# Patient Record
Sex: Female | Born: 1980
Health system: Southern US, Community
[De-identification: ages and names within clinical notes are randomized; demographics above are authoritative.]

## PROBLEM LIST (undated history)

## (undated) DIAGNOSIS — F419 Anxiety disorder, unspecified: Secondary | ICD-10-CM

## (undated) DIAGNOSIS — Z87442 Personal history of urinary calculi: Secondary | ICD-10-CM

## (undated) DIAGNOSIS — J45909 Unspecified asthma, uncomplicated: Secondary | ICD-10-CM

## (undated) DIAGNOSIS — R51 Headache: Secondary | ICD-10-CM

## (undated) DIAGNOSIS — R519 Headache, unspecified: Secondary | ICD-10-CM

## (undated) DIAGNOSIS — F988 Other specified behavioral and emotional disorders with onset usually occurring in childhood and adolescence: Secondary | ICD-10-CM

## (undated) DIAGNOSIS — N926 Irregular menstruation, unspecified: Secondary | ICD-10-CM

## (undated) DIAGNOSIS — D649 Anemia, unspecified: Secondary | ICD-10-CM

## (undated) HISTORY — DX: Other specified behavioral and emotional disorders with onset usually occurring in childhood and adolescence: F98.8

## (undated) HISTORY — DX: Irregular menstruation, unspecified: N92.6

## (undated) HISTORY — PX: ABDOMINAL HYSTERECTOMY: SHX81

## (undated) HISTORY — DX: Anemia, unspecified: D64.9

---

## 2003-12-16 ENCOUNTER — Observation Stay: Payer: Self-pay | Admitting: Obstetrics & Gynecology

## 2003-12-23 ENCOUNTER — Observation Stay: Payer: Self-pay

## 2003-12-23 ENCOUNTER — Inpatient Hospital Stay: Payer: Self-pay

## 2003-12-24 HISTORY — PX: TUBAL LIGATION: SHX77

## 2006-12-24 ENCOUNTER — Emergency Department: Payer: Self-pay | Admitting: Emergency Medicine

## 2010-01-17 ENCOUNTER — Inpatient Hospital Stay: Payer: Self-pay | Admitting: Internal Medicine

## 2011-07-17 ENCOUNTER — Ambulatory Visit: Payer: Self-pay

## 2012-04-03 LAB — HM PAP SMEAR

## 2013-02-06 ENCOUNTER — Emergency Department: Payer: Self-pay | Admitting: Emergency Medicine

## 2013-02-06 LAB — URINALYSIS, COMPLETE
Bilirubin,UR: NEGATIVE
Glucose,UR: NEGATIVE mg/dL (ref 0–75)
Ketone: NEGATIVE
Nitrite: POSITIVE
Ph: 6 (ref 4.5–8.0)
Squamous Epithelial: 5
WBC UR: 589 /HPF (ref 0–5)

## 2013-02-06 LAB — CBC
HCT: 37.1 % (ref 35.0–47.0)
MCH: 29.6 pg (ref 26.0–34.0)
MCHC: 33 g/dL (ref 32.0–36.0)
MCV: 90 fL (ref 80–100)
Platelet: 287 10*3/uL (ref 150–440)
RDW: 12.5 % (ref 11.5–14.5)
WBC: 14.7 10*3/uL — ABNORMAL HIGH (ref 3.6–11.0)

## 2013-02-06 LAB — COMPREHENSIVE METABOLIC PANEL
Albumin: 3.8 g/dL (ref 3.4–5.0)
Anion Gap: 4 — ABNORMAL LOW (ref 7–16)
BUN: 10 mg/dL (ref 7–18)
Calcium, Total: 9.4 mg/dL (ref 8.5–10.1)
Chloride: 105 mmol/L (ref 98–107)
Creatinine: 0.84 mg/dL (ref 0.60–1.30)
Glucose: 103 mg/dL — ABNORMAL HIGH (ref 65–99)
Osmolality: 271 (ref 275–301)
Potassium: 3.4 mmol/L — ABNORMAL LOW (ref 3.5–5.1)
SGOT(AST): 16 U/L (ref 15–37)
SGPT (ALT): 13 U/L (ref 12–78)
Sodium: 136 mmol/L (ref 136–145)
Total Protein: 7.5 g/dL (ref 6.4–8.2)

## 2013-02-09 LAB — URINE CULTURE

## 2013-12-10 ENCOUNTER — Ambulatory Visit: Payer: Self-pay | Admitting: Emergency Medicine

## 2014-10-19 ENCOUNTER — Other Ambulatory Visit: Payer: Self-pay | Admitting: Family Medicine

## 2014-11-12 ENCOUNTER — Other Ambulatory Visit: Payer: Self-pay | Admitting: Family Medicine

## 2014-12-16 ENCOUNTER — Other Ambulatory Visit: Payer: Self-pay | Admitting: Family Medicine

## 2015-01-28 ENCOUNTER — Other Ambulatory Visit: Payer: Self-pay | Admitting: Family Medicine

## 2015-02-10 ENCOUNTER — Encounter: Payer: Self-pay | Admitting: Family Medicine

## 2015-02-10 ENCOUNTER — Ambulatory Visit (INDEPENDENT_AMBULATORY_CARE_PROVIDER_SITE_OTHER): Payer: BLUE CROSS/BLUE SHIELD | Admitting: Family Medicine

## 2015-02-10 VITALS — BP 108/73 | HR 82 | Temp 98.7°F | Ht 66.0 in | Wt 131.0 lb

## 2015-02-10 DIAGNOSIS — R8299 Other abnormal findings in urine: Secondary | ICD-10-CM | POA: Diagnosis not present

## 2015-02-10 DIAGNOSIS — N898 Other specified noninflammatory disorders of vagina: Secondary | ICD-10-CM | POA: Diagnosis not present

## 2015-02-10 DIAGNOSIS — J45909 Unspecified asthma, uncomplicated: Secondary | ICD-10-CM | POA: Diagnosis not present

## 2015-02-10 DIAGNOSIS — R8290 Unspecified abnormal findings in urine: Secondary | ICD-10-CM

## 2015-02-10 LAB — UA/M W/RFLX CULTURE, ROUTINE
BILIRUBIN UA: NEGATIVE
Glucose, UA: NEGATIVE
Ketones, UA: NEGATIVE
Leukocytes, UA: NEGATIVE
NITRITE UA: NEGATIVE
PH UA: 5 (ref 5.0–7.5)
Protein, UA: NEGATIVE
RBC UA: NEGATIVE
SPEC GRAV UA: 1.025 (ref 1.005–1.030)
UUROB: 0.2 mg/dL (ref 0.2–1.0)

## 2015-02-10 MED ORDER — ALBUTEROL SULFATE HFA 108 (90 BASE) MCG/ACT IN AERS: INHALATION_SPRAY | RESPIRATORY_TRACT | Status: DC

## 2015-02-10 MED ORDER — TIOTROPIUM BROMIDE MONOHYDRATE 18 MCG IN CAPS: 18.0000 ug | ORAL_CAPSULE | Freq: Every day | RESPIRATORY_TRACT | Status: DC

## 2015-02-10 NOTE — Assessment & Plan Note (Signed)
Will stop advair as it didn't help. Start spiriva. Continue proair. Recheck 1 month.

## 2015-02-10 NOTE — Progress Notes (Signed)
BP 108/73 mmHg  Pulse 82  Temp(Src) 98.7 F (37.1 C)  Ht 5\' 6"  (1.676 m)  Wt 131 lb (59.421 kg)  BMI 21.15 kg/m2  SpO2 99%  LMP 01/20/2015   Subjective:    Patient ID: Sheila Benton, female    DOB: 1980/08/08, 34 y.o.   MRN: [id number]  HPI: Sheila Benton is a 34 y.o. female  Chief Complaint  Patient presents with  . Follow-up    Still having difficulty breathing  . Other    Possible bladder infection. No dysuria. Urine just is very cloudy.    URINARY SYMPTOMS Duration: couple of months Dysuria: no Urinary frequency: no Urgency: no Small volume voids: no Symptom severity: none Urinary incontinence: no Foul odor: yes Hematuria: no Abdominal pain: no Back pain: yes- occasionally Suprapubic pain/pressure: no Flank pain: no Fever:  no Vomiting: no Relief with cranberry juice: no Relief with pyridium: no Status: stable Previous urinary tract infection: no Recurrent urinary tract infection: no Sexual activity: monogomous History of sexually transmitted disease: no Vaginal discharge: yes Treatments attempted: none   ASTHMA Asthma status: worse Satisfied with current treatment?: no Albuterol/rescue inhaler frequency: nightly, and in AM Dyspnea frequency: several times throughout the day Wheezing frequency: never Cough frequency: never Nocturnal symptom frequency: nightly Limitation of activity: no Current upper respiratory symptoms: no Triggers: laying down at night, dust Last Spirometry: Today Failed/intolerant to following asthma meds: advair Asthma meds in past: advair, proair Aerochamber/spacer use: no Visits to ER or Urgent Care in past year: no Pneumovax: Refused Influenza: Refused  Relevant past medical, surgical, family and social history reviewed and updated as indicated. Interim medical history since our last visit reviewed. Allergies and medications reviewed and updated.  Review of Systems  Constitutional: Negative.   Respiratory: Negative.    Cardiovascular: Negative.   Musculoskeletal: Negative.   Psychiatric/Behavioral: Negative.     Per HPI unless specifically indicated above     Objective:    BP 108/73 mmHg  Pulse 82  Temp(Src) 98.7 F (37.1 C)  Ht 5\' 6"  (1.676 m)  Wt 131 lb (59.421 kg)  BMI 21.15 kg/m2  SpO2 99%  LMP 01/20/2015  Wt Readings from Last 3 Encounters:  02/10/15 131 lb (59.421 kg)    Physical Exam  Constitutional: She is oriented to person, place, and time. She appears well-developed and well-nourished. No distress.  HENT:  Head: Normocephalic and atraumatic.  Right Ear: Hearing and external ear normal.  Left Ear: Hearing and external ear normal.  Nose: Nose normal.  Mouth/Throat: Oropharynx is clear and moist. No oropharyngeal exudate.  Eyes: Conjunctivae, EOM and lids are normal. Pupils are equal, round, and reactive to light. Right eye exhibits no discharge. Left eye exhibits no discharge. No scleral icterus.  Cardiovascular: Normal rate, regular rhythm, normal heart sounds and intact distal pulses.  Exam reveals no gallop and no friction rub.   No murmur heard. Pulmonary/Chest: Effort normal and breath sounds normal. No respiratory distress. She has no wheezes. She has no rales. She exhibits no tenderness.  Musculoskeletal: Normal range of motion.  Neurological: She is alert and oriented to person, place, and time.  Skin: Skin is warm, dry and intact. No rash noted. She is not diaphoretic. No erythema. No pallor.  Psychiatric: She has a normal mood and affect. Her speech is normal and behavior is normal. Judgment and thought content normal. Cognition and memory are normal.  Nursing note and vitals reviewed.   Results for orders placed or performed in visit on  02/10/15  UA/M w/rflx Culture, Routine  Result Value Ref Range   Specific Gravity, UA 1.025 1.005 - 1.030   pH, UA 5.0 5.0 - 7.5   Color, UA Yellow Yellow   Appearance Ur Cloudy (A) Clear   Leukocytes, UA Negative Negative    Protein, UA Negative Negative/Trace   Glucose, UA Negative Negative   Ketones, UA Negative Negative   RBC, UA Negative Negative   Bilirubin, UA Negative Negative   Urobilinogen, Ur 0.2 0.2 - 1.0 mg/dL   Nitrite, UA Negative Negative      Assessment & Plan:   Problem List Items Addressed This Visit      Respiratory   Asthma - Primary    Will stop advair as it didn't help. Start spiriva. Continue proair. Recheck 1 month.       Relevant Medications   tiotropium (SPIRIVA HANDIHALER) 18 MCG inhalation capsule   albuterol (PROAIR HFA) 108 (90 BASE) MCG/ACT inhaler   Other Relevant Orders   Spirometry with Graph (Completed)    Other Visit Diagnoses    Cloudy urine        Negative UA, only in the AM, likely due to dehydration. Monitor. Increase fluids. Call if not better or worse.     Relevant Orders    UA/M w/rflx Culture, Routine (Completed)    Vaginal discharge        Negative wet prep likely natural fluids or possibly irritation from underwear. Monitor. Increase fluids. Call if not better or worse.     Relevant Orders    WET PREP FOR [place], YEAST, CLUE        Follow up plan: Return in about 4 weeks (around 03/10/2015) for follow up lungs.

## 2015-02-11 LAB — WET PREP FOR TRICH, YEAST, CLUE
CLUE CELL EXAM: NEGATIVE
Trichomonas Exam: NEGATIVE
YEAST EXAM: NEGATIVE

## 2015-03-10 ENCOUNTER — Encounter: Payer: Self-pay | Admitting: Family Medicine

## 2015-03-10 ENCOUNTER — Ambulatory Visit (INDEPENDENT_AMBULATORY_CARE_PROVIDER_SITE_OTHER): Payer: BLUE CROSS/BLUE SHIELD | Admitting: Family Medicine

## 2015-03-10 VITALS — BP 105/69 | HR 84 | Temp 98.9°F | Ht 66.2 in | Wt 131.0 lb

## 2015-03-10 DIAGNOSIS — F411 Generalized anxiety disorder: Secondary | ICD-10-CM | POA: Diagnosis not present

## 2015-03-10 DIAGNOSIS — J069 Acute upper respiratory infection, unspecified: Secondary | ICD-10-CM

## 2015-03-10 DIAGNOSIS — J45909 Unspecified asthma, uncomplicated: Secondary | ICD-10-CM

## 2015-03-10 DIAGNOSIS — F419 Anxiety disorder, unspecified: Secondary | ICD-10-CM | POA: Insufficient documentation

## 2015-03-10 MED ORDER — CITALOPRAM HYDROBROMIDE 20 MG PO TABS: 20.0000 mg | ORAL_TABLET | Freq: Every day | ORAL | Status: DC

## 2015-03-10 NOTE — Assessment & Plan Note (Signed)
Under better control. Continue current regimen. Spirometry next visit when she isn't sick.

## 2015-03-10 NOTE — Progress Notes (Signed)
BP 105/69 mmHg  Pulse 84  Temp(Src) 98.9 F (37.2 C)  Ht 5' 6.2" (1.681 m)  Wt 131 lb (59.421 kg)  BMI 21.03 kg/m2  SpO2 99%  LMP 02/16/2015 (Exact Date)   Subjective:    Patient ID: Sheila Benton, female    DOB: Oct 03, 1980, 35 y.o.   MRN: [id number]  HPI: Sheila Benton is a 35 y.o. female  Chief Complaint  Patient presents with  . Asthma   UPPER RESPIRATORY TRACT INFECTION Duration: 1 week Worst symptom: chest pain and congestion Fever: no Cough: yes Shortness of breath: yes Wheezing: no Chest pain: yes Chest tightness: yes Chest congestion: yes Nasal congestion: yes Runny nose: no Post nasal drip: no Sneezing: yes Sore throat: no Swollen glands: no Sinus pressure: yes Headache: yes Face pain: no Toothache: no Ear pain: no  Ear pressure: no  Eyes red/itching:yes Eye drainage/crusting: no  Vomiting: no Rash: no Fatigue: yes Sick contacts: no Strep contacts: no  Context: better Recurrent sinusitis: no Relief with OTC cold/cough medications: no  Treatments attempted: cold/sinus   ASTHMA Asthma status: better Satisfied with current treatment?: yes Albuterol/rescue inhaler frequency: less than she was, couple of times a week Dyspnea frequency: better, but still going on Wheezing frequency: none Nocturnal symptom frequency: never Limitation of activity: no Current upper respiratory symptoms: yes  ANXIETY/STRESS- had a panic attack yesterday, feels like maybe her anxiety is causing her breathing issues than she thought Duration: unsure Anxious mood: yes  Excessive worrying: yes Irritability: yes  Sweating: no Nausea: no Palpitations:yes Hyperventilation: yes Panic attacks: yes, yesterday and usually about 1x a week Agoraphobia: no  Obscessions/compulsions: yes Depressed mood: no GAD 7 : Generalized Anxiety Score 03/10/2015  Nervous, Anxious, on Edge 3  Control/stop worrying 3  Worry too much - different things 2  Trouble relaxing 2  Restless 1   Easily annoyed or irritable 3  Afraid - awful might happen 3  Total GAD 7 Score 17  Anxiety Difficulty Somewhat difficult   Anhedonia: no Weight changes: no Insomnia: no   Hypersomnia: yes Fatigue/loss of energy: yes Feelings of worthlessness: no Feelings of guilt: no Impaired concentration/indecisiveness: no Suicidal ideations: no  Crying spells: no Recent Stressors/Life Changes: yes, has been moving  Relevant past medical, surgical, family and social history reviewed and updated as indicated. Interim medical history since our last visit reviewed. Allergies and medications reviewed and updated.  Review of Systems  Constitutional: Negative.   HENT: Positive for congestion, nosebleeds, postnasal drip and rhinorrhea. Negative for dental problem, drooling, ear discharge, ear pain, facial swelling, hearing loss, mouth sores, sinus pressure, sneezing, sore throat, tinnitus, trouble swallowing and voice change.   Respiratory: Positive for cough, chest tightness and shortness of breath. Negative for wheezing and stridor.   Cardiovascular: Positive for chest pain. Negative for palpitations and leg swelling.  Psychiatric/Behavioral: Negative.     Per HPI unless specifically indicated above     Objective:    BP 105/69 mmHg  Pulse 84  Temp(Src) 98.9 F (37.2 C)  Ht 5' 6.2" (1.681 m)  Wt 131 lb (59.421 kg)  BMI 21.03 kg/m2  SpO2 99%  LMP 02/16/2015 (Exact Date)  Wt Readings from Last 3 Encounters:  03/10/15 131 lb (59.421 kg)  02/10/15 131 lb (59.421 kg)    Physical Exam  Constitutional: She is oriented to person, place, and time. She appears well-developed and well-nourished. No distress.  HENT:  Head: Normocephalic and atraumatic.  Right Ear: Hearing and external ear normal.  Left  Ear: Hearing and external ear normal.  Nose: Mucosal edema, rhinorrhea and sinus tenderness present. No nose lacerations, nasal deformity, septal deviation or nasal septal hematoma. No epistaxis.   No foreign bodies.  Mouth/Throat: Uvula is midline, oropharynx is clear and moist and mucous membranes are normal. No oropharyngeal exudate.  Swelling and mucous in L maxillary region  Eyes: Conjunctivae, EOM and lids are normal. Pupils are equal, round, and reactive to light. Right eye exhibits no discharge. Left eye exhibits no discharge. No scleral icterus.  Neck: Normal range of motion. Neck supple. No JVD present. No tracheal deviation present. No thyromegaly present.  Cardiovascular: Normal rate, regular rhythm, normal heart sounds and intact distal pulses.  Exam reveals no gallop and no friction rub.   No murmur heard. Pulmonary/Chest: Effort normal and breath sounds normal. No stridor. No respiratory distress. She has no wheezes. She has no rales. She exhibits no tenderness.  Musculoskeletal: Normal range of motion.  Lymphadenopathy:    She has cervical adenopathy.  Neurological: She is alert and oriented to person, place, and time.  Skin: Skin is warm, dry and intact. No rash noted. No erythema. No pallor.  Psychiatric: She has a normal mood and affect. Her speech is normal and behavior is normal. Judgment and thought content normal. Cognition and memory are normal.  Nursing note and vitals reviewed.   Results for orders placed or performed in visit on 02/10/15  WET PREP FOR [place], YEAST, CLUE  Result Value Ref Range   Trichomonas Exam Negative Negative   Yeast Exam Negative Negative   Clue Cell Exam Negative Negative  UA/M w/rflx Culture, Routine  Result Value Ref Range   Specific Gravity, UA 1.025 1.005 - 1.030   pH, UA 5.0 5.0 - 7.5   Color, UA Yellow Yellow   Appearance Ur Cloudy (A) Clear   Leukocytes, UA Negative Negative   Protein, UA Negative Negative/Trace   Glucose, UA Negative Negative   Ketones, UA Negative Negative   RBC, UA Negative Negative   Bilirubin, UA Negative Negative   Urobilinogen, Ur 0.2 0.2 - 1.0 mg/dL   Nitrite, UA Negative Negative       Assessment & Plan:   Problem List Items Addressed This Visit      Respiratory   Asthma - Primary    Under better control. Continue current regimen. Spirometry next visit when she isn't sick.         Other   Anxiety disorder    Didn't do well with zoloft in the past. Will treat with celexa. Discussed risks/benefits and side effects today. Recheck in 1 month. Call with any problems       Other Visit Diagnoses    Upper respiratory infection        Continue symptomatic treatment, if not better by middle of next week will treat. Continue to monitor.         Follow up plan: Return in about 4 weeks (around 04/07/2015) for Anxiety follow up and spiro.

## 2015-03-10 NOTE — Assessment & Plan Note (Signed)
Didn't do well with zoloft in the past. Will treat with celexa. Discussed risks/benefits and side effects today. Recheck in 1 month. Call with any problems

## 2015-03-30 ENCOUNTER — Other Ambulatory Visit: Payer: Self-pay | Admitting: Family Medicine

## 2015-04-11 ENCOUNTER — Encounter: Payer: Self-pay | Admitting: Family Medicine

## 2015-04-11 ENCOUNTER — Ambulatory Visit (INDEPENDENT_AMBULATORY_CARE_PROVIDER_SITE_OTHER): Payer: BLUE CROSS/BLUE SHIELD | Admitting: Family Medicine

## 2015-04-11 VITALS — BP 115/75 | HR 80 | Temp 98.6°F | Ht 65.3 in | Wt 124.0 lb

## 2015-04-11 DIAGNOSIS — N3001 Acute cystitis with hematuria: Secondary | ICD-10-CM | POA: Diagnosis not present

## 2015-04-11 DIAGNOSIS — J4520 Mild intermittent asthma, uncomplicated: Secondary | ICD-10-CM

## 2015-04-11 DIAGNOSIS — R1011 Right upper quadrant pain: Secondary | ICD-10-CM

## 2015-04-11 MED ORDER — ALBUTEROL SULFATE HFA 108 (90 BASE) MCG/ACT IN AERS: 1.0000 | INHALATION_SPRAY | RESPIRATORY_TRACT | Status: DC | PRN

## 2015-04-11 MED ORDER — CIPROFLOXACIN HCL 500 MG PO TABS: 500.0000 mg | ORAL_TABLET | Freq: Two times a day (BID) | ORAL | Status: DC

## 2015-04-11 NOTE — Assessment & Plan Note (Signed)
Under good control. Spiriva too expensive, will try Bevespi. Sample given today. If she likes it, she will call and we will send in Rx.

## 2015-04-11 NOTE — Progress Notes (Signed)
BP 115/75 mmHg  Pulse 80  Temp(Src) 98.6 F (37 C)  Ht 5' 5.3" (1.659 m)  Wt 124 lb (56.246 kg)  BMI 20.44 kg/m2  SpO2 98%  LMP 04/08/2015 (Exact Date)   Subjective:    Patient ID: Sheila Benton, female    DOB: 1980/10/25, 36 y.o.   MRN: [id number]  HPI: Sheila Benton is a 35 y.o. female  Chief Complaint  Patient presents with  . Asthma  . Abdominal Pain    right side   ASTHMA Asthma status: better Satisfied with current treatment?: yes Albuterol/rescue inhaler frequency: couple times a day Dyspnea frequency: at night Wheezing frequency: couple of times a day Cough frequency: mainly at night Nocturnal symptom frequency: few times a week Limitation of activity: no Current upper respiratory symptoms: no Triggers: at night Last Spirometry: today  Failed/intolerant to following asthma meds: advair  ABDOMINAL PAIN  Duration: last night and again today Onset: sudden Severity: severe Quality: sharp and stabbing Location:  RUQ  Episode duration: 5 minutes Radiation: no Frequency: 2x in the past day Alleviating factors:  Aggravating factors: Status: better Treatments attempted: none Fever: no Nausea: no Vomiting: no Weight loss: no Decreased appetite: no Diarrhea: yes- last week Constipation: no Blood in stool: no Heartburn: no Jaundice: no Rash: no Dysuria/urinary frequency: no Hematuria: no History of sexually transmitted disease: no Recurrent NSAID use: no   Relevant past medical, surgical, family and social history reviewed and updated as indicated. Interim medical history since our last visit reviewed. Allergies and medications reviewed and updated.  Review of Systems  Constitutional: Negative.   HENT: Negative.   Respiratory: Negative.   Cardiovascular: Negative.   Gastrointestinal: Positive for abdominal pain and diarrhea. Negative for nausea, vomiting, constipation, blood in stool, abdominal distention, anal bleeding and rectal pain.   Genitourinary: Negative.   Musculoskeletal: Positive for back pain. Negative for myalgias, joint swelling, arthralgias, gait problem, neck pain and neck stiffness.  Psychiatric/Behavioral: Negative.     Per HPI unless specifically indicated above     Objective:    BP 115/75 mmHg  Pulse 80  Temp(Src) 98.6 F (37 C)  Ht 5' 5.3" (1.659 m)  Wt 124 lb (56.246 kg)  BMI 20.44 kg/m2  SpO2 98%  LMP 04/08/2015 (Exact Date)  Wt Readings from Last 3 Encounters:  04/11/15 124 lb (56.246 kg)  03/10/15 131 lb (59.421 kg)  02/10/15 131 lb (59.421 kg)    Physical Exam  Constitutional: She is oriented to person, place, and time. She appears well-developed and well-nourished. No distress.  HENT:  Head: Normocephalic and atraumatic.  Right Ear: Hearing normal.  Left Ear: Hearing normal.  Nose: Nose normal.  Eyes: Conjunctivae and lids are normal. Right eye exhibits no discharge. Left eye exhibits no discharge. No scleral icterus.  Cardiovascular: Normal rate, regular rhythm, normal heart sounds and intact distal pulses.  Exam reveals no gallop and no friction rub.   No murmur heard. Pulmonary/Chest: Effort normal and breath sounds normal. No respiratory distress. She has no wheezes. She has no rales. She exhibits no tenderness.  Abdominal: Soft. Bowel sounds are normal. She exhibits no distension and no mass. There is no hepatosplenomegaly. There is tenderness in the right upper quadrant. There is positive Murphy's sign. There is no rigidity, no rebound, no guarding, no CVA tenderness and no tenderness at McBurney's point.  Musculoskeletal: Normal range of motion.  Neurological: She is alert and oriented to person, place, and time.  Skin: Skin is warm, dry and intact.  No rash noted. No erythema. No pallor.  Psychiatric: She has a normal mood and affect. Her speech is normal and behavior is normal. Judgment and thought content normal. Cognition and memory are normal.  Nursing note and vitals  reviewed.   Results for orders placed or performed in visit on 02/10/15  WET PREP FOR [place], YEAST, CLUE  Result Value Ref Range   Trichomonas Exam Negative Negative   Yeast Exam Negative Negative   Clue Cell Exam Negative Negative  UA/M w/rflx Culture, Routine  Result Value Ref Range   Specific Gravity, UA 1.025 1.005 - 1.030   pH, UA 5.0 5.0 - 7.5   Color, UA Yellow Yellow   Appearance Ur Cloudy (A) Clear   Leukocytes, UA Negative Negative   Protein, UA Negative Negative/Trace   Glucose, UA Negative Negative   Ketones, UA Negative Negative   RBC, UA Negative Negative   Bilirubin, UA Negative Negative   Urobilinogen, Ur 0.2 0.2 - 1.0 mg/dL   Nitrite, UA Negative Negative      Assessment & Plan:   Problem List Items Addressed This Visit      Respiratory   Asthma - Primary    Under good control. Spiriva too expensive, will try Bevespi. Sample given today. If she likes it, she will call and we will send in Rx.       Relevant Medications   albuterol (PROAIR HFA) 108 (90 Base) MCG/ACT inhaler   Other Relevant Orders   Spirometry with Graph (Completed)    Other Visit Diagnoses    RUQ abdominal pain        Pain in RUQ, concern for gall bladder- will get labs and [place]. ? Kindey stone, checking urine. Await results. Call if not getting better or getting worse.     Relevant Orders    CBC With Differential/Platelet    Comprehensive metabolic panel    UA/M w/rflx Culture, Routine    US Abdomen Limited RUQ    Acute cystitis with hematuria        UA very dirty- will treat with cipro. Call if not getting better or getting worse.         Follow up plan: Return in about 6 months (around 10/09/2015) for and by phone pending results. Marland Kitchen

## 2015-04-12 ENCOUNTER — Telehealth: Payer: Self-pay | Admitting: Family Medicine

## 2015-04-12 LAB — COMPREHENSIVE METABOLIC PANEL
A/G RATIO: 1.9 (ref 1.1–2.5)
ALBUMIN: 4.4 g/dL (ref 3.5–5.5)
ALT: 8 IU/L (ref 0–32)
AST: 13 IU/L (ref 0–40)
Alkaline Phosphatase: 59 IU/L (ref 39–117)
BILIRUBIN TOTAL: 0.3 mg/dL (ref 0.0–1.2)
BUN / CREAT RATIO: 13 (ref 8–20)
BUN: 9 mg/dL (ref 6–20)
CALCIUM: 8.9 mg/dL (ref 8.7–10.2)
CO2: 24 mmol/L (ref 18–29)
Chloride: 102 mmol/L (ref 96–106)
Creatinine, Ser: 0.72 mg/dL (ref 0.57–1.00)
GFR, EST AFRICAN AMERICAN: 125 mL/min/{1.73_m2} (ref >59)
GFR, EST NON AFRICAN AMERICAN: 109 mL/min/{1.73_m2} (ref >59)
Globulin, Total: 2.3 g/dL (ref 1.5–4.5)
Glucose: 94 mg/dL (ref 65–99)
POTASSIUM: 3.9 mmol/L (ref 3.5–5.2)
Sodium: 140 mmol/L (ref 134–144)
TOTAL PROTEIN: 6.7 g/dL (ref 6.0–8.5)

## 2015-04-12 NOTE — Telephone Encounter (Signed)
Please let Sheila Benton know that her labs came back normal except her urine. And that I hope she's feeling better with her medicine.

## 2015-04-12 NOTE — Telephone Encounter (Signed)
Patient notified

## 2015-04-15 ENCOUNTER — Telehealth: Payer: Self-pay | Admitting: Family Medicine

## 2015-04-15 LAB — UA/M W/RFLX CULTURE, ROUTINE
Bilirubin, UA: NEGATIVE
Glucose, UA: NEGATIVE
NITRITE UA: POSITIVE — AB
Specific Gravity, UA: 1.015 (ref 1.005–1.030)
UUROB: 2 mg/dL — AB (ref 0.2–1.0)
pH, UA: 8.5 — ABNORMAL HIGH (ref 5.0–7.5)

## 2015-04-15 LAB — CBC WITH DIFFERENTIAL/PLATELET
HEMATOCRIT: 35.1 % (ref 34.0–46.6)
HEMOGLOBIN: 12.3 g/dL (ref 11.1–15.9)
Lymphocytes Absolute: 2.7 10*3/uL (ref 0.7–3.1)
Lymphs: 34 %
MCH: 32 pg (ref 26.6–33.0)
MCHC: 35 g/dL (ref 31.5–35.7)
MCV: 91 fL (ref 79–97)
MID (ABSOLUTE): 1 10*3/uL (ref 0.1–1.6)
MID: 13 %
Neutrophils Absolute: 4.4 10*3/uL (ref 1.4–7.0)
Neutrophils: 54 %
PLATELETS: 275 10*3/uL (ref 150–379)
RBC: 3.84 x10E6/uL (ref 3.77–5.28)
RDW: 12.6 % (ref 12.3–15.4)
WBC: 8.1 10*3/uL (ref 3.4–10.8)

## 2015-04-15 LAB — MICROSCOPIC EXAMINATION

## 2015-04-15 LAB — URINE CULTURE, REFLEX

## 2015-04-15 MED ORDER — NITROFURANTOIN MONOHYD MACRO 100 MG PO CAPS: 100.0000 mg | ORAL_CAPSULE | Freq: Two times a day (BID) | ORAL | Status: DC

## 2015-04-15 NOTE — Telephone Encounter (Signed)
Patient notified

## 2015-04-15 NOTE — Telephone Encounter (Signed)
Please let her know that her urine was resistant to the antibiotic that we sent over. I've sent over a new one. Please let me know if there is anything else she needs.

## 2015-04-21 ENCOUNTER — Telehealth: Payer: Self-pay | Admitting: Family Medicine

## 2015-04-21 ENCOUNTER — Ambulatory Visit
Admission: RE | Admit: 2015-04-21 | Discharge: 2015-04-21 | Disposition: A | Discharge status: Home / Self Care | Payer: BLUE CROSS/BLUE SHIELD | Source: Ambulatory Visit | Attending: Family Medicine | Admitting: Family Medicine

## 2015-04-21 DIAGNOSIS — K838 Other specified diseases of biliary tract: Secondary | ICD-10-CM | POA: Insufficient documentation

## 2015-04-21 DIAGNOSIS — R1011 Right upper quadrant pain: Secondary | ICD-10-CM | POA: Diagnosis not present

## 2015-04-21 DIAGNOSIS — Z01818 Encounter for other preprocedural examination: Secondary | ICD-10-CM

## 2015-04-21 DIAGNOSIS — R935 Abnormal findings on diagnostic imaging of other abdominal regions, including retroperitoneum: Secondary | ICD-10-CM | POA: Diagnosis not present

## 2015-04-21 NOTE — Telephone Encounter (Signed)
Patient will call us on Monday and give an update on how she feels.

## 2015-04-21 NOTE — Telephone Encounter (Signed)
Called to give [facility] her results. No gall stones, but may be a stone in her bile duct- They recommend checking a CT to see if there is something going on. If she still has pain after being treated for the UTI, we will order this. If she is 100% pain free, we can hold off on this for now and consider it in the future. OK to tell her this if she calls back.

## 2015-04-25 ENCOUNTER — Other Ambulatory Visit: Payer: BLUE CROSS/BLUE SHIELD

## 2015-04-25 DIAGNOSIS — Z01818 Encounter for other preprocedural examination: Secondary | ICD-10-CM

## 2015-04-25 LAB — PREGNANCY, URINE: Preg Test, Ur: NEGATIVE

## 2015-04-25 NOTE — Telephone Encounter (Signed)
Patient notified. She will come in this afternoon to have blood work done.  Keri: Can you get the CT scan scheduled for tomorrow afternoon if possible

## 2015-04-25 NOTE — Telephone Encounter (Signed)
Patient is scheduled for tomorrow 2/28 at 1:30 at outpatient imaging center off of [facility], arrive no later than 1:15. No food or drink 4 hours prior (9:30am) and patient must go either this afternoon or tomorrow morning to pick up the contrast for her to drink.  I tired calling patient, no answer. I left this information on her voicemail. Will try to still call again tomorrow morning.

## 2015-04-25 NOTE — Telephone Encounter (Signed)
Patient is calling back regarding pain she is still having in her stomach and antibiotic is not working and the symptoms are still there and worse. She is returning Dr. Wynetta Emery call, thanks.

## 2015-04-25 NOTE — Telephone Encounter (Signed)
Forward to provider to see what the next step is.

## 2015-04-25 NOTE — Telephone Encounter (Signed)
Needs labwork and CT- can we please get this scheduled

## 2015-04-26 ENCOUNTER — Ambulatory Visit
Admission: RE | Admit: 2015-04-26 | Discharge: 2015-04-26 | Disposition: A | Discharge status: Home / Self Care | Payer: BLUE CROSS/BLUE SHIELD | Source: Ambulatory Visit | Attending: Family Medicine | Admitting: Family Medicine

## 2015-04-26 ENCOUNTER — Telehealth: Payer: Self-pay | Admitting: Family Medicine

## 2015-04-26 DIAGNOSIS — Q433 Congenital malformations of intestinal fixation: Secondary | ICD-10-CM | POA: Diagnosis not present

## 2015-04-26 DIAGNOSIS — N2889 Other specified disorders of kidney and ureter: Secondary | ICD-10-CM | POA: Diagnosis not present

## 2015-04-26 DIAGNOSIS — K838 Other specified diseases of biliary tract: Secondary | ICD-10-CM

## 2015-04-26 DIAGNOSIS — K5710 Diverticulosis of small intestine without perforation or abscess without bleeding: Secondary | ICD-10-CM | POA: Insufficient documentation

## 2015-04-26 HISTORY — DX: Unspecified asthma, uncomplicated: J45.909

## 2015-04-26 LAB — BASIC METABOLIC PANEL
BUN/Creatinine Ratio: 15 (ref 8–20)
BUN: 12 mg/dL (ref 6–20)
CHLORIDE: 102 mmol/L (ref 96–106)
CO2: 20 mmol/L (ref 18–29)
CREATININE: 0.8 mg/dL (ref 0.57–1.00)
Calcium: 9.1 mg/dL (ref 8.7–10.2)
GFR calc Af Amer: 110 mL/min/{1.73_m2} (ref >59)
GFR calc non Af Amer: 95 mL/min/{1.73_m2} (ref >59)
GLUCOSE: 84 mg/dL (ref 65–99)
POTASSIUM: 4.7 mmol/L (ref 3.5–5.2)
SODIUM: 139 mmol/L (ref 134–144)

## 2015-04-26 MED ORDER — IOHEXOL 300 MG/ML  SOLN
100.0000 mL | Freq: Once | INTRAMUSCULAR | Status: AC | PRN
  Administered 2015-04-26: 100 mL via INTRAVENOUS

## 2015-04-26 NOTE — Telephone Encounter (Signed)
CT changed to just CT of abdomen w/ contrast. Insurance will cover abdomen, but not pelvis. Dr. Wynetta Emery will have to call [phone], for peer to peer review to expedite the process.   Katina notified patient to pick up contrast and of appointment.

## 2015-04-26 NOTE — Telephone Encounter (Signed)
Called and LMOM. Will call and discuss results tomorrow.

## 2015-04-26 NOTE — Telephone Encounter (Signed)
Called to do peer-to-peer. Approval # [id number] valid 2/28-3/29/17.

## 2015-04-27 DIAGNOSIS — Q433 Congenital malformations of intestinal fixation: Secondary | ICD-10-CM | POA: Insufficient documentation

## 2015-04-27 NOTE — Telephone Encounter (Signed)
Discussed results with Yakira. Likely constipation. Will start increased fiber and fluids and PRN miralax. Call if not feeling better.

## 2015-04-28 ENCOUNTER — Other Ambulatory Visit: Payer: Self-pay | Admitting: Family Medicine

## 2015-06-09 ENCOUNTER — Other Ambulatory Visit: Payer: Self-pay | Admitting: Family Medicine

## 2015-06-27 ENCOUNTER — Other Ambulatory Visit: Payer: Self-pay | Admitting: Family Medicine

## 2015-07-07 ENCOUNTER — Other Ambulatory Visit: Payer: Self-pay | Admitting: Family Medicine

## 2015-07-15 ENCOUNTER — Other Ambulatory Visit: Payer: Self-pay | Admitting: Family Medicine

## 2015-07-15 NOTE — Telephone Encounter (Signed)
Please have her make an appointment. She is going through her rescue inhaler way too quickly and we need to adjust her medication.

## 2015-07-15 NOTE — Telephone Encounter (Signed)
Patient notified, appointment scheduled

## 2015-07-19 ENCOUNTER — Ambulatory Visit (INDEPENDENT_AMBULATORY_CARE_PROVIDER_SITE_OTHER): Payer: BLUE CROSS/BLUE SHIELD | Admitting: Family Medicine

## 2015-07-19 ENCOUNTER — Other Ambulatory Visit: Payer: Self-pay | Admitting: Family Medicine

## 2015-07-19 ENCOUNTER — Encounter: Payer: Self-pay | Admitting: Family Medicine

## 2015-07-19 VITALS — BP 101/64 | HR 85 | Temp 99.8°F | Ht 66.0 in | Wt 123.0 lb

## 2015-07-19 DIAGNOSIS — J4540 Moderate persistent asthma, uncomplicated: Secondary | ICD-10-CM

## 2015-07-19 DIAGNOSIS — R300 Dysuria: Secondary | ICD-10-CM | POA: Diagnosis not present

## 2015-07-19 DIAGNOSIS — D485 Neoplasm of uncertain behavior of skin: Secondary | ICD-10-CM

## 2015-07-19 DIAGNOSIS — J4520 Mild intermittent asthma, uncomplicated: Secondary | ICD-10-CM

## 2015-07-19 DIAGNOSIS — F411 Generalized anxiety disorder: Secondary | ICD-10-CM

## 2015-07-19 LAB — UA/M W/RFLX CULTURE, ROUTINE
Bilirubin, UA: NEGATIVE
Glucose, UA: NEGATIVE
Ketones, UA: NEGATIVE
LEUKOCYTES UA: NEGATIVE
NITRITE UA: NEGATIVE
PH UA: 7 (ref 5.0–7.5)
Protein, UA: NEGATIVE
RBC UA: NEGATIVE
Specific Gravity, UA: 1.015 (ref 1.005–1.030)
Urobilinogen, Ur: 1 mg/dL (ref 0.2–1.0)

## 2015-07-19 MED ORDER — CITALOPRAM HYDROBROMIDE 40 MG PO TABS: 40.0000 mg | ORAL_TABLET | Freq: Every day | ORAL | Status: DC

## 2015-07-19 MED ORDER — CIPROFLOXACIN HCL 500 MG PO TABS: 500.0000 mg | ORAL_TABLET | Freq: Two times a day (BID) | ORAL | Status: DC

## 2015-07-19 MED ORDER — ALBUTEROL SULFATE HFA 108 (90 BASE) MCG/ACT IN AERS: INHALATION_SPRAY | RESPIRATORY_TRACT | Status: DC

## 2015-07-19 MED ORDER — HYDROXYZINE HCL 25 MG PO TABS: 25.0000 mg | ORAL_TABLET | Freq: Three times a day (TID) | ORAL | Status: DC | PRN

## 2015-07-19 NOTE — Assessment & Plan Note (Signed)
Not under good control. Will increase her celexa and start hydroxyzine for break through. Recheck in 1 month. Call if not getting better or getting worse.

## 2015-07-19 NOTE — Progress Notes (Signed)
BP 101/64 mmHg  Pulse 85  Temp(Src) 99.8 F (37.7 C)  Ht 5\' 6"  (1.676 m)  Wt 123 lb (55.792 kg)  BMI 19.86 kg/m2  SpO2 98%  LMP 06/26/2015 (Exact Date)   Subjective:    Patient ID: Sheila Benton, female    DOB: 1980-08-22, 35 y.o.   MRN: [id number]  HPI: Sheila Benton is a 35 y.o. female  Chief Complaint  Patient presents with  . Medication Management  . Anxiety    Wants to possibly increase anxiety medication. Patient thinks this is what's causing her asthma to flare.   . Asthma   ANXIETY/STRESS Duration:exacerbated Anxious mood: yes  Excessive worrying: yes Irritability: yes  Sweating: yes Nausea: yes Palpitations:yes Hyperventilation: no Panic attacks: yes Agoraphobia: no  Obscessions/compulsions: no Depressed mood: no Depression screen PHQ 2/9 07/19/2015  Decreased Interest 2  Down, Depressed, Hopeless 0  PHQ - 2 Score 2  Altered sleeping 3  Tired, decreased energy 3  Change in appetite 3  Feeling bad or failure about yourself  0  Trouble concentrating 3  Moving slowly or fidgety/restless 0  Suicidal thoughts 0  PHQ-9 Score 14    GAD 7 : Generalized Anxiety Score 07/19/2015 03/10/2015  Nervous, Anxious, on Edge 3 3  Control/stop worrying 3 3  Worry too much - different things 3 2  Trouble relaxing 3 2  Restless 3 1  Easily annoyed or irritable 3 3  Afraid - awful might happen 1 3  Total GAD 7 Score 19 17  Anxiety Difficulty Somewhat difficult Somewhat difficult   Anhedonia: no Weight changes: no Insomnia: yes hard to fall asleep  Hypersomnia: no Fatigue/loss of energy: yes Feelings of worthlessness: yes Feelings of guilt: yes Impaired concentration/indecisiveness: yes Suicidal ideations: no  Crying spells: yes Recent Stressors/Life Changes: yes   Relationship problems: no   Family stress: yes     Financial stress: yes    Job stress: no    Recent death/loss: no  ASTHMA Asthma status: exacerbated Satisfied with current treatment?:  no Albuterol/rescue inhaler frequency: several times a day Dyspnea frequency: several times a day Wheezing frequency: several times a day Cough frequency:  Several times a day Nocturnal symptom frequency: a couple of times a week Limitation of activity: yes Current upper respiratory symptoms: no Last Spirometry: today- moderate airway obstruction Failed/intolerant to following asthma meds:  Asthma meds in past: spiriva Aerochamber/spacer use: no Visits to ER or Urgent Care in past year: no Pneumovax: Not up to Date Influenza: Not up to Date  URINARY SYMPTOMS Duration: a couple of weeks Dysuria: yes Urinary frequency: yes Urgency: yes Small volume voids: yes Symptom severity: minor Urinary incontinence: no Foul odor: no Hematuria: no Abdominal pain: no Back pain: yes Suprapubic pain/pressure: no Flank pain: no Fever:  no Vomiting: no Relief with cranberry juice: no Relief with pyridium: no Status: stable Previous urinary tract infection: yes Recurrent urinary tract infection: no Sexual activity: monogomous History of sexually transmitted disease: no Vaginal discharge: no Treatments attempted: cranberry and increasing fluids   SKIN LESION Duration: chronic Location:  L shoulder Painful: no Itching: yes Onset: gradual Context: bigger Associated signs and symptoms: gets caught on her shirt History of skin cancer: no History of precancerous skin lesions: no Family history of skin cancer: no  Relevant past medical, surgical, family and social history reviewed and updated as indicated. Interim medical history since our last visit reviewed. Allergies and medications reviewed and updated.  Review of Systems  Constitutional: Negative.  Respiratory: Positive for cough, shortness of breath and wheezing. Negative for apnea, choking, chest tightness and stridor.   Cardiovascular: Negative.   Psychiatric/Behavioral: Positive for behavioral problems, sleep disturbance,  dysphoric mood, decreased concentration and agitation. Negative for suicidal ideas, hallucinations, confusion and self-injury. The patient is nervous/anxious. The patient is not hyperactive.     Per HPI unless specifically indicated above     Objective:    BP 101/64 mmHg  Pulse 85  Temp(Src) 99.8 F (37.7 C)  Ht 5\' 6"  (1.676 m)  Wt 123 lb (55.792 kg)  BMI 19.86 kg/m2  SpO2 98%  LMP 06/26/2015 (Exact Date)  Wt Readings from Last 3 Encounters:  07/19/15 123 lb (55.792 kg)  04/11/15 124 lb (56.246 kg)  03/10/15 131 lb (59.421 kg)    Physical Exam  Constitutional: She is oriented to person, place, and time. She appears well-developed and well-nourished. No distress.  HENT:  Head: Normocephalic and atraumatic.  Right Ear: Hearing normal.  Left Ear: Hearing normal.  Nose: Nose normal.  Eyes: Conjunctivae and lids are normal. Right eye exhibits no discharge. Left eye exhibits no discharge. No scleral icterus.  Cardiovascular: Normal rate, regular rhythm, normal heart sounds and intact distal pulses.  Exam reveals no gallop and no friction rub.   No murmur heard. Pulmonary/Chest: Effort normal and breath sounds normal. No respiratory distress. She has no wheezes. She has no rales. She exhibits no tenderness.  Musculoskeletal: Normal range of motion.  Neurological: She is alert and oriented to person, place, and time.  Skin: Skin is warm, dry and intact. No rash noted. She is not diaphoretic. No erythema. No pallor.  0.5 cm hyperpigmented raised lesion on L shoulder above scapular spine with area of increased pigment within it  Psychiatric: She has a normal mood and affect. Her speech is normal and behavior is normal. Judgment and thought content normal. Cognition and memory are normal.  Nursing note and vitals reviewed.   Results for orders placed or performed in visit on [id number]  Basic metabolic panel  Result Value Ref Range   Glucose 84 65 - 99 mg/dL   BUN 12 6 - 20 mg/dL    Creatinine, Ser 0.80 0.57 - 1.00 mg/dL   GFR calc non Af Amer 95 >59 mL/min/1.73   GFR calc Af Amer 110 >59 mL/min/1.73   BUN/Creatinine Ratio 15 8 - 20   Sodium 139 134 - 144 mmol/L   Potassium 4.7 3.5 - 5.2 mmol/L   Chloride 102 96 - 106 mmol/L   CO2 20 18 - 29 mmol/L   Calcium 9.1 8.7 - 10.2 mg/dL  Pregnancy, urine  Result Value Ref Range   Preg Test, Ur Negative Negative      Assessment & Plan:   Problem List Items Addressed This Visit      Respiratory   Asthma    Will get her onto breo- sample given today. Recheck 1 month to see how she's doing.       Relevant Medications   albuterol (PROAIR HFA) 108 (90 Base) MCG/ACT inhaler   Other Relevant Orders   Spirometry with Graph (Completed)     Other   Anxiety disorder - Primary    Not under good control. Will increase her celexa and start hydroxyzine for break through. Recheck in 1 month. Call if not getting better or getting worse.        Other Visit Diagnoses    Dysuria        UA negative today,  however with symptom severity- will treat. If not getting better she will let us know    Relevant Orders    UA/M w/rflx Culture, Routine    Neoplasm of uncertain behavior of skin        Will do shave biopsy ASAP.         Follow up plan: Return ASAP for shave biopsy, for 1 month for follow up breathing and mood.

## 2015-07-19 NOTE — Assessment & Plan Note (Signed)
Will get her onto breo- sample given today. Recheck 1 month to see how she's doing.

## 2015-07-21 ENCOUNTER — Telehealth: Payer: Self-pay

## 2015-07-21 NOTE — Telephone Encounter (Signed)
Please call and get patient scheduled for paperwork, received a voicemail stating that she needs FMLA paperwork filled out before June 8

## 2015-08-02 ENCOUNTER — Ambulatory Visit (INDEPENDENT_AMBULATORY_CARE_PROVIDER_SITE_OTHER): Payer: BLUE CROSS/BLUE SHIELD | Admitting: Family Medicine

## 2015-08-02 ENCOUNTER — Encounter: Payer: Self-pay | Admitting: Family Medicine

## 2015-08-02 VITALS — BP 101/67 | HR 85 | Temp 98.9°F | Wt 122.0 lb

## 2015-08-02 DIAGNOSIS — F411 Generalized anxiety disorder: Secondary | ICD-10-CM

## 2015-08-02 DIAGNOSIS — J4540 Moderate persistent asthma, uncomplicated: Secondary | ICD-10-CM

## 2015-08-02 MED ORDER — FLUTICASONE FUROATE-VILANTEROL 200-25 MCG/INH IN AEPB: 1.0000 | INHALATION_SPRAY | Freq: Every day | RESPIRATORY_TRACT | Status: DC

## 2015-08-02 NOTE — Progress Notes (Signed)
BP 101/67 mmHg  Pulse 85  Temp(Src) 98.9 F (37.2 C)  Wt 122 lb (55.339 kg)  SpO2 97%  LMP 07/23/2015 (Exact Date)   Subjective:    Patient ID: Sheila Benton, female    DOB: 08/08/1980, 35 y.o.   MRN: [id number]  HPI: Elleanor Benton is a 35 y.o. female  Chief Complaint  Patient presents with  . Anxiety    Patient needs to have FMLA paperwork filled out, just incase she has an attack at work, so that she doesnt loose her job   ANXIETY/STRESS Duration:better Anxious mood: yes  Excessive worrying: yes Irritability: yes  Sweating: no Nausea: no Palpitations:no Hyperventilation: no Panic attacks: yes Agoraphobia: no  Obscessions/compulsions: no Depressed mood: no Depression screen PHQ 2/9 07/19/2015  Decreased Interest 2  Down, Depressed, Hopeless 0  PHQ - 2 Score 2  Altered sleeping 3  Tired, decreased energy 3  Change in appetite 3  Feeling bad or failure about yourself  0  Trouble concentrating 3  Moving slowly or fidgety/restless 0  Suicidal thoughts 0  PHQ-9 Score 14  Anhedonia: no Weight changes: no Insomnia: no   Hypersomnia: no Fatigue/loss of energy: no Feelings of worthlessness: no Feelings of guilt: no Impaired concentration/indecisiveness: no Suicidal ideations: no  Crying spells: no Recent Stressors/Life Changes: yes   Really liked the breo. Thought that it worked well for her. Feeling well with no concerns.   Relevant past medical, surgical, family and social history reviewed and updated as indicated. Interim medical history since our last visit reviewed. Allergies and medications reviewed and updated.  Review of Systems  Constitutional: Negative.   Respiratory: Negative.   Psychiatric/Behavioral: Negative.     Per HPI unless specifically indicated above     Objective:    BP 101/67 mmHg  Pulse 85  Temp(Src) 98.9 F (37.2 C)  Wt 122 lb (55.339 kg)  SpO2 97%  LMP 07/23/2015 (Exact Date)  Wt Readings from Last 3 Encounters:  08/02/15  122 lb (55.339 kg)  07/19/15 123 lb (55.792 kg)  04/11/15 124 lb (56.246 kg)    Physical Exam  Constitutional: She is oriented to person, place, and time. She appears well-developed and well-nourished. No distress.  HENT:  Head: Normocephalic and atraumatic.  Right Ear: Hearing normal.  Left Ear: Hearing normal.  Nose: Nose normal.  Eyes: Conjunctivae and lids are normal. Right eye exhibits no discharge. Left eye exhibits no discharge. No scleral icterus.  Cardiovascular: Normal rate, regular rhythm, normal heart sounds and intact distal pulses.  Exam reveals no gallop and no friction rub.   No murmur heard. Pulmonary/Chest: Effort normal and breath sounds normal. No respiratory distress. She has no wheezes. She has no rales. She exhibits no tenderness.  Musculoskeletal: Normal range of motion.  Neurological: She is alert and oriented to person, place, and time.  Skin: Skin is warm, dry and intact. No rash noted. No erythema. No pallor.  Psychiatric: She has a normal mood and affect. Her speech is normal and behavior is normal. Judgment and thought content normal. Cognition and memory are normal.  Nursing note and vitals reviewed.   Results for orders placed or performed in visit on 07/19/15  UA/M w/rflx Culture, Routine  Result Value Ref Range   Specific Gravity, UA 1.015 1.005 - 1.030   pH, UA 7.0 5.0 - 7.5   Color, UA Yellow Yellow   Appearance Ur Cloudy (A) Clear   Leukocytes, UA Negative Negative   Protein, UA Negative Negative/Trace  Glucose, UA Negative Negative   Ketones, UA Negative Negative   RBC, UA Negative Negative   Bilirubin, UA Negative Negative   Urobilinogen, Ur 1.0 0.2 - 1.0 mg/dL   Nitrite, UA Negative Negative      Assessment & Plan:   Problem List Items Addressed This Visit      Respiratory   Asthma    Doing better on breo. Rx given today. Continue to monitor.       Relevant Medications   fluticasone furoate-vilanterol (BREO ELLIPTA) 200-25  MCG/INH AEPB     Other   Anxiety disorder - Primary    Doing better on current regimen. Continue current regimen. FMLA paperwork filled out in case she has a panic attack. Follow up as scheduled.           Follow up plan: Return As scheduled.

## 2015-08-02 NOTE — Assessment & Plan Note (Signed)
Doing better on current regimen. Continue current regimen. FMLA paperwork filled out in case she has a panic attack. Follow up as scheduled.

## 2015-08-02 NOTE — Assessment & Plan Note (Signed)
Doing better on breo. Rx given today. Continue to monitor.

## 2015-08-05 ENCOUNTER — Encounter: Payer: Self-pay | Admitting: Family Medicine

## 2015-08-05 ENCOUNTER — Ambulatory Visit (INDEPENDENT_AMBULATORY_CARE_PROVIDER_SITE_OTHER): Payer: BLUE CROSS/BLUE SHIELD | Admitting: Family Medicine

## 2015-08-05 VITALS — BP 114/69 | HR 85 | Temp 98.9°F | Wt 121.7 lb

## 2015-08-05 DIAGNOSIS — D485 Neoplasm of uncertain behavior of skin: Secondary | ICD-10-CM

## 2015-08-05 NOTE — Progress Notes (Signed)
BP 114/69 mmHg  Pulse 85  Temp(Src) 98.9 F (37.2 C)  Wt 121 lb 11.2 oz (55.203 kg)  SpO2 97%  LMP 07/23/2015 (Exact Date)   Subjective:    Patient ID: Sheila Benton, female    DOB: May 13, 1980, 35 y.o.   MRN: [place]:[id number]  HPI: Sheila Benton is a 35 y.o. female  Chief Complaint  Patient presents with  . Nevus   SKIN LESION Duration: months Location: L scapula Painful: no Itching: no Onset: gradual Context: not changing Associated signs and symptoms: gets caught on her shirt History of skin cancer: no History of precancerous skin lesions: no Family history of skin cancer: no  Relevant past medical, surgical, family and social history reviewed and updated as indicated. Interim medical history since our last visit reviewed. Allergies and medications reviewed and updated.  Review of Systems  Constitutional: Negative.   Respiratory: Negative.   Skin: Negative.     Per HPI unless specifically indicated above     Objective:    BP 114/69 mmHg  Pulse 85  Temp(Src) 98.9 F (37.2 C)  Wt 121 lb 11.2 oz (55.203 kg)  SpO2 97%  LMP 07/23/2015 (Exact Date)  Wt Readings from Last 3 Encounters:  08/05/15 121 lb 11.2 oz (55.203 kg)  08/02/15 122 lb (55.339 kg)  07/19/15 123 lb (55.792 kg)    Physical Exam  Constitutional: She is oriented to person, place, and time. She appears well-developed and well-nourished. No distress.  HENT:  Head: Normocephalic and atraumatic.  Right Ear: Hearing normal.  Left Ear: Hearing normal.  Nose: Nose normal.  Eyes: Conjunctivae and lids are normal. Right eye exhibits no discharge. Left eye exhibits no discharge. No scleral icterus.  Pulmonary/Chest: Effort normal. No respiratory distress.  Musculoskeletal: Normal range of motion.  Neurological: She is alert and oriented to person, place, and time.  Skin: Skin is warm, dry and intact. No rash noted. No erythema. No pallor.  1/2 CM flat mole mid way down L scapula hyperpigmented with darker  area within, 5mm raised mole upper inner L scapula, hyperpigmented with area of darker skin within.   Psychiatric: She has a normal mood and affect. Her speech is normal and behavior is normal. Judgment and thought content normal. Cognition and memory are normal.  Nursing note and vitals reviewed.   Results for orders placed or performed in visit on 07/19/15  UA/M w/rflx Culture, Routine  Result Value Ref Range   Specific Gravity, UA 1.015 1.005 - 1.030   pH, UA 7.0 5.0 - 7.5   Color, UA Yellow Yellow   Appearance Ur Cloudy (A) Clear   Leukocytes, UA Negative Negative   Protein, UA Negative Negative/Trace   Glucose, UA Negative Negative   Ketones, UA Negative Negative   RBC, UA Negative Negative   Bilirubin, UA Negative Negative   Urobilinogen, Ur 1.0 0.2 - 1.0 mg/dL   Nitrite, UA Negative Negative      Assessment & Plan:   Problem List Items Addressed This Visit    None    Visit Diagnoses    Neoplasm of uncertain behavior of skin    -  Primary    Moles removed and sent for pathology. Await results.     Relevant Orders    Pathology Report    Pathology Report       Skin Procedure  Procedure: Informed consent given.  Sterile prep of the area.  Area infiltrated with lidocaine without epinephrine.  Using a surgical blade, part of the  upper dermis shaved off and sent  for pathology.  Area cauterized. Pt ed on scarring.     Diagnosis:   ICD-9-CM ICD-10-CM   1. Neoplasm of uncertain behavior of skin 238.2 D48.5 Pathology Report     Pathology Report   Moles removed and sent for pathology. Await results.     Lesion Location/Size: 39mm mole lower L scapula, 51mm mole upper L scapula Physician: MJ Consent:  Risks, benefits, and alternative treatments discussed and all questions were answered.  Patient elected to proceed and verbal consent obtained.  Description: Area prepped and draped using semi-sterile technique. Area locally anesthetized using 5 cc's of lidocaine 1% plain.  Shave biopsy of lesion performed using a #15 blade scalpel.  Adequate hemostastis achieved using Silver Nitrate. Wound dressed after application of bacitracin ointment. Lesions sent to pathology  Post Procedure Instructions:  Wound care instructions discussed and patient was instructed to keep area clean and dry.  Signs and symptoms of infection discussed, patient agrees to contact the office ASAP should they occur.  Dressing change recommended every other day.   Follow up plan: Return As scheduled.

## 2015-08-12 ENCOUNTER — Telehealth: Payer: Self-pay | Admitting: Family Medicine

## 2015-08-12 LAB — PATHOLOGY

## 2015-08-12 NOTE — Telephone Encounter (Signed)
Patient notified

## 2015-08-12 NOTE — Telephone Encounter (Signed)
Please let her know that her moles were just moles! Thanks!

## 2015-08-19 ENCOUNTER — Encounter: Payer: Self-pay | Admitting: Family Medicine

## 2015-08-19 ENCOUNTER — Ambulatory Visit (INDEPENDENT_AMBULATORY_CARE_PROVIDER_SITE_OTHER): Payer: BLUE CROSS/BLUE SHIELD | Admitting: Family Medicine

## 2015-08-19 VITALS — BP 111/73 | HR 85 | Temp 99.3°F | Ht 66.0 in | Wt 122.0 lb

## 2015-08-19 DIAGNOSIS — F411 Generalized anxiety disorder: Secondary | ICD-10-CM | POA: Diagnosis not present

## 2015-08-19 MED ORDER — CITALOPRAM HYDROBROMIDE 40 MG PO TABS: 40.0000 mg | ORAL_TABLET | Freq: Every day | ORAL | Status: DC

## 2015-08-19 MED ORDER — HYDROXYZINE HCL 25 MG PO TABS: 25.0000 mg | ORAL_TABLET | Freq: Three times a day (TID) | ORAL | Status: DC | PRN

## 2015-08-19 NOTE — Progress Notes (Signed)
BP 111/73 mmHg  Pulse 85  Temp(Src) 99.3 F (37.4 C)  Ht 5\' 6"  (1.676 m)  Wt 122 lb (55.339 kg)  BMI 19.70 kg/m2  SpO2 97%  LMP 07/23/2015 (Exact Date)   Subjective:    Patient ID: Sheila Benton, female    DOB: 07/24/1980, 35 y.o.   MRN: [place]:[id number]  HPI: Sheila Benton is a 35 y.o. female  Chief Complaint  Patient presents with  . Panic Attack   ANXIETY/STRESS Duration:better Anxious mood: yes  Excessive worrying: yes Irritability: no  Sweating: no Nausea: no Palpitations:no Hyperventilation: no Panic attacks: no Agoraphobia: no  Obscessions/compulsions: no Depressed mood: no GAD 7 : Generalized Anxiety Score 08/19/2015 07/19/2015 03/10/2015  Nervous, Anxious, on Edge 1 3 3   Control/stop worrying 1 3 3   Worry too much - different things 1 3 2   Trouble relaxing 2 3 2   Restless 0 3 1  Easily annoyed or irritable 2 3 3   Afraid - awful might happen 2 1 3   Total GAD 7 Score 9 19 17   Anxiety Difficulty Somewhat difficult Somewhat difficult Somewhat difficult   Anhedonia: no Weight changes: no Insomnia: no   Hypersomnia: no Fatigue/loss of energy: no Feelings of worthlessness: no Feelings of guilt: no Impaired concentration/indecisiveness: no Suicidal ideations: no  Crying spells: no Recent Stressors/Life Changes: no  Relevant past medical, surgical, family and social history reviewed and updated as indicated. Interim medical history since our last visit reviewed. Allergies and medications reviewed and updated.  Review of Systems  Constitutional: Negative.   Respiratory: Negative.   Cardiovascular: Negative.   Skin: Negative.   Psychiatric/Behavioral: Negative.     Per HPI unless specifically indicated above     Objective:    BP 111/73 mmHg  Pulse 85  Temp(Src) 99.3 F (37.4 C)  Ht 5\' 6"  (1.676 m)  Wt 122 lb (55.339 kg)  BMI 19.70 kg/m2  SpO2 97%  LMP 07/23/2015 (Exact Date)  Wt Readings from Last 3 Encounters:  08/19/15 122 lb (55.339 kg)   08/05/15 121 lb 11.2 oz (55.203 kg)  08/02/15 122 lb (55.339 kg)    Physical Exam  Constitutional: She is oriented to person, place, and time. She appears well-developed and well-nourished. No distress.  HENT:  Head: Normocephalic and atraumatic.  Right Ear: Hearing normal.  Left Ear: Hearing normal.  Nose: Nose normal.  Eyes: Conjunctivae and lids are normal. Right eye exhibits no discharge. Left eye exhibits no discharge. No scleral icterus.  Cardiovascular: Normal rate, regular rhythm, normal heart sounds and intact distal pulses.  Exam reveals no gallop and no friction rub.   No murmur heard. Pulmonary/Chest: Effort normal and breath sounds normal. No respiratory distress. She has no wheezes. She has no rales. She exhibits no tenderness.  Musculoskeletal: Normal range of motion.  Neurological: She is alert and oriented to person, place, and time.  Skin: Skin is warm, dry and intact. No rash noted. No erythema. No pallor.  Psychiatric: She has a normal mood and affect. Her speech is normal and behavior is normal. Judgment and thought content normal. Cognition and memory are normal.  Nursing note and vitals reviewed.   Results for orders placed or performed in visit on 08/05/15  Pathology Report  Result Value Ref Range   PATH REPORT.SITE OF ORIGIN SPEC Comment    . Comment    PATH REPORT.FINAL DX SPEC Comment    SIGNED OUT BY: Comment    GROSS DESCRIPTION: Comment    . Comment  PAYMENT PROCEDURE Comment       Assessment & Plan:   Problem List Items Addressed This Visit      Other   Anxiety disorder - Primary    Under good control. Continue current regimen. Continue to monitor. Recheck 6 months.           Follow up plan: Return in about 6 months (around 02/18/2016).

## 2015-08-19 NOTE — Assessment & Plan Note (Signed)
Under good control. Continue current regimen. Continue to monitor. Recheck 6 months.  

## 2015-09-07 ENCOUNTER — Encounter: Payer: Self-pay | Admitting: Family Medicine

## 2015-09-07 ENCOUNTER — Telehealth: Payer: Self-pay | Admitting: Family Medicine

## 2015-09-07 MED ORDER — CIPROFLOXACIN HCL 250 MG PO TABS: 250.0000 mg | ORAL_TABLET | Freq: Two times a day (BID) | ORAL | Status: DC

## 2015-09-07 NOTE — Telephone Encounter (Signed)
Pt would like rx sent to [facility] [phone]

## 2015-09-07 NOTE — Telephone Encounter (Signed)
Tried to call patient, no answer, LVM for patient to call back and let us know what pharmacy she would like her Rx for Cipro sent to

## 2015-09-07 NOTE — Telephone Encounter (Signed)
Please let her know that I'm able to get her a Rx for her, but we need to know where to send it. OK To call in the cipro above when we know where to send it.

## 2015-09-07 NOTE — Telephone Encounter (Signed)
Rx called in 

## 2015-09-26 ENCOUNTER — Other Ambulatory Visit: Payer: Self-pay | Admitting: Family Medicine

## 2015-10-14 ENCOUNTER — Encounter: Payer: Self-pay | Admitting: Family Medicine

## 2015-10-14 ENCOUNTER — Ambulatory Visit (INDEPENDENT_AMBULATORY_CARE_PROVIDER_SITE_OTHER): Payer: BLUE CROSS/BLUE SHIELD | Admitting: Family Medicine

## 2015-10-14 VITALS — HR 75 | Temp 98.9°F | Wt 123.0 lb

## 2015-10-14 DIAGNOSIS — J45909 Unspecified asthma, uncomplicated: Secondary | ICD-10-CM

## 2015-10-14 DIAGNOSIS — R5382 Chronic fatigue, unspecified: Secondary | ICD-10-CM

## 2015-10-14 DIAGNOSIS — D649 Anemia, unspecified: Secondary | ICD-10-CM | POA: Diagnosis not present

## 2015-10-14 LAB — CBC WITH DIFFERENTIAL/PLATELET
HEMATOCRIT: 33.1 % — AB (ref 34.0–46.6)
Hemoglobin: 11.5 g/dL (ref 11.1–15.9)
Lymphocytes Absolute: 2.2 10*3/uL (ref 0.7–3.1)
Lymphs: 31 %
MCH: 32 pg (ref 26.6–33.0)
MCHC: 34.7 g/dL (ref 31.5–35.7)
MCV: 92 fL (ref 79–97)
MID (ABSOLUTE): 0.7 10*3/uL (ref 0.1–1.6)
MID: 9 %
Neutrophils Absolute: 4.3 10*3/uL (ref 1.4–7.0)
Neutrophils: 60 %
PLATELETS: 294 10*3/uL (ref 150–379)
RBC: 3.59 x10E6/uL — AB (ref 3.77–5.28)
RDW: 12.8 % (ref 12.3–15.4)
WBC: 7.2 10*3/uL (ref 3.4–10.8)

## 2015-10-14 NOTE — Patient Instructions (Addendum)
Chronic Fatigue Syndrome Chronic fatigue syndrome (CFS) is a condition in which there is lasting, extreme tiredness (fatigue) that does not improve with rest. CFS affects women up to four times more often than men. If you have CFS, fatigue and other symptoms can make it hard for you to get through your day. There is no treatment or cure. You will need to work closely with your health care provider to come up with a treatment plan that works for you. CAUSES  No one knows what causes CFS. It may be triggered by a flu-like illness or by mono. Other triggers may include:  An abnormal immune system.  Low blood pressure.  Poor diet.  Physical or emotional stress. SIGNS AND SYMPTOMS The main symptom is fatigue that lasts all day, especially after physical or mental stress. Other common symptoms include:  An extreme loss of energy with no obvious cause.  Muscle or joint soreness.  Severe weakness.  Frequent headaches.  Fever.  Sore throat.  Swollen lymph glands.  Sleep is not refreshing.  Loss of concentration or memory. Less common symptoms may include:  Chills.  Night sweats.  Tingling or numbness.  Blurred vision.  Dizziness.  Sensitivity to noise or odors.  Mood swings.  Anxiety, panic attacks, and depression. Your symptoms may come and go, or you may have them all the time. DIAGNOSIS  There are no tests that can help health care providers diagnose CFS. It may take a long time for you to get a correct diagnosis. Your health care provider may need to do a number of tests to rule out other conditions that could be causing your symptoms. You may be diagnosed with CFS if:  You have fatigue that has lasted for at least six months.  Your fatigue is not relieved by rest.  Your fatigue is not caused by another condition.  Your fatigue is severe enough to interfere with work and daily activities.  You have at least four common symptoms of CFS. TREATMENT  There is no  cure for CFS at this time. The condition affects everyone differently. You will need to work with your health care provider to find the best treatment for your symptoms. Treatment may include:  Improving sleep with a regular bedtime routine.  Avoiding caffeine, alcohol, and tobacco.  Doing light exercise and stretching during the day.  Taking medicine to help you sleep.  Taking over-the-counter medicines to relieve joint or muscle pain.  Learning and practicing relaxation techniques.  Using memory aids or doing brain teasers to improve memory and concentration.  Seeing a mental health professional to evaluate and treat depression, if necessary.  Trying massage therapy, acupuncture, and movement exercises, like yoga or tai chi. HOME CARE INSTRUCTIONS Work closely with your health care provider to follow your treatment plan at home. You may need to make major lifestyle changes. If treatment does not seem to help, get a second opinion. You may get help from many health care providers, including doctors, mental health specialists, physical therapists, and rehabilitation therapists. Having the support of friends and loved ones is also important. SEEK MEDICAL CARE IF:  Your symptoms are not responding to treatment.  You are having strong feelings of anger, guilt, anxiety, or depression.   This information is not intended to replace advice given to you by your health care provider. Make sure you discuss any questions you have with your health care provider.   Document Released: 03/22/2004 Document Revised: 03/05/2014 Document Reviewed: 01/02/2013 Elsevier Interactive Patient   Education 2016 [employer].

## 2015-10-14 NOTE — Assessment & Plan Note (Signed)
Under good control. Continue current regimen. Continue to monitor.  

## 2015-10-14 NOTE — Progress Notes (Signed)
Pulse 75   Temp 98.9 F (37.2 C)   Wt 123 lb (55.8 kg)   LMP 10/12/2015 (Exact Date)   SpO2 95%   BMI 19.85 kg/m    Subjective:    Patient ID: Sheila Benton, female    DOB: Oct 20, 1980, 36 y.o.   MRN: [id number]  HPI: Sheila Benton is a 35 y.o. female  Chief Complaint  Patient presents with  . Fatigue    Patient has had anemia in the past, she is wanted it rechecked to make sure it is not still low  . Asthma   FATIGUE Duration:  chronic Severity: moderate  Onset: gradual Context when symptoms started:  unknown Symptoms improve with rest: no  Depressive symptoms: no Stress/anxiety: no Insomnia: no  Snoring: no Observed apnea by bed partner: no Daytime hypersomnolence:no Wakes feeling refreshed: no History of sleep study: no Dysnea on exertion:  no Orthopnea/PND: no Chest pain: no Chronic cough: no Lower extremity edema: no Arthralgias:no Myalgias: no Weakness: no Rash: no  ASTHMA- doing well. No concerns. Feels like breathing is great. Only using rescue occasionally.   Relevant past medical, surgical, family and social history reviewed and updated as indicated. Interim medical history since our last visit reviewed. Allergies and medications reviewed and updated.  Review of Systems  Constitutional: Positive for fatigue. Negative for appetite change, chills, diaphoresis, fever and unexpected weight change.  Respiratory: Negative.   Cardiovascular: Negative.   Musculoskeletal: Negative.   Psychiatric/Behavioral: Negative.     Per HPI unless specifically indicated above     Objective:    Pulse 75   Temp 98.9 F (37.2 C)   Wt 123 lb (55.8 kg)   LMP 10/12/2015 (Exact Date)   SpO2 95%   BMI 19.85 kg/m   Wt Readings from Last 3 Encounters:  10/14/15 123 lb (55.8 kg)  08/19/15 122 lb (55.3 kg)  08/05/15 121 lb 11.2 oz (55.2 kg)    Physical Exam  Constitutional: She is oriented to person, place, and time. She appears well-developed and  well-nourished. No distress.  HENT:  Head: Normocephalic and atraumatic.  Right Ear: Hearing normal.  Left Ear: Hearing normal.  Nose: Nose normal.  Eyes: Conjunctivae and lids are normal. Right eye exhibits no discharge. Left eye exhibits no discharge. No scleral icterus.  Cardiovascular: Normal rate, regular rhythm, normal heart sounds and intact distal pulses.  Exam reveals no gallop and no friction rub.   No murmur heard. Pulmonary/Chest: Effort normal and breath sounds normal. No respiratory distress. She has no wheezes. She has no rales. She exhibits no tenderness.  Musculoskeletal: Normal range of motion.  Neurological: She is alert and oriented to person, place, and time.  Skin: Skin is warm, dry and intact. No rash noted. No erythema. No pallor.  Psychiatric: She has a normal mood and affect. Her speech is normal and behavior is normal. Judgment and thought content normal. Cognition and memory are normal.  Nursing note and vitals reviewed.   Results for orders placed or performed in visit on 08/05/15  Pathology Report  Result Value Ref Range   PATH REPORT.SITE OF ORIGIN SPEC Comment    . Comment    PATH REPORT.FINAL DX SPEC Comment    SIGNED OUT BY: Comment    GROSS DESCRIPTION: Comment    . Comment    PAYMENT PROCEDURE Comment       Assessment & Plan:   Problem List Items Addressed This Visit      Respiratory   Asthma  Under good control. Continue current regimen. Continue to monitor.        Other Visit Diagnoses    Chronic fatigue    -  Primary   Will check labs. Await results. Treat as needed. Follow up pending results.    Relevant Orders   Thyroid Panel With TSH   Lyme Ab/Western Blot Reflex   Babesia microti Antibody Panel   Rocky mtn spotted fvr abs pnl(IgG+IgM)   Ehrlichia Antibody Panel   Anemia, unspecified anemia type       Borderline. Will check ferritin and iron studies. Start mvi with iron. Call with concerns.    Relevant Orders   CBC With  Differential/Platelet   Iron and TIBC   Ferritin       Follow up plan: Return Pending results.

## 2015-10-15 LAB — IRON AND TIBC
Iron Saturation: 29 % (ref 15–55)
Iron: 89 ug/dL (ref 27–159)
TIBC: 309 ug/dL (ref 250–450)
UIBC: 220 ug/dL (ref 131–425)

## 2015-10-15 LAB — FERRITIN: Ferritin: 18 ng/mL (ref 15–150)

## 2015-10-15 LAB — THYROID PANEL WITH TSH
FREE THYROXINE INDEX: 1.7 (ref 1.2–4.9)
T3 Uptake Ratio: 25 % (ref 24–39)
T4 TOTAL: 6.7 ug/dL (ref 4.5–12.0)
TSH: 1.21 u[IU]/mL (ref 0.450–4.500)

## 2015-10-19 LAB — EHRLICHIA ANTIBODY PANEL
E. CHAFFEENSIS (HME) IGM TITER: NEGATIVE
E. CHAFFEENSIS IGG AB: NEGATIVE
HGE IgG Titer: NEGATIVE
HGE IgM Titer: NEGATIVE

## 2015-10-19 LAB — LYME AB/WESTERN BLOT REFLEX
LYME DISEASE AB, QUANT, IGM: <0.8 index (ref 0.00–0.79)
Lyme IgG/IgM Ab: <0.91 {ISR} (ref 0.00–0.90)

## 2015-10-19 LAB — BABESIA MICROTI ANTIBODY PANEL

## 2015-10-19 LAB — ROCKY MTN SPOTTED FVR ABS PNL(IGG+IGM)
RMSF IGG: NEGATIVE
RMSF IGM: 0.56 {index} (ref 0.00–0.89)

## 2015-10-27 ENCOUNTER — Other Ambulatory Visit: Payer: Self-pay | Admitting: Family Medicine

## 2015-10-27 ENCOUNTER — Encounter: Payer: BLUE CROSS/BLUE SHIELD | Admitting: Family Medicine

## 2015-11-14 ENCOUNTER — Other Ambulatory Visit: Payer: Self-pay | Admitting: Family Medicine

## 2015-11-14 NOTE — Telephone Encounter (Signed)
Routing to correct provider

## 2015-12-10 ENCOUNTER — Other Ambulatory Visit: Payer: Self-pay | Admitting: Family Medicine

## 2015-12-15 ENCOUNTER — Ambulatory Visit
Admission: RE | Admit: 2015-12-15 | Discharge: 2015-12-15 | Disposition: A | Discharge status: Home / Self Care | Payer: BLUE CROSS/BLUE SHIELD | Source: Ambulatory Visit | Attending: Family Medicine | Admitting: Family Medicine

## 2015-12-15 ENCOUNTER — Ambulatory Visit (INDEPENDENT_AMBULATORY_CARE_PROVIDER_SITE_OTHER): Payer: BLUE CROSS/BLUE SHIELD | Admitting: Family Medicine

## 2015-12-15 ENCOUNTER — Encounter: Payer: Self-pay | Admitting: Family Medicine

## 2015-12-15 VITALS — BP 117/72 | HR 88 | Temp 98.5°F | Wt 130.0 lb

## 2015-12-15 DIAGNOSIS — R0789 Other chest pain: Secondary | ICD-10-CM | POA: Diagnosis not present

## 2015-12-15 DIAGNOSIS — R5382 Chronic fatigue, unspecified: Secondary | ICD-10-CM | POA: Insufficient documentation

## 2015-12-15 DIAGNOSIS — Z124 Encounter for screening for malignant neoplasm of cervix: Secondary | ICD-10-CM | POA: Diagnosis not present

## 2015-12-15 DIAGNOSIS — Z0000 Encounter for general adult medical examination without abnormal findings: Secondary | ICD-10-CM | POA: Diagnosis not present

## 2015-12-15 LAB — UA/M W/RFLX CULTURE, ROUTINE
Bilirubin, UA: NEGATIVE
GLUCOSE, UA: NEGATIVE
Ketones, UA: NEGATIVE
LEUKOCYTES UA: NEGATIVE
Nitrite, UA: NEGATIVE
PROTEIN UA: NEGATIVE
RBC, UA: NEGATIVE
Specific Gravity, UA: 1.015 (ref 1.005–1.030)
UUROB: 1 mg/dL (ref 0.2–1.0)
pH, UA: 7.5 (ref 5.0–7.5)

## 2015-12-15 NOTE — Patient Instructions (Addendum)
Health Maintenance, Female Adopting a healthy lifestyle and getting preventive care can go a long way to promote health and wellness. Talk with your health care provider about what schedule of regular examinations is right for you. This is a good chance for you to check in with your provider about disease prevention and staying healthy. In between checkups, there are plenty of things you can do on your own. Experts have done a lot of research about which lifestyle changes and preventive measures are most likely to keep you healthy. Ask your health care provider for more information. WEIGHT AND DIET  Eat a healthy diet  Be sure to include plenty of vegetables, fruits, low-fat dairy products, and lean protein.  Do not eat a lot of foods high in solid fats, added sugars, or salt.  Get regular exercise. This is one of the most important things you can do for your health.  Most adults should exercise for at least 150 minutes each week. The exercise should increase your heart rate and make you sweat (moderate-intensity exercise).  Most adults should also do strengthening exercises at least twice a week. This is in addition to the moderate-intensity exercise.  Maintain a healthy weight  Body mass index (BMI) is a measurement that can be used to identify possible weight problems. It estimates body fat based on height and weight. Your health care provider can help determine your BMI and help you achieve or maintain a healthy weight.  For females 36 years of age and older:   A BMI below 18.5 is considered underweight.  A BMI of 18.5 to 24.9 is normal.  A BMI of 25 to 29.9 is considered overweight.  A BMI of 30 and above is considered obese.  Watch levels of cholesterol and blood lipids  You should start having your blood tested for lipids and cholesterol at 36 years of age, then have this test every 5 years.  You may need to have your cholesterol levels checked more often if:  Your lipid  or cholesterol levels are high.  You are older than 36 years of age.  You are at high risk for heart disease.  CANCER SCREENING   Lung Cancer  Lung cancer screening is recommended for adults 66-88 years old who are at high risk for lung cancer because of a history of smoking.  A yearly low-dose CT scan of the lungs is recommended for people who:  Currently smoke.  Have quit within the past 15 years.  Have at least a 30-pack-year history of smoking. A pack year is smoking an average of one pack of cigarettes a day for 1 year.  Yearly screening should continue until it has been 15 years since you quit.  Yearly screening should stop if you develop a health problem that would prevent you from having lung cancer treatment.  Breast Cancer  Practice breast self-awareness. This means understanding how your breasts normally appear and feel.  It also means doing regular breast self-exams. Let your health care provider know about any changes, no matter how small.  If you are in your 20s or 30s, you should have a clinical breast exam (CBE) by a health care provider every 1-3 years as part of a regular health exam.  If you are 36 or older, have a CBE every year. Also consider having a breast X-ray (mammogram) every year.  If you have a family history of breast cancer, talk to your health care provider about genetic screening.  If you  are at high risk for breast cancer, talk to your health care provider about having an MRI and a mammogram every year.  Breast cancer gene (BRCA) assessment is recommended for women who have family members with BRCA-related cancers. BRCA-related cancers include:  Breast.  Ovarian.  Tubal.  Peritoneal cancers.  Results of the assessment will determine the need for genetic counseling and BRCA1 and BRCA2 testing. Cervical Cancer Your health care provider may recommend that you be screened regularly for cancer of the pelvic organs (ovaries, uterus, and  vagina). This screening involves a pelvic examination, including checking for microscopic changes to the surface of your cervix (Pap test). You may be encouraged to have this screening done every 3 years, beginning at age 21.  For women ages 30-65, health care providers may recommend pelvic exams and Pap testing every 3 years, or they may recommend the Pap and pelvic exam, combined with testing for human papilloma virus (HPV), every 5 years. Some types of HPV increase your risk of cervical cancer. Testing for HPV may also be done on women of any age with unclear Pap test results.  Other health care providers may not recommend any screening for nonpregnant women who are considered low risk for pelvic cancer and who do not have symptoms. Ask your health care provider if a screening pelvic exam is right for you.  If you have had past treatment for cervical cancer or a condition that could lead to cancer, you need Pap tests and screening for cancer for at least 20 years after your treatment. If Pap tests have been discontinued, your risk factors (such as having a new sexual partner) need to be reassessed to determine if screening should resume. Some women have medical problems that increase the chance of getting cervical cancer. In these cases, your health care provider may recommend more frequent screening and Pap tests. Colorectal Cancer  This type of cancer can be detected and often prevented.  Routine colorectal cancer screening usually begins at 36 years of age and continues through 36 years of age.  Your health care provider may recommend screening at an earlier age if you have risk factors for colon cancer.  Your health care provider may also recommend using home test kits to check for hidden blood in the stool.  A small camera at the end of a tube can be used to examine your colon directly (sigmoidoscopy or colonoscopy). This is done to check for the earliest forms of colorectal  cancer.  Routine screening usually begins at age 50.  Direct examination of the colon should be repeated every 5-10 years through 36 years of age. However, you may need to be screened more often if early forms of precancerous polyps or small growths are found. Skin Cancer  Check your skin from head to toe regularly.  Tell your health care provider about any new moles or changes in moles, especially if there is a change in a mole's shape or color.  Also tell your health care provider if you have a mole that is larger than the size of a pencil eraser.  Always use sunscreen. Apply sunscreen liberally and repeatedly throughout the day.  Protect yourself by wearing long sleeves, pants, a wide-brimmed hat, and sunglasses whenever you are outside. HEART DISEASE, DIABETES, AND HIGH BLOOD PRESSURE   High blood pressure causes heart disease and increases the risk of stroke. High blood pressure is more likely to develop in:  People who have blood pressure in the high end   of the normal range (130-139/85-89 mm Hg).  People who are overweight or obese.  People who are African American.  If you are 39-99 years of age, have your blood pressure checked every 3-5 years. If you are 66 years of age or older, have your blood pressure checked every year. You should have your blood pressure measured twice--once when you are at a hospital or clinic, and once when you are not at a hospital or clinic. Record the average of the two measurements. To check your blood pressure when you are not at a hospital or clinic, you can use:  An automated blood pressure machine at a pharmacy.  A home blood pressure monitor.  If you are between 67 years and 27 years old, ask your health care provider if you should take aspirin to prevent strokes.  Have regular diabetes screenings. This involves taking a blood sample to check your fasting blood sugar level.  If you are at a normal weight and have a low risk for diabetes,  have this test once every three years after 36 years of age.  If you are overweight and have a high risk for diabetes, consider being tested at a younger age or more often. PREVENTING INFECTION  Hepatitis B  If you have a higher risk for hepatitis B, you should be screened for this virus. You are considered at high risk for hepatitis B if:  You were born in a country where hepatitis B is common. Ask your health care provider which countries are considered high risk.  Your parents were born in a high-risk country, and you have not been immunized against hepatitis B (hepatitis B vaccine).  You have HIV or AIDS.  You use needles to inject street drugs.  You live with someone who has hepatitis B.  You have had sex with someone who has hepatitis B.  You get hemodialysis treatment.  You take certain medicines for conditions, including cancer, organ transplantation, and autoimmune conditions. Hepatitis C  Blood testing is recommended for:  Everyone born from [birth year] through 1966.  Anyone with known risk factors for hepatitis C. Sexually transmitted infections (STIs)  You should be screened for sexually transmitted infections (STIs) including gonorrhea and chlamydia if:  You are sexually active and are younger than 36 years of age.  You are older than 36 years of age and your health care provider tells you that you are at risk for this type of infection.  Your sexual activity has changed since you were last screened and you are at an increased risk for chlamydia or gonorrhea. Ask your health care provider if you are at risk.  If you do not have HIV, but are at risk, it may be recommended that you take a prescription medicine daily to prevent HIV infection. This is called pre-exposure prophylaxis (PrEP). You are considered at risk if:  You are sexually active and do not regularly use condoms or know the HIV status of your partner(s).  You take drugs by injection.  You are sexually  active with a partner who has HIV. Talk with your health care provider about whether you are at high risk of being infected with HIV. If you choose to begin PrEP, you should first be tested for HIV. You should then be tested every 3 months for as long as you are taking PrEP.  PREGNANCY   If you are premenopausal and you may become pregnant, ask your health care provider about preconception counseling.  If you may  become pregnant, take 400 to 800 micrograms (mcg) of folic acid every day.  If you want to prevent pregnancy, talk to your health care provider about birth control (contraception). OSTEOPOROSIS AND MENOPAUSE   Osteoporosis is a disease in which the bones lose minerals and strength with aging. This can result in serious bone fractures. Your risk for osteoporosis can be identified using a bone density scan.  If you are 61 years of age or older, or if you are at risk for osteoporosis and fractures, ask your health care provider if you should be screened.  Ask your health care provider whether you should take a calcium or vitamin D supplement to lower your risk for osteoporosis.  Menopause may have certain physical symptoms and risks.  Hormone replacement therapy may reduce some of these symptoms and risks. Talk to your health care provider about whether hormone replacement therapy is right for you.  HOME CARE INSTRUCTIONS   Schedule regular health, dental, and eye exams.  Stay current with your immunizations.   Do not use any tobacco products including cigarettes, chewing tobacco, or electronic cigarettes.  If you are pregnant, do not drink alcohol.  If you are breastfeeding, limit how much and how often you drink alcohol.  Limit alcohol intake to no more than 1 drink per day for nonpregnant women. One drink equals 12 ounces of beer, 5 ounces of wine, or 1 ounces of hard liquor.  Do not use street drugs.  Do not share needles.  Ask your health care provider for help if  you need support or information about quitting drugs.  Tell your health care provider if you often feel depressed.  Tell your health care provider if you have ever been abused or do not feel safe at home.   This information is not intended to replace advice given to you by your health care provider. Make sure you discuss any questions you have with your health care provider.   Document Released: 08/28/2010 Document Revised: 03/05/2014 Document Reviewed: 01/14/2013 Elsevier Interactive Patient Education [employer].

## 2015-12-15 NOTE — Progress Notes (Signed)
BP 117/72 (BP Location: Left Arm, Patient Position: Sitting, Cuff Size: Normal)   Pulse 88   Temp 98.5 F (36.9 C)   Wt 130 lb (59 kg)   SpO2 96%   BMI 20.98 kg/m    Subjective:    Patient ID: Sheila Benton, female    DOB: 10/17/1980, 35 y.o.   MRN: [id number]  HPI: Sheila Benton is a 35 y.o. female presenting on 12/15/2015 for comprehensive medical examination. Current medical complaints include:none  She currently lives with: husband and kids Menopausal Symptoms: no  Depression Screen done today and results listed below:  Depression screen [facility] 2/9 12/15/2015 07/19/2015  Decreased Interest 0 2  Down, Depressed, Hopeless 0 0  PHQ - 2 Score 0 2  Altered sleeping - 3  Tired, decreased energy - 3  Change in appetite - 3  Feeling bad or failure about yourself  - 0  Trouble concentrating - 3  Moving slowly or fidgety/restless - 0  Suicidal thoughts - 0  PHQ-9 Score - 14    Past Medical History:  Past Medical History:  Diagnosis Date  . Asthma     Surgical History:  History reviewed. No pertinent surgical history.  Medications:  Current Outpatient Prescriptions on File Prior to Visit  Medication Sig  . citalopram (CELEXA) 40 MG tablet Take 1 tablet (40 mg total) by mouth daily.  . fluticasone furoate-vilanterol (BREO ELLIPTA) 200-25 MCG/INH AEPB Inhale 1 puff into the lungs daily.  . hydrOXYzine (ATARAX/VISTARIL) 25 MG tablet Take 1 tablet (25 mg total) by mouth 3 (three) times daily as needed.  Marland Kitchen PREVIDENT 5000 BOOSTER PLUS 1.1 % PSTE   . PROAIR HFA 108 (90 Base) MCG/ACT inhaler INHALE 1 TO 2 PUFFS INTO THE LUNGS EVERY 4 HOURS AS NEEDED FOR WHEEZING OR SHORTNESS OF BREATH   No current facility-administered medications on file prior to visit.     Allergies:  Allergies  Allergen Reactions  . Omnipaque [Iohexol] Itching    Patient approximately 5 minutes after injection sneezed once and then started itching uncontrollably all over. SPM    Social History:    Social History   Social History  . Marital status: Married    Spouse name: N/A  . Number of children: N/A  . Years of education: N/A   Occupational History  . Not on file.   Social History Main Topics  . Smoking status: Current Every Day Smoker    Types: E-cigarettes  . Smokeless tobacco: Never Used  . Alcohol use No  . Drug use: No  . Sexual activity: Yes     Comment: Tubes Tied   Other Topics Concern  . Not on file   Social History Narrative  . No narrative on file   History  Smoking Status  . Current Every Day Smoker  . Types: E-cigarettes  Smokeless Tobacco  . Never Used   History  Alcohol Use No    Family History:  History reviewed. No pertinent family history.  Past medical history, surgical history, medications, allergies, family history and social history reviewed with patient today and changes made to appropriate areas of the chart.   Review of Systems  Constitutional: Positive for chills and malaise/fatigue. Negative for diaphoresis, fever and weight loss.  HENT: Positive for congestion. Negative for ear discharge, ear pain, hearing loss, nosebleeds, sore throat and tinnitus.   Eyes: Negative.   Respiratory: Positive for shortness of breath (with anxiety). Negative for cough, hemoptysis, sputum production, wheezing and stridor.  Cardiovascular: Positive for chest pain (occasionally at random times ) and palpitations. Negative for orthopnea, claudication, leg swelling and PND.  Gastrointestinal: Positive for abdominal pain and heartburn. Negative for blood in stool, constipation, diarrhea, melena, nausea and vomiting.  Genitourinary: Negative.        Urine smells like green beans   Musculoskeletal: Negative.   Skin: Negative.   Neurological: Positive for tingling (fingers and toes occasionally). Negative for dizziness, tremors, sensory change, speech change, focal weakness, seizures, loss of consciousness, weakness and headaches.   Endo/Heme/Allergies: Positive for environmental allergies. Negative for polydipsia. Does not bruise/bleed easily.  Psychiatric/Behavioral: Negative.     All other ROS negative except what is listed above and in the HPI.      Objective:    BP 117/72 (BP Location: Left Arm, Patient Position: Sitting, Cuff Size: Normal)   Pulse 88   Temp 98.5 F (36.9 C)   Wt 130 lb (59 kg)   SpO2 96%   BMI 20.98 kg/m   Wt Readings from Last 3 Encounters:  12/15/15 130 lb (59 kg)  10/14/15 123 lb (55.8 kg)  08/19/15 122 lb (55.3 kg)    Physical Exam  Constitutional: She is oriented to person, place, and time. She appears well-developed and well-nourished. No distress.  HENT:  Head: Normocephalic and atraumatic.  Right Ear: Hearing, tympanic membrane, external ear and ear canal normal.  Left Ear: Hearing, tympanic membrane, external ear and ear canal normal.  Nose: Nose normal.  Mouth/Throat: Uvula is midline, oropharynx is clear and moist and mucous membranes are normal. No oropharyngeal exudate.  Eyes: Conjunctivae, EOM and lids are normal. Pupils are equal, round, and reactive to light. Right eye exhibits no discharge. Left eye exhibits no discharge. No scleral icterus.  Neck: Normal range of motion. Neck supple. No JVD present. No tracheal deviation present. No thyromegaly present.  Cardiovascular: Normal rate, regular rhythm, normal heart sounds and intact distal pulses.  Exam reveals no gallop and no friction rub.   No murmur heard. Pulmonary/Chest: Effort normal. No stridor. No respiratory distress. She has decreased breath sounds in the right upper field, the right middle field, the right lower field, the left upper field, the left middle field and the left lower field. She has no wheezes. She has no rales. She exhibits no tenderness. Right breast exhibits no inverted nipple, no mass, no nipple discharge, no skin change and no tenderness. Left breast exhibits no inverted nipple, no mass, no  nipple discharge, no skin change and no tenderness. Breasts are symmetrical.  Abdominal: Soft. Bowel sounds are normal. She exhibits no distension and no mass. There is no tenderness. There is no rebound and no guarding. Hernia confirmed negative in the right inguinal area and confirmed negative in the left inguinal area.  Genitourinary: Vagina normal and uterus normal. No labial fusion. There is no rash, tenderness, lesion or injury on the right labia. There is no rash, tenderness, lesion or injury on the left labia. Uterus is not deviated, not enlarged, not fixed and not tender. Cervix exhibits no motion tenderness, no discharge and no friability. Right adnexum displays no mass, no tenderness and no fullness. Left adnexum displays no mass, no tenderness and no fullness. No erythema, tenderness or bleeding in the vagina. No foreign body in the vagina. No signs of injury around the vagina. No vaginal discharge found.  Musculoskeletal: Normal range of motion. She exhibits no edema, tenderness or deformity.  Lymphadenopathy:    She has no cervical adenopathy.  Neurological: She is alert and oriented to person, place, and time. She has normal reflexes. She displays normal reflexes. No cranial nerve deficit. She exhibits normal muscle tone. Coordination normal.  Skin: Skin is warm, dry and intact. No rash noted. She is not diaphoretic. No erythema. No pallor.  Psychiatric: She has a normal mood and affect. Her speech is normal and behavior is normal. Judgment and thought content normal. Cognition and memory are normal.  Nursing note and vitals reviewed.   Results for orders placed or performed in visit on 10/14/15  CBC With Differential/Platelet  Result Value Ref Range   WBC 7.2 3.4 - 10.8 x10E3/uL   RBC 3.59 (L) 3.77 - 5.28 x10E6/uL   Hemoglobin 11.5 11.1 - 15.9 g/dL   Hematocrit 33.1 (L) 34.0 - 46.6 %   MCV 92 79 - 97 fL   MCH 32.0 26.6 - 33.0 pg   MCHC 34.7 31.5 - 35.7 g/dL   RDW 12.8 12.3 -  15.4 %   Platelets 294 150 - 379 x10E3/uL   Neutrophils 60 %   Lymphs 31 %   MID 9 %   Neutrophils Absolute 4.3 1.4 - 7.0 x10E3/uL   Lymphocytes Absolute 2.2 0.7 - 3.1 x10E3/uL   MID (Absolute) 0.7 0.1 - 1.6 X10E3/uL  Thyroid Panel With TSH  Result Value Ref Range   TSH 1.210 0.450 - 4.500 uIU/mL   T4, Total 6.7 4.5 - 12.0 ug/dL   T3 Uptake Ratio 25 24 - 39 %   Free Thyroxine Index 1.7 1.2 - 4.9  Iron and TIBC  Result Value Ref Range   Total Iron Binding Capacity 309 250 - 450 ug/dL   UIBC 220 131 - 425 ug/dL   Iron 89 27 - 159 ug/dL   Iron Saturation 29 15 - 55 %  Ferritin  Result Value Ref Range   Ferritin 18 15 - 150 ng/mL  Lyme Ab/Western Blot Reflex  Result Value Ref Range   Lyme IgG/IgM Ab <0.91 0.00 - 0.90 ISR   LYME DISEASE AB, QUANT, IGM <0.80 0.00 - 0.79 index  Babesia microti Antibody Panel  Result Value Ref Range   Babesia microti IgM <1:10 Neg:<1:10   Babesia microti IgG <1:10 Neg:<1:10  Rocky mtn spotted fvr abs pnl(IgG+IgM)  Result Value Ref Range   RMSF IgG Negative Negative   RMSF IgM 0.56 0.00 - 123456 index  Ehrlichia Antibody Panel  Result Value Ref Range   E.Chaffeensis (HME) IgG Negative Neg:<1:64   E. Chaffeensis (HME) IgM Titer Negative Neg:<1:20   HGE IgG Titer Negative Neg:<1:64   HGE IgM Titer Negative Neg:<1:20      Assessment & Plan:   Problem List Items Addressed This Visit    None    Visit Diagnoses    Routine general medical examination at a health care facility    -  Primary   Declines vaccines, screening labs checked today. Pap done today. Continue diet and exercise. Call with any concerns.    Relevant Orders   CBC with Differential/Platelet   Comprehensive metabolic panel   Lipid Panel w/o Chol/HDL Ratio   TSH   UA/M w/rflx Culture, Routine   Chronic fatigue       Still no better. Will check CXR today. Continue to monitor.    Relevant Orders   DG Chest 2 View   Other chest pain       CXR ordered today. Await results.  Continue inhalers.    Relevant Orders   DG Chest  2 View   Screening for cervical cancer       Pap done today.   Relevant Orders   IGP, Aptima HPV, rfx 16/18,45       Follow up plan: Return in about 6 months (around 06/14/2016).   LABORATORY TESTING:  - Pap smear: pap done  IMMUNIZATIONS:   - Tdap: Tetanus vaccination status reviewed: Td vaccination indicated and refused today. - Influenza: Refused - Pneumovax: Refused  PATIENT COUNSELING:   Advised to take 1 mg of folate supplement per day if capable of pregnancy.   Sexuality: Discussed sexually transmitted diseases, partner selection, use of condoms, avoidance of unintended pregnancy  and contraceptive alternatives.   Advised to avoid cigarette smoking.  I discussed with the patient that most people either abstain from alcohol or drink within safe limits (<=14/week and <=4 drinks/occasion for males, <=7/weeks and <= 3 drinks/occasion for females) and that the risk for alcohol disorders and other health effects rises proportionally with the number of drinks per week and how often a drinker exceeds daily limits.  Discussed cessation/primary prevention of drug use and availability of treatment for abuse.   Diet: Encouraged to adjust caloric intake to maintain  or achieve ideal body weight, to reduce intake of dietary saturated fat and total fat, to limit sodium intake by avoiding high sodium foods and not adding table salt, and to maintain adequate dietary potassium and calcium preferably from fresh fruits, vegetables, and low-fat dairy products.    stressed the importance of regular exercise  Injury prevention: Discussed safety belts, safety helmets, smoke detector, smoking near bedding or upholstery.   Dental health: Discussed importance of regular tooth brushing, flossing, and dental visits.    NEXT PREVENTATIVE PHYSICAL DUE IN 1 YEAR. Return in about 6 months (around 06/14/2016).

## 2015-12-16 LAB — COMPREHENSIVE METABOLIC PANEL
A/G RATIO: 1.9 (ref 1.2–2.2)
ALBUMIN: 4.2 g/dL (ref 3.5–5.5)
ALT: 10 IU/L (ref 0–32)
AST: 11 IU/L (ref 0–40)
Alkaline Phosphatase: 60 IU/L (ref 39–117)
BILIRUBIN TOTAL: 0.2 mg/dL (ref 0.0–1.2)
BUN/Creatinine Ratio: 8 — ABNORMAL LOW (ref 9–23)
BUN: 7 mg/dL (ref 6–20)
CHLORIDE: 103 mmol/L (ref 96–106)
CO2: 24 mmol/L (ref 18–29)
Calcium: 9.5 mg/dL (ref 8.7–10.2)
Creatinine, Ser: 0.84 mg/dL (ref 0.57–1.00)
GFR calc non Af Amer: 90 mL/min/{1.73_m2} (ref >59)
GFR, EST AFRICAN AMERICAN: 103 mL/min/{1.73_m2} (ref >59)
Globulin, Total: 2.2 g/dL (ref 1.5–4.5)
Glucose: 83 mg/dL (ref 65–99)
POTASSIUM: 4.1 mmol/L (ref 3.5–5.2)
SODIUM: 141 mmol/L (ref 134–144)
TOTAL PROTEIN: 6.4 g/dL (ref 6.0–8.5)

## 2015-12-16 LAB — LIPID PANEL W/O CHOL/HDL RATIO
Cholesterol, Total: 137 mg/dL (ref 100–199)
HDL: 57 mg/dL (ref >39)
LDL Calculated: 62 mg/dL (ref 0–99)
Triglycerides: 90 mg/dL (ref 0–149)
VLDL CHOLESTEROL CAL: 18 mg/dL (ref 5–40)

## 2015-12-16 LAB — CBC WITH DIFFERENTIAL/PLATELET
BASOS: 1 %
Basophils Absolute: 0.1 10*3/uL (ref 0.0–0.2)
EOS (ABSOLUTE): 0.3 10*3/uL (ref 0.0–0.4)
Eos: 5 %
HEMOGLOBIN: 11.3 g/dL (ref 11.1–15.9)
Hematocrit: 34.9 % (ref 34.0–46.6)
IMMATURE GRANS (ABS): 0 10*3/uL (ref 0.0–0.1)
Immature Granulocytes: 0 %
LYMPHS ABS: 2.2 10*3/uL (ref 0.7–3.1)
LYMPHS: 31 %
MCH: 30.1 pg (ref 26.6–33.0)
MCHC: 32.4 g/dL (ref 31.5–35.7)
MCV: 93 fL (ref 79–97)
MONOCYTES: 8 %
Monocytes Absolute: 0.6 10*3/uL (ref 0.1–0.9)
NEUTROS ABS: 3.9 10*3/uL (ref 1.4–7.0)
Neutrophils: 55 %
Platelets: 290 10*3/uL (ref 150–379)
RBC: 3.76 x10E6/uL — ABNORMAL LOW (ref 3.77–5.28)
RDW: 12.9 % (ref 12.3–15.4)
WBC: 7 10*3/uL (ref 3.4–10.8)

## 2015-12-16 LAB — TSH: TSH: 0.777 u[IU]/mL (ref 0.450–4.500)

## 2015-12-21 LAB — IGP, APTIMA HPV, RFX 16/18,45
HPV APTIMA: NEGATIVE
PAP Smear Comment: 0

## 2015-12-30 ENCOUNTER — Other Ambulatory Visit: Payer: Self-pay | Admitting: Family Medicine

## 2016-01-08 ENCOUNTER — Ambulatory Visit
Admission: EM | Admit: 2016-01-08 | Discharge: 2016-01-08 | Disposition: A | Discharge status: Home / Self Care | Payer: BLUE CROSS/BLUE SHIELD | Attending: Family Medicine | Admitting: Family Medicine

## 2016-01-08 DIAGNOSIS — N3000 Acute cystitis without hematuria: Secondary | ICD-10-CM

## 2016-01-08 HISTORY — DX: Anxiety disorder, unspecified: F41.9

## 2016-01-08 LAB — URINALYSIS COMPLETE WITH MICROSCOPIC (ARMC ONLY)
BILIRUBIN URINE: NEGATIVE
GLUCOSE, UA: NEGATIVE mg/dL
Hgb urine dipstick: NEGATIVE
Leukocytes, UA: NEGATIVE
Nitrite: POSITIVE — AB
Protein, ur: 30 mg/dL — AB
Specific Gravity, Urine: >1.03 — ABNORMAL HIGH (ref 1.005–1.030)
pH: 5.5 (ref 5.0–8.0)

## 2016-01-08 MED ORDER — CIPROFLOXACIN HCL 500 MG PO TABS: 500.0000 mg | ORAL_TABLET | Freq: Two times a day (BID) | ORAL | 0 refills | Status: DC

## 2016-01-08 NOTE — ED Provider Notes (Signed)
[facility]    CSN: [id number] Arrival date & time: 01/08/16  1344  History   Chief Complaint Chief Complaint  Patient presents with  . Urinary Tract Infection   HPI  35 year old female presents with concerns for UTI.  Patient reports one-week history of lower back pain, lower abdominal pain, urinary frequency and urgency. Symptoms are severe per her report. She also reports associated cloudy urine. She endorses a history of UTIs. No known exacerbating or relieving factors. No fever or chills. No other associated symptoms. No other complaints at this time.  Past Medical History:  Diagnosis Date  . Anxiety   . Asthma    Patient Active Problem List   Diagnosis Date Noted  . 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  . Anxiety disorder 03/10/2015  . Asthma 02/10/2015   History reviewed. No pertinent surgical history.  OB History    No data available     Home Medications    Prior to Admission medications   Medication Sig Start Date End Date Taking? Authorizing Provider  citalopram (CELEXA) 40 MG tablet Take 1 tablet (40 mg total) by mouth daily. 08/19/15  Yes Megan P Johnson, DO  fluticasone furoate-vilanterol (BREO ELLIPTA) 200-25 MCG/INH AEPB Inhale 1 puff into the lungs daily. 08/02/15  Yes Megan P Johnson, DO  PROAIR HFA 108 (90 Base) MCG/ACT inhaler INHALE 1 TO 2 PUFFS INTO THE LUNGS EVERY 4 HOURS AS NEEDED FOR WHEEZING OR SHORTNESS OF BREATH 12/30/15  Yes Volney American, PA-C  ciprofloxacin (CIPRO) 500 MG tablet Take 1 tablet (500 mg total) by mouth 2 (two) times daily. 01/08/16   Coral Spikes, DO   Family History History reviewed. No pertinent family history.  Social History Social History  Substance Use Topics  . Smoking status: Current Every Day Smoker    Types: E-cigarettes  . Smokeless tobacco: Never Used  . Alcohol use No   Allergies   Omnipaque [iohexol]   Review of Systems Review of Systems  Gastrointestinal: Positive for  abdominal pain.  Genitourinary: Positive for frequency and urgency.  Musculoskeletal: Positive for back pain.  All other systems reviewed and are negative.  Physical Exam Triage Vital Signs ED Triage Vitals  Enc Vitals Group     BP 01/08/16 1537 97/70     Pulse Rate 01/08/16 1537 74     Resp 01/08/16 1537 16     Temp 01/08/16 1537 98.1 F (36.7 C)     Temp Source 01/08/16 1537 Oral     SpO2 01/08/16 1537 100 %     Weight 01/08/16 1540 130 lb (59 kg)     Height 01/08/16 1540 5\' 7"  (1.702 m)     Head Circumference --      Peak Flow --      Pain Score 01/08/16 1451 5     Pain Loc --      Pain Edu? --      Excl. in [place]? --    Updated Vital Signs BP 97/70 (BP Location: Left Arm)   Pulse 74   Temp 98.1 F (36.7 C) (Oral)   Resp 16   Ht 5\' 7"  (1.702 m)   Wt 130 lb (59 kg)   LMP 12/29/2015   SpO2 100%   BMI 20.36 kg/m   Physical Exam  Constitutional: She is oriented to person, place, and time. She appears well-developed. No distress.  HENT:  Head: Normocephalic and atraumatic.  Eyes: Conjunctivae are normal.  Neck: Normal range of  motion.  Cardiovascular: Normal rate and regular rhythm.   Pulmonary/Chest: Effort normal and breath sounds normal.  Abdominal: Soft.  Tender to palpation in lower abdomen, particularly the suprapubic region. CVA tenderness bilaterally.  Musculoskeletal: Normal range of motion.  Neurological: She is alert and oriented to person, place, and time.  Skin: No rash noted.  Psychiatric: She has a normal mood and affect.  Vitals reviewed.  UC Treatments / Results  Labs (all labs ordered are listed, but only abnormal results are displayed) Labs Reviewed  URINALYSIS COMPLETEWITH MICROSCOPIC ([facility] ONLY) - Abnormal; Notable for the following:       Result Value   APPearance CLOUDY (*)    Ketones, ur TRACE (*)    Specific Gravity, Urine >1.030 (*)    Protein, ur 30 (*)    Nitrite POSITIVE (*)    Bacteria, UA MANY (*)    Squamous Epithelial /  LPF 6-30 (*)    All other components within normal limits   EKG  EKG Interpretation None      Radiology No results found.  Procedures Procedures (including critical care time)  Medications Ordered in UC Medications - No data to display  Initial Impression / Assessment and Plan / UC Course  I have reviewed the triage vital signs and the nursing notes.  Pertinent labs & imaging results that were available during my care of the patient were reviewed by me and considered in my medical decision making (see chart for details).  Clinical Course   35 year old female presents with back pain, abdominal pain, and urinary complaints. Urinalysis abnormal. Treating empirically for UTI with Cipro while awaiting culture.  Final Clinical Impressions(s) / UC Diagnoses   Final diagnoses:  Acute cystitis without hematuria   New Prescriptions Discharge Medication List as of 01/08/2016  4:16 PM    START taking these medications   Details  ciprofloxacin (CIPRO) 500 MG tablet Take 1 tablet (500 mg total) by mouth 2 (two) times daily., Starting Sun 01/08/2016, [place], DO 01/08/16 1622

## 2016-01-08 NOTE — Discharge Instructions (Signed)
Take antibiotic as prescribed.  Follow-up with primary if you fail to improve or worsen.  Take care  Dr. Lacinda Axon

## 2016-01-08 NOTE — ED Triage Notes (Signed)
Pt. C/O urinary discomfort, abdominal pain, lower back pain.

## 2016-01-11 ENCOUNTER — Telehealth: Payer: Self-pay

## 2016-01-11 LAB — URINE CULTURE: Culture: >=100000 — AB

## 2016-01-11 MED ORDER — SULFAMETHOXAZOLE-TRIMETHOPRIM 800-160 MG PO TABS: 1.0000 | ORAL_TABLET | Freq: Two times a day (BID) | ORAL | 0 refills | Status: AC

## 2016-01-11 NOTE — Telephone Encounter (Signed)
-----   Message from Norval Gable, MD sent at 01/11/2016  4:13 PM EST ----- Notify patient of results and need to switch antibiotic; call in rx for Bactrim DS 1 tab po bid x 7 days, #14 tabs

## 2016-01-11 NOTE — Telephone Encounter (Signed)
Patient called in and was advised of all results. I have sent in Rx to pharmacy as requested.

## 2016-01-12 ENCOUNTER — Other Ambulatory Visit: Payer: Self-pay | Admitting: Family Medicine

## 2016-01-25 ENCOUNTER — Other Ambulatory Visit: Payer: Self-pay | Admitting: Family Medicine

## 2016-01-26 ENCOUNTER — Other Ambulatory Visit: Payer: Self-pay | Admitting: Family Medicine

## 2016-01-26 NOTE — Telephone Encounter (Signed)
Routing to provider. Patient was advised that since she had it refilled on 11/16, that she should not be out yet. Helene Kelp advised her that she'd need to be seen for a refill this soon and the patient hung up on her.

## 2016-02-22 ENCOUNTER — Other Ambulatory Visit: Payer: Self-pay | Admitting: Family Medicine

## 2016-03-03 ENCOUNTER — Other Ambulatory Visit: Payer: Self-pay | Admitting: Family Medicine

## 2016-03-19 ENCOUNTER — Other Ambulatory Visit: Payer: Self-pay

## 2016-03-19 ENCOUNTER — Other Ambulatory Visit: Payer: Self-pay | Admitting: Family Medicine

## 2016-03-19 NOTE — Telephone Encounter (Signed)
Routing to provider. Appt 06/14/16.

## 2016-04-05 ENCOUNTER — Other Ambulatory Visit: Payer: Self-pay | Admitting: Family Medicine

## 2016-04-05 NOTE — Telephone Encounter (Signed)
Routing to provider. Appt 06/14/16

## 2016-04-18 ENCOUNTER — Ambulatory Visit: Payer: BLUE CROSS/BLUE SHIELD | Admitting: Family Medicine

## 2016-04-20 ENCOUNTER — Other Ambulatory Visit: Payer: Self-pay | Admitting: Family Medicine

## 2016-04-27 ENCOUNTER — Ambulatory Visit (INDEPENDENT_AMBULATORY_CARE_PROVIDER_SITE_OTHER): Payer: BLUE CROSS/BLUE SHIELD | Admitting: Family Medicine

## 2016-04-27 ENCOUNTER — Encounter: Payer: Self-pay | Admitting: Family Medicine

## 2016-04-27 VITALS — BP 124/78 | HR 74 | Temp 97.9°F | Resp 17 | Ht 67.0 in | Wt 132.0 lb

## 2016-04-27 DIAGNOSIS — J4540 Moderate persistent asthma, uncomplicated: Secondary | ICD-10-CM | POA: Diagnosis not present

## 2016-04-27 DIAGNOSIS — R1084 Generalized abdominal pain: Secondary | ICD-10-CM

## 2016-04-27 LAB — UA/M W/RFLX CULTURE, ROUTINE
BILIRUBIN UA: NEGATIVE
GLUCOSE, UA: NEGATIVE
Ketones, UA: NEGATIVE
LEUKOCYTES UA: NEGATIVE
Nitrite, UA: NEGATIVE
PH UA: 8 — AB (ref 5.0–7.5)
PROTEIN UA: NEGATIVE
RBC, UA: NEGATIVE
Specific Gravity, UA: 1.015 (ref 1.005–1.030)
Urobilinogen, Ur: 0.2 mg/dL (ref 0.2–1.0)

## 2016-04-27 MED ORDER — FLUTICASONE FUROATE-VILANTEROL 200-25 MCG/INH IN AEPB: 1.0000 | INHALATION_SPRAY | Freq: Every day | RESPIRATORY_TRACT | 6 refills | Status: DC

## 2016-04-27 MED ORDER — ALBUTEROL SULFATE (2.5 MG/3ML) 0.083% IN NEBU: 2.5000 mg | INHALATION_SOLUTION | Freq: Four times a day (QID) | RESPIRATORY_TRACT | 3 refills | Status: DC | PRN

## 2016-04-27 NOTE — Progress Notes (Signed)
BP 124/78 (BP Location: Left Arm, Patient Position: Sitting, Cuff Size: Normal)   Pulse 74   Temp 97.9 F (36.6 C) (Oral)   Resp 17   Ht 5\' 7"  (1.702 m)   Wt 132 lb (59.9 kg)   LMP 04/17/2016   SpO2 98%   BMI 20.67 kg/m    Subjective:    Patient ID: Sheila Benton, female    DOB: October 13, 1980, 36 y.o.   MRN: [id number]  HPI: Sheila Benton is a 36 y.o. female  Chief Complaint  Patient presents with  . Abdominal Pain    After eating onset 2 weeks   ABDOMINAL ISSUES- any time she eats anything, she starts having diarrhea, just a little bit Duration: 2 weeks Nature: bloating and cramping Location: diffuse  Severity: 1/10  Radiation: no Episode duration: until she has a BM Frequency: intermittent Alleviating factors: Having a BM Aggravating factors: Nothing Treatments attempted: nauseen- helped a bit Constipation: no Diarrhea: yes Episodes of diarrhea/day: 3-4x a day Mucous in the stool: yes Heartburn: no Bloating:yes Flatulence: yes Nausea: no Vomiting: no Melena or hematochezia: no Rash: no Jaundice: no Fever: no Weight loss: no  ASTHMA- breo was too expensive, so never filled it.  Asthma status: uncontrolled Satisfied with current treatment?: no Albuterol/rescue inhaler frequency: daily Dyspnea frequency: daily Wheezing frequency: daily Cough frequency: daily Nocturnal symptom frequency: several times a week  Limitation of activity: yes Current upper respiratory symptoms: no   Relevant past medical, surgical, family and social history reviewed and updated as indicated. Interim medical history since our last visit reviewed. Allergies and medications reviewed and updated.  Review of Systems  Constitutional: Negative.   Respiratory: Negative.   Cardiovascular: Negative.   Gastrointestinal: Positive for abdominal distention and diarrhea. Negative for abdominal pain, anal bleeding, blood in stool, constipation, nausea, rectal pain and vomiting.    Psychiatric/Behavioral: Negative.     Per HPI unless specifically indicated above     Objective:    BP 124/78 (BP Location: Left Arm, Patient Position: Sitting, Cuff Size: Normal)   Pulse 74   Temp 97.9 F (36.6 C) (Oral)   Resp 17   Ht 5\' 7"  (1.702 m)   Wt 132 lb (59.9 kg)   LMP 04/17/2016   SpO2 98%   BMI 20.67 kg/m   Wt Readings from Last 3 Encounters:  04/27/16 132 lb (59.9 kg)  01/08/16 130 lb (59 kg)  12/15/15 130 lb (59 kg)    Physical Exam  Constitutional: She is oriented to person, place, and time. She appears well-developed and well-nourished. No distress.  HENT:  Head: Normocephalic and atraumatic.  Right Ear: Hearing normal.  Left Ear: Hearing normal.  Nose: Nose normal.  Eyes: Conjunctivae and lids are normal. Right eye exhibits no discharge. Left eye exhibits no discharge. No scleral icterus.  Cardiovascular: Normal rate, regular rhythm, normal heart sounds and intact distal pulses.  Exam reveals no gallop and no friction rub.   No murmur heard. Pulmonary/Chest: Effort normal and breath sounds normal. No respiratory distress. She has no wheezes. She has no rales. She exhibits no tenderness.  Abdominal: Soft. Bowel sounds are normal. She exhibits no distension and no mass. There is no tenderness. There is no rebound and no guarding.  Musculoskeletal: Normal range of motion.  Neurological: She is alert and oriented to person, place, and time.  Skin: Skin is warm, dry and intact. No rash noted. No erythema. No pallor.  Psychiatric: She has a normal mood and affect. Her  speech is normal and behavior is normal. Judgment and thought content normal. Cognition and memory are normal.  Nursing note and vitals reviewed.   Results for orders placed or performed in visit on 04/27/16  UA/M w/rflx Culture, Routine  Result Value Ref Range   Specific Gravity, UA 1.015 1.005 - 1.030   pH, UA 8.0 (H) 5.0 - 7.5   Color, UA Yellow Yellow   Appearance Ur Clear Clear    Leukocytes, UA Negative Negative   Protein, UA Negative Negative/Trace   Glucose, UA Negative Negative   Ketones, UA Negative Negative   RBC, UA Negative Negative   Bilirubin, UA Negative Negative   Urobilinogen, Ur 0.2 0.2 - 1.0 mg/dL   Nitrite, UA Negative Negative      Assessment & Plan:   Problem List Items Addressed This Visit      Respiratory   Asthma    Coupon card for breo given today. Call if still too expensive. Call with any concerns, recheck 1 month.       Relevant Medications   albuterol (PROVENTIL) (2.5 MG/3ML) 0.083% nebulizer solution   fluticasone furoate-vilanterol (BREO ELLIPTA) 200-25 MCG/INH AEPB    Other Visit Diagnoses    Generalized abdominal pain    -  Primary   Will check labs. Await results. Start probiotic. Let me know if not improving.    Relevant Orders   CBC with Differential/Platelet   Comprehensive metabolic panel   UA/M w/rflx Culture, Routine (Completed)       Follow up plan: Return in about 4 weeks (around 05/25/2016).

## 2016-04-27 NOTE — Patient Instructions (Signed)
Lactobacillis, acidophilus

## 2016-04-27 NOTE — Assessment & Plan Note (Signed)
Coupon card for breo given today. Call if still too expensive. Call with any concerns, recheck 1 month.

## 2016-04-28 LAB — CBC WITH DIFFERENTIAL/PLATELET
BASOS ABS: 0.1 10*3/uL (ref 0.0–0.2)
BASOS: 1 %
EOS (ABSOLUTE): 0.4 10*3/uL (ref 0.0–0.4)
Eos: 4 %
HEMOGLOBIN: 12.2 g/dL (ref 11.1–15.9)
Hematocrit: 35.8 % (ref 34.0–46.6)
Immature Grans (Abs): 0 10*3/uL (ref 0.0–0.1)
Immature Granulocytes: 0 %
LYMPHS ABS: 2.5 10*3/uL (ref 0.7–3.1)
Lymphs: 27 %
MCH: 30.7 pg (ref 26.6–33.0)
MCHC: 34.1 g/dL (ref 31.5–35.7)
MCV: 90 fL (ref 79–97)
MONOCYTES: 8 %
Monocytes Absolute: 0.8 10*3/uL (ref 0.1–0.9)
NEUTROS ABS: 5.5 10*3/uL (ref 1.4–7.0)
Neutrophils: 60 %
Platelets: 308 10*3/uL (ref 150–379)
RBC: 3.97 x10E6/uL (ref 3.77–5.28)
RDW: 12.7 % (ref 12.3–15.4)
WBC: 9.3 10*3/uL (ref 3.4–10.8)

## 2016-04-28 LAB — COMPREHENSIVE METABOLIC PANEL
ALBUMIN: 4 g/dL (ref 3.5–5.5)
ALK PHOS: 79 IU/L (ref 39–117)
ALT: 15 IU/L (ref 0–32)
AST: 11 IU/L (ref 0–40)
Albumin/Globulin Ratio: 1.7 (ref 1.2–2.2)
BILIRUBIN TOTAL: 0.3 mg/dL (ref 0.0–1.2)
BUN / CREAT RATIO: 14 (ref 9–23)
BUN: 9 mg/dL (ref 6–20)
CHLORIDE: 101 mmol/L (ref 96–106)
CO2: 25 mmol/L (ref 18–29)
Calcium: 8.9 mg/dL (ref 8.7–10.2)
Creatinine, Ser: 0.63 mg/dL (ref 0.57–1.00)
GFR calc non Af Amer: 115 mL/min/{1.73_m2} (ref >59)
GFR, EST AFRICAN AMERICAN: 133 mL/min/{1.73_m2} (ref >59)
GLOBULIN, TOTAL: 2.3 g/dL (ref 1.5–4.5)
GLUCOSE: 74 mg/dL (ref 65–99)
POTASSIUM: 4 mmol/L (ref 3.5–5.2)
SODIUM: 138 mmol/L (ref 134–144)
Total Protein: 6.3 g/dL (ref 6.0–8.5)

## 2016-05-01 ENCOUNTER — Encounter: Payer: Self-pay | Admitting: Family Medicine

## 2016-05-13 ENCOUNTER — Other Ambulatory Visit: Payer: Self-pay | Admitting: Family Medicine

## 2016-05-15 ENCOUNTER — Encounter: Payer: Self-pay | Admitting: Family Medicine

## 2016-05-16 ENCOUNTER — Ambulatory Visit (INDEPENDENT_AMBULATORY_CARE_PROVIDER_SITE_OTHER): Payer: BLUE CROSS/BLUE SHIELD | Admitting: Family Medicine

## 2016-05-16 ENCOUNTER — Encounter: Payer: Self-pay | Admitting: Family Medicine

## 2016-05-16 VITALS — BP 99/68 | HR 85 | Temp 99.3°F | Wt 131.0 lb

## 2016-05-16 DIAGNOSIS — J101 Influenza due to other identified influenza virus with other respiratory manifestations: Secondary | ICD-10-CM

## 2016-05-16 LAB — VERITOR FLU A/B WAIVED
Influenza A: NEGATIVE
Influenza B: POSITIVE — AB

## 2016-05-16 MED ORDER — OSELTAMIVIR PHOSPHATE 75 MG PO CAPS: 75.0000 mg | ORAL_CAPSULE | Freq: Two times a day (BID) | ORAL | 0 refills | Status: DC

## 2016-05-16 NOTE — Progress Notes (Signed)
BP 99/68   Pulse 85   Temp 99.3 F (37.4 C)   Wt 131 lb (59.4 kg)   LMP 04/17/2016   SpO2 98%   BMI 20.52 kg/m    Subjective:    Patient ID: Sheila Benton, female    DOB: 1981-01-13, 36 y.o.   MRN: [id number]  HPI: Sheila Benton is a 36 y.o. female  Chief Complaint  Patient presents with  . URI    x 2 days, fever, sore throat, non productive cough, chest congestion, right ear pain yesterday, body aches, chills.   Patient presents with 2 day history of fever, chills, body aches, sore throat, non-productive cough, congestion, right ear pain. Denies N/V/D, CP, SOB. Taking nyquil and dayquil with some temporary relief. States many sick contacts over the past 2 weeks.   Past Medical History:  Diagnosis Date  . Anxiety   . Asthma    Social History   Social History  . Marital status: Married    Spouse name: N/A  . Number of children: N/A  . Years of education: N/A   Occupational History  . Not on file.   Social History Main Topics  . Smoking status: Current Every Day Smoker    Types: E-cigarettes  . Smokeless tobacco: Never Used  . Alcohol use No  . Drug use: No  . Sexual activity: Yes     Comment: Tubes Tied   Other Topics Concern  . Not on file   Social History Narrative  . No narrative on file    Relevant past medical, surgical, family and social history reviewed and updated as indicated. Interim medical history since our last visit reviewed. Allergies and medications reviewed and updated.  Review of Systems  Constitutional: Positive for chills, diaphoresis, fatigue and fever.  HENT: Positive for congestion, ear pain and sore throat.   Eyes: Negative.   Respiratory: Positive for cough.   Cardiovascular: Negative.   Gastrointestinal: Negative.   Genitourinary: Negative.   Musculoskeletal: Positive for myalgias.  Neurological: Negative.     Per HPI unless specifically indicated above     Objective:    BP 99/68   Pulse 85   Temp 99.3 F (37.4  C)   Wt 131 lb (59.4 kg)   LMP 04/17/2016   SpO2 98%   BMI 20.52 kg/m   Wt Readings from Last 3 Encounters:  05/16/16 131 lb (59.4 kg)  04/27/16 132 lb (59.9 kg)  01/08/16 130 lb (59 kg)    Physical Exam  Constitutional: She is oriented to person, place, and time. She appears well-developed and well-nourished. No distress.  HENT:  Head: Atraumatic.  Nasal mucosa erythematous Oropharynx mildly edematous  Eyes: Conjunctivae are normal. Pupils are equal, round, and reactive to light. No scleral icterus.  Neck: Normal range of motion. Neck supple.  Cardiovascular: Normal rate and normal heart sounds.   Pulmonary/Chest: Effort normal and breath sounds normal. No respiratory distress.  Musculoskeletal: Normal range of motion.  Neurological: She is alert and oriented to person, place, and time.  Skin: Skin is warm and dry.  Psychiatric: She has a normal mood and affect. Her behavior is normal.  Nursing note and vitals reviewed.     Assessment & Plan:   Problem List Items Addressed This Visit    None    Visit Diagnoses    Influenza B    -  Primary   Tamiflu sent, continue dayquil and nyquil prn. Rest, good hydration. Follow up if worsening or no  improvement   Relevant Medications   oseltamivir (TAMIFLU) 75 MG capsule   Other Relevant Orders   Influenza A & B (STAT) (Completed)       Follow up plan: Return if symptoms worsen or fail to improve.

## 2016-05-16 NOTE — Patient Instructions (Signed)
Follow up as needed

## 2016-05-21 ENCOUNTER — Other Ambulatory Visit: Payer: Self-pay | Admitting: Family Medicine

## 2016-05-21 ENCOUNTER — Encounter: Payer: Self-pay | Admitting: Family Medicine

## 2016-05-21 NOTE — Telephone Encounter (Signed)
Return to work note printed and faxed.

## 2016-05-21 NOTE — Telephone Encounter (Signed)
Patient is requesting a letter releasing her to work this morning to be faxed to the below number. Thank you!  Fax Number [phone] Attn: Rory Percy

## 2016-05-23 ENCOUNTER — Other Ambulatory Visit: Payer: Self-pay | Admitting: Family Medicine

## 2016-05-23 MED ORDER — ALBUTEROL SULFATE HFA 108 (90 BASE) MCG/ACT IN AERS: 1.0000 | INHALATION_SPRAY | Freq: Four times a day (QID) | RESPIRATORY_TRACT | 0 refills | Status: DC | PRN

## 2016-05-23 NOTE — Telephone Encounter (Signed)
Routing to provider  

## 2016-05-28 ENCOUNTER — Encounter: Payer: Self-pay | Admitting: Family Medicine

## 2016-05-28 ENCOUNTER — Ambulatory Visit (INDEPENDENT_AMBULATORY_CARE_PROVIDER_SITE_OTHER): Payer: BLUE CROSS/BLUE SHIELD | Admitting: Family Medicine

## 2016-05-28 VITALS — BP 100/64 | HR 71 | Temp 98.2°F | Resp 17 | Ht 67.0 in | Wt 132.0 lb

## 2016-05-28 DIAGNOSIS — J4540 Moderate persistent asthma, uncomplicated: Secondary | ICD-10-CM | POA: Diagnosis not present

## 2016-05-28 DIAGNOSIS — N898 Other specified noninflammatory disorders of vagina: Secondary | ICD-10-CM

## 2016-05-28 LAB — MICROSCOPIC EXAMINATION
Bacteria, UA: NONE SEEN
RBC MICROSCOPIC, UA: NONE SEEN /HPF (ref 0–?)

## 2016-05-28 LAB — WET PREP FOR TRICH, YEAST, CLUE
CLUE CELL EXAM: NEGATIVE
Trichomonas Exam: NEGATIVE
Yeast Exam: NEGATIVE

## 2016-05-28 LAB — UA/M W/RFLX CULTURE, ROUTINE
BILIRUBIN UA: NEGATIVE
GLUCOSE, UA: NEGATIVE
KETONES UA: NEGATIVE
Leukocytes, UA: NEGATIVE
Nitrite, UA: NEGATIVE
Protein, UA: NEGATIVE
RBC UA: NEGATIVE
SPEC GRAV UA: 1.015 (ref 1.005–1.030)
UUROB: 0.2 mg/dL (ref 0.2–1.0)
pH, UA: 8.5 — ABNORMAL HIGH (ref 5.0–7.5)

## 2016-05-28 NOTE — Progress Notes (Signed)
BP 100/64 (BP Location: Left Arm, Patient Position: Sitting, Cuff Size: Normal)   Pulse 71   Temp 98.2 F (36.8 C) (Oral)   Resp 17   Ht 5\' 7"  (1.702 m)   Wt 132 lb (59.9 kg)   LMP 05/19/2016   SpO2 98%   BMI 20.67 kg/m    Subjective:    Patient ID: Sheila Benton, female    DOB: 04/11/1980, 36 y.o.   MRN: [id number]  HPI: Sheila Benton is a 36 y.o. female  Chief Complaint  Patient presents with  . Asthma   ASTHMA Asthma status: better Satisfied with current treatment?: yes Albuterol/rescue inhaler frequency: rarely Dyspnea frequency: rarely Wheezing frequency: rarely Cough frequency: rarely Nocturnal symptom frequency: occasional  Limitation of activity: no Current upper respiratory symptoms: no Triggers: smoke, allergies, cold Failed/intolerant to following asthma meds: none Asthma meds in past: breo, flovent, albuterol, advair Aerochamber/spacer use: yes Visits to ER or Urgent Care in past year: no Pneumovax: Not up to Date Influenza: Not up to Date  Started her period on the 24th- week early, happened while she was having sex, full period for about a day, then spotty, still happening, usually 3-4 days with 2 days spotting. Now it's more just there when she's wiping and a discharge and cramping on her R side and lower back. No other pain in her back. Discharge more brown and mucousy.   Relevant past medical, surgical, family and social history reviewed and updated as indicated. Interim medical history since our last visit reviewed. Allergies and medications reviewed and updated.  Review of Systems  Constitutional: Negative.   Respiratory: Negative.   Cardiovascular: Negative.   Genitourinary: Positive for menstrual problem, pelvic pain, vaginal bleeding and vaginal discharge. Negative for decreased urine volume, difficulty urinating, dyspareunia, dysuria, enuresis, flank pain, frequency, genital sores, hematuria, urgency and vaginal pain.    Psychiatric/Behavioral: Negative.     Per HPI unless specifically indicated above     Objective:    BP 100/64 (BP Location: Left Arm, Patient Position: Sitting, Cuff Size: Normal)   Pulse 71   Temp 98.2 F (36.8 C) (Oral)   Resp 17   Ht 5\' 7"  (1.702 m)   Wt 132 lb (59.9 kg)   LMP 05/19/2016   SpO2 98%   BMI 20.67 kg/m   Wt Readings from Last 3 Encounters:  05/28/16 132 lb (59.9 kg)  05/16/16 131 lb (59.4 kg)  04/27/16 132 lb (59.9 kg)    Physical Exam  Constitutional: She is oriented to person, place, and time. She appears well-developed and well-nourished. No distress.  HENT:  Head: Normocephalic and atraumatic.  Right Ear: Hearing normal.  Left Ear: Hearing normal.  Nose: Nose normal.  Eyes: Conjunctivae and lids are normal. Right eye exhibits no discharge. Left eye exhibits no discharge. No scleral icterus.  Cardiovascular: Normal rate, regular rhythm, normal heart sounds and intact distal pulses.  Exam reveals no gallop and no friction rub.   No murmur heard. Pulmonary/Chest: Effort normal and breath sounds normal. No respiratory distress. She has no wheezes. She has no rales. She exhibits no tenderness.  Abdominal: Hernia confirmed negative in the right inguinal area and confirmed negative in the left inguinal area.  Genitourinary: No labial fusion. There is no rash, tenderness, lesion or injury on the right labia. There is no rash, tenderness, lesion or injury on the left labia. No erythema, tenderness or bleeding in the vagina. No foreign body in the vagina. No signs of injury around  the vagina. Vaginal discharge found.  Musculoskeletal: Normal range of motion.  Neurological: She is alert and oriented to person, place, and time.  Skin: Skin is warm, dry and intact. No rash noted. No erythema. No pallor.  Psychiatric: She has a normal mood and affect. Her speech is normal and behavior is normal. Judgment and thought content normal. Cognition and memory are normal.   Nursing note and vitals reviewed.   Results for orders placed or performed in visit on 05/16/16  Influenza A & B (STAT)  Result Value Ref Range   Influenza A Negative Negative   Influenza B Positive (A) Negative      Assessment & Plan:   Problem List Items Addressed This Visit      Respiratory   Asthma - Primary    Under good control. Continue current regimen. Continue to monitor. Recheck 6 months.        Other Visit Diagnoses    Vaginal discharge       Negative UA and negative wet prep. Continue to monitor. If getting worse or comes back, return for further evaluation.    Relevant Orders   WET PREP FOR [place], YEAST, CLUE   UA/M w/rflx Culture, Routine       Follow up plan: Return in about 6 months (around 11/27/2016) for Physical.

## 2016-05-28 NOTE — Assessment & Plan Note (Signed)
Under good control. Continue current regimen. Continue to monitor. Recheck 6 months.  

## 2016-05-29 ENCOUNTER — Ambulatory Visit (INDEPENDENT_AMBULATORY_CARE_PROVIDER_SITE_OTHER): Payer: BLUE CROSS/BLUE SHIELD | Admitting: Family Medicine

## 2016-05-29 ENCOUNTER — Encounter: Payer: Self-pay | Admitting: Family Medicine

## 2016-05-29 VITALS — BP 105/68 | HR 82 | Temp 98.1°F | Resp 17 | Ht 67.0 in | Wt 133.0 lb

## 2016-05-29 DIAGNOSIS — N760 Acute vaginitis: Secondary | ICD-10-CM

## 2016-05-29 DIAGNOSIS — N939 Abnormal uterine and vaginal bleeding, unspecified: Secondary | ICD-10-CM | POA: Diagnosis not present

## 2016-05-29 DIAGNOSIS — R102 Pelvic and perineal pain: Secondary | ICD-10-CM | POA: Diagnosis not present

## 2016-05-29 DIAGNOSIS — B9689 Other specified bacterial agents as the cause of diseases classified elsewhere: Secondary | ICD-10-CM

## 2016-05-29 LAB — UA/M W/RFLX CULTURE, ROUTINE
BILIRUBIN UA: NEGATIVE
GLUCOSE, UA: NEGATIVE
Leukocytes, UA: NEGATIVE
NITRITE UA: NEGATIVE
PH UA: 7.5 (ref 5.0–7.5)
RBC UA: NEGATIVE
Specific Gravity, UA: 1.015 (ref 1.005–1.030)
UUROB: 1 mg/dL (ref 0.2–1.0)

## 2016-05-29 LAB — WET PREP FOR TRICH, YEAST, CLUE
Clue Cell Exam: POSITIVE — AB
TRICHOMONAS EXAM: NEGATIVE
Yeast Exam: NEGATIVE

## 2016-05-29 LAB — MICROSCOPIC EXAMINATION
BACTERIA UA: NONE SEEN
RBC, UA: NONE SEEN /hpf (ref 0–?)
WBC UA: NONE SEEN /HPF (ref 0–?)

## 2016-05-29 LAB — PREGNANCY, URINE: PREG TEST UR: NEGATIVE

## 2016-05-29 MED ORDER — METRONIDAZOLE 500 MG PO TABS: 500.0000 mg | ORAL_TABLET | Freq: Two times a day (BID) | ORAL | 0 refills | Status: DC

## 2016-05-29 NOTE — Telephone Encounter (Signed)
Can we please see if we can get her scheduled for an appointment ASAP?

## 2016-05-29 NOTE — Progress Notes (Signed)
BP 105/68 (BP Location: Left Arm, Patient Position: Sitting, Cuff Size: Normal)   Pulse 82   Temp 98.1 F (36.7 C) (Oral)   Resp 17   Ht 5\' 7"  (1.702 m)   Wt 133 lb (60.3 kg)   LMP 05/19/2016   SpO2 97%   BMI 20.83 kg/m    Subjective:    Patient ID: Sheila Benton, female    DOB: August 16, 1980, 36 y.o.   MRN: [id number]  HPI: Sheila Benton is a 36 y.o. female  Chief Complaint  Patient presents with  . Vaginal Discharge    blood and tissue  . Flank Pain  . Back Pain    lower   Period started a week early. She notes that she is passing stuff that looks like tissue- passed a little bit every time she goes to the bathroom. She notes that she is having a lot of pressure. She is still having a lot of pain in her low back. She notes that she has a numbing pain pain all the time and then sharper pains that come and go, not like contractions. Period last month was normal. Period the month before that it was a little late  Relevant past medical, surgical, family and social history reviewed and updated as indicated. Interim medical history since our last visit reviewed. Allergies and medications reviewed and updated.  Review of Systems  Constitutional: Negative.   Respiratory: Negative.   Cardiovascular: Negative.   Gastrointestinal: Positive for abdominal distention and abdominal pain. Negative for anal bleeding, blood in stool, constipation, diarrhea, nausea, rectal pain and vomiting.  Genitourinary: Positive for menstrual problem, pelvic pain, vaginal bleeding and vaginal discharge. Negative for decreased urine volume, difficulty urinating, dyspareunia, dysuria, enuresis, flank pain, frequency, genital sores, hematuria, urgency and vaginal pain.  Musculoskeletal: Positive for back pain and myalgias. Negative for arthralgias, gait problem, joint swelling, neck pain and neck stiffness.  Psychiatric/Behavioral: Negative.     Per HPI unless specifically indicated above     Objective:      BP 105/68 (BP Location: Left Arm, Patient Position: Sitting, Cuff Size: Normal)   Pulse 82   Temp 98.1 F (36.7 C) (Oral)   Resp 17   Ht 5\' 7"  (1.702 m)   Wt 133 lb (60.3 kg)   LMP 05/19/2016   SpO2 97%   BMI 20.83 kg/m   Wt Readings from Last 3 Encounters:  05/29/16 133 lb (60.3 kg)  05/28/16 132 lb (59.9 kg)  05/16/16 131 lb (59.4 kg)    Physical Exam  Constitutional: She is oriented to person, place, and time. She appears well-developed and well-nourished. No distress.  HENT:  Head: Normocephalic and atraumatic.  Right Ear: Hearing normal.  Left Ear: Hearing normal.  Nose: Nose normal.  Eyes: Conjunctivae and lids are normal. Right eye exhibits no discharge. Left eye exhibits no discharge. No scleral icterus.  Pulmonary/Chest: Effort normal. No respiratory distress.  Abdominal: Soft. She exhibits no distension and no mass. There is no tenderness. There is no rebound and no guarding. Hernia confirmed negative in the right inguinal area and confirmed negative in the left inguinal area.  Genitourinary: No labial fusion. There is no rash, tenderness, lesion or injury on the right labia. There is no rash, tenderness, lesion or injury on the left labia. There is bleeding in the vagina. No erythema or tenderness in the vagina. No foreign body in the vagina. No signs of injury around the vagina. Vaginal discharge found.  Musculoskeletal: Normal range of  motion.  Neurological: She is alert and oriented to person, place, and time.  Skin: Skin is warm, dry and intact. No rash noted. No erythema. No pallor.  Psychiatric: She has a normal mood and affect. Her speech is normal and behavior is normal. Judgment and thought content normal. Cognition and memory are normal.  Nursing note and vitals reviewed.   Results for orders placed or performed in visit on 05/28/16  WET PREP FOR [place], YEAST, CLUE  Result Value Ref Range   Trichomonas Exam Negative Negative   Yeast Exam Negative  Negative   Clue Cell Exam Negative Negative  Microscopic Examination  Result Value Ref Range   WBC, UA 0-5 0 - 5 /hpf   RBC, UA None seen 0 - 2 /hpf   Epithelial Cells (non renal) 0-10 0 - 10 /hpf   Bacteria, UA None seen None seen/Few  UA/M w/rflx Culture, Routine  Result Value Ref Range   Specific Gravity, UA 1.015 1.005 - 1.030   pH, UA 8.5 (H) 5.0 - 7.5   Color, UA Yellow Yellow   Appearance Ur Clear Clear   Leukocytes, UA Negative Negative   Protein, UA Negative Negative/Trace   Glucose, UA Negative Negative   Ketones, UA Negative Negative   RBC, UA Negative Negative   Bilirubin, UA Negative Negative   Urobilinogen, Ur 0.2 0.2 - 1.0 mg/dL   Nitrite, UA Negative Negative   Microscopic Examination See below:       Assessment & Plan:   Problem List Items Addressed This Visit    None    Visit Diagnoses    Vaginal bleeding    -  Primary   Normal exam except for some discharge- +BV, will treat with flagyl. Call with any concerns. Will obtain US- await results.   Relevant Orders   UA/M w/rflx Culture, Routine   Pregnancy, urine   WET PREP FOR [place], YEAST, CLUE   US Pelvis Limited   US Transvaginal Non-OB   Pelvic pain       Normal exam except for some discharge- +BV, will treat with flagyl. Call with any concerns. Will obtain US- await results.    Relevant Orders   US Pelvis Limited   US Transvaginal Non-OB   BV (bacterial vaginosis)       Will treat with flagyl. Call with any concerns.    Relevant Medications   metroNIDAZOLE (FLAGYL) 500 MG tablet       Follow up plan: Return if symptoms worsen or fail to improve.

## 2016-05-29 NOTE — Patient Instructions (Addendum)
Bacterial Vaginosis Bacterial vaginosis is a vaginal infection that occurs when the normal balance of bacteria in the vagina is disrupted. It results from an overgrowth of certain bacteria. This is the most common vaginal infection among women ages 15-44. Because bacterial vaginosis increases your risk for STIs (sexually transmitted infections), getting treated can help reduce your risk for chlamydia, gonorrhea, herpes, and HIV (human immunodeficiency virus). Treatment is also important for preventing complications in pregnant women, because this condition can cause an early (premature) delivery. What are the causes? This condition is caused by an increase in harmful bacteria that are normally present in small amounts in the vagina. However, the reason that the condition develops is not fully understood. What increases the risk? The following factors may make you more likely to develop this condition:  Having a new sexual partner or multiple sexual partners.  Having unprotected sex.  Douching.  Having an intrauterine device (IUD).  Smoking.  Drug and alcohol abuse.  Taking certain antibiotic medicines.  Being pregnant.  You cannot get bacterial vaginosis from toilet seats, bedding, swimming pools, or contact with objects around you. What are the signs or symptoms? Symptoms of this condition include:  Grey or white vaginal discharge. The discharge can also be watery or foamy.  A fish-like odor with discharge, especially after sexual intercourse or during menstruation.  Itching in and around the vagina.  Burning or pain with urination.  Some women with bacterial vaginosis have no signs or symptoms. How is this diagnosed? This condition is diagnosed based on:  Your medical history.  A physical exam of the vagina.  Testing a sample of vaginal fluid under a microscope to look for a large amount of bad bacteria or abnormal cells. Your health care provider may use a cotton swab  or a small wooden spatula to collect the sample.  How is this treated? This condition is treated with antibiotics. These may be given as a pill, a vaginal cream, or a medicine that is put into the vagina (suppository). If the condition comes back after treatment, a second round of antibiotics may be needed. Follow these instructions at home: Medicines  Take over-the-counter and prescription medicines only as told by your health care provider.  Take or use your antibiotic as told by your health care provider. Do not stop taking or using the antibiotic even if you start to feel better. General instructions  If you have a female sexual partner, tell her that you have a vaginal infection. She should see her health care provider and be treated if she has symptoms. If you have a female sexual partner, he does not need treatment.  During treatment: ? Avoid sexual activity until you finish treatment. ? Do not douche. ? Avoid alcohol as directed by your health care provider. ? Avoid breastfeeding as directed by your health care provider.  Drink enough water and fluids to keep your urine clear or pale yellow.  Keep the area around your vagina and rectum clean. ? Wash the area daily with warm water. ? Wipe yourself from front to back after using the toilet.  Keep all follow-up visits as told by your health care provider. This is important. How is this prevented?  Do not douche.  Wash the outside of your vagina with warm water only.  Use protection when having sex. This includes latex condoms and dental dams.  Limit how many sexual partners you have. To help prevent bacterial vaginosis, it is best to have sex with just   one partner (monogamous).  Make sure you and your sexual partner are tested for STIs.  Wear cotton or cotton-lined underwear.  Avoid wearing tight pants and pantyhose, especially during summer.  Limit the amount of alcohol that you drink.  Do not use any products that  contain nicotine or tobacco, such as cigarettes and e-cigarettes. If you need help quitting, ask your health care provider.  Do not use illegal drugs. Where to find more information:  Centers for Disease Control and Prevention: www.cdc.gov/std  American Sexual Health Association (ASHA): www.ashastd.org  [place] Department of Health and Human Services, Office on Women's Health: www.womenshealth.gov/ or https://www.womenshealth.gov/a-z-topics/bacterial-vaginosis Contact a health care provider if:  Your symptoms do not improve, even after treatment.  You have more discharge or pain when urinating.  You have a fever.  You have pain in your abdomen.  You have pain during sex.  You have vaginal bleeding between periods. Summary  Bacterial vaginosis is a vaginal infection that occurs when the normal balance of bacteria in the vagina is disrupted.  Because bacterial vaginosis increases your risk for STIs (sexually transmitted infections), getting treated can help reduce your risk for chlamydia, gonorrhea, herpes, and HIV (human immunodeficiency virus). Treatment is also important for preventing complications in pregnant women, because the condition can cause an early (premature) delivery.  This condition is treated with antibiotic medicines. These may be given as a pill, a vaginal cream, or a medicine that is put into the vagina (suppository). This information is not intended to replace advice given to you by your health care provider. Make sure you discuss any questions you have with your health care provider. Document Released: 02/12/2005 Document Revised: 10/29/2015 Document Reviewed: 10/29/2015 Elsevier Interactive Patient Education  2017 [employer]  

## 2016-05-30 ENCOUNTER — Telehealth: Payer: Self-pay | Admitting: Family Medicine

## 2016-05-30 NOTE — Telephone Encounter (Signed)
LVM for patient of appointment. Said if patient had any questions to call us.

## 2016-06-04 ENCOUNTER — Telehealth: Payer: Self-pay | Admitting: Family Medicine

## 2016-06-04 ENCOUNTER — Other Ambulatory Visit: Payer: Self-pay | Admitting: Family Medicine

## 2016-06-04 ENCOUNTER — Ambulatory Visit
Admission: RE | Admit: 2016-06-04 | Discharge: 2016-06-04 | Disposition: A | Discharge status: Home / Self Care | Payer: BLUE CROSS/BLUE SHIELD | Source: Ambulatory Visit | Attending: Family Medicine | Admitting: Family Medicine

## 2016-06-04 DIAGNOSIS — N939 Abnormal uterine and vaginal bleeding, unspecified: Secondary | ICD-10-CM

## 2016-06-04 DIAGNOSIS — N83201 Unspecified ovarian cyst, right side: Secondary | ICD-10-CM | POA: Diagnosis not present

## 2016-06-04 DIAGNOSIS — R102 Pelvic and perineal pain: Secondary | ICD-10-CM

## 2016-06-04 NOTE — Telephone Encounter (Signed)
Called to let patient know that her [place] came back normal. Will send her mychart message

## 2016-06-05 ENCOUNTER — Telehealth: Payer: Self-pay | Admitting: Family Medicine

## 2016-06-05 ENCOUNTER — Encounter: Payer: Self-pay | Admitting: Family Medicine

## 2016-06-05 DIAGNOSIS — R1031 Right lower quadrant pain: Secondary | ICD-10-CM

## 2016-06-05 DIAGNOSIS — N926 Irregular menstruation, unspecified: Secondary | ICD-10-CM

## 2016-06-05 NOTE — Telephone Encounter (Signed)
Patient called back and discussed results which were normal. Discussed options including monitoring, OCP, referral to GYN. She would like to see GYN. Referral generated today.

## 2016-06-05 NOTE — Telephone Encounter (Signed)
Called patient as requested in mychart. LMOM for her to call back.

## 2016-06-06 ENCOUNTER — Encounter: Payer: Self-pay | Admitting: Family Medicine

## 2016-06-07 ENCOUNTER — Telehealth: Payer: Self-pay | Admitting: Obstetrics & Gynecology

## 2016-06-07 NOTE — Telephone Encounter (Signed)
Pt is being referred by [facility] family practice for Abnormal menses abnormal [place]. Left voicemail for pt to call back to be reschedule.

## 2016-06-07 NOTE — Telephone Encounter (Signed)
Pt is schedule with Dr. Kenton Kingfisher 07/03/16

## 2016-06-14 ENCOUNTER — Encounter: Payer: Self-pay | Admitting: Family Medicine

## 2016-06-14 ENCOUNTER — Ambulatory Visit (INDEPENDENT_AMBULATORY_CARE_PROVIDER_SITE_OTHER): Payer: BLUE CROSS/BLUE SHIELD | Admitting: Family Medicine

## 2016-06-14 VITALS — BP 104/66 | HR 74 | Temp 98.0°F | Wt 133.8 lb

## 2016-06-14 DIAGNOSIS — F411 Generalized anxiety disorder: Secondary | ICD-10-CM | POA: Diagnosis not present

## 2016-06-14 DIAGNOSIS — M5441 Lumbago with sciatica, right side: Secondary | ICD-10-CM | POA: Diagnosis not present

## 2016-06-14 MED ORDER — NAPROXEN 500 MG PO TABS: 500.0000 mg | ORAL_TABLET | Freq: Two times a day (BID) | ORAL | 1 refills | Status: DC

## 2016-06-14 MED ORDER — FLUCONAZOLE 150 MG PO TABS: 150.0000 mg | ORAL_TABLET | Freq: Once | ORAL | 0 refills | Status: AC

## 2016-06-14 MED ORDER — CYCLOBENZAPRINE HCL 10 MG PO TABS: 10.0000 mg | ORAL_TABLET | Freq: Three times a day (TID) | ORAL | 1 refills | Status: DC | PRN

## 2016-06-14 NOTE — Patient Instructions (Addendum)
Sciatica Rehab Ask your health care provider which exercises are safe for you. Do exercises exactly as told by your health care provider and adjust them as directed. It is normal to feel mild stretching, pulling, tightness, or discomfort as you do these exercises, but you should stop right away if you feel sudden pain or your pain gets worse.Do not begin these exercises until told by your health care provider. Stretching and range of motion exercises These exercises warm up your muscles and joints and improve the movement and flexibility of your hips and your back. These exercises also help to relieve pain, numbness, and tingling. Exercise A: Sciatic nerve glide  1. Sit in a chair with your head facing down toward your chest. Place your hands behind your back. Let your shoulders slump forward. 2. Slowly straighten one of your knees while you tilt your head back as if you are looking toward the ceiling. Only straighten your leg as far as you can without making your symptoms worse. 3. Hold for __________ seconds. 4. Slowly return to the starting position. 5. Repeat with your other leg. Repeat __________ times. Complete this exercise __________ times a day. Exercise B: Knee to chest with hip adduction and internal rotation    1. Lie on your back on a firm surface with both legs straight. 2. Bend one of your knees and move it up toward your chest until you feel a gentle stretch in your lower back and buttock. Then, move your knee toward the shoulder that is on the opposite side from your leg. ? Hold your leg in this position by holding onto the front of your knee. 3. Hold for __________ seconds. 4. Slowly return to the starting position. 5. Repeat with your other leg. Repeat __________ times. Complete this exercise __________ times a day. Exercise C: Prone extension on elbows    1. Lie on your abdomen on a firm surface. A bed may be too soft for this exercise. 2. Prop yourself up on your  elbows. 3. Use your arms to help lift your chest up until you feel a gentle stretch in your abdomen and your lower back. ? This will place some of your body weight on your elbows. If this is uncomfortable, try stacking pillows under your chest. ? Your hips should stay down, against the surface that you are lying on. Keep your hip and back muscles relaxed. 4. Hold for __________ seconds. 5. Slowly relax your upper body and return to the starting position. Repeat __________ times. Complete this exercise __________ times a day. Strengthening exercises These exercises build strength and endurance in your back. Endurance is the ability to use your muscles for a long time, even after they get tired. Exercise D: Pelvic tilt  1. Lie on your back on a firm surface. Bend your knees and keep your feet flat. 2. Tense your abdominal muscles. Tip your pelvis up toward the ceiling and flatten your lower back into the floor. ? To help with this exercise, you may place a small towel under your lower back and try to push your back into the towel. 3. Hold for __________ seconds. 4. Let your muscles relax completely before you repeat this exercise. Repeat __________ times. Complete this exercise __________ times a day. Exercise E: Alternating arm and leg raises    1. Get on your hands and knees on a firm surface. If you are on a hard floor, you may want to use padding to cushion your knees, such as   an exercise mat. 2. Line up your arms and legs. Your hands should be below your shoulders, and your knees should be below your hips. 3. Lift your left leg behind you. At the same time, raise your right arm and straighten it in front of you. ? Do not lift your leg higher than your hip. ? Do not lift your arm higher than your shoulder. ? Keep your abdominal and back muscles tight. ? Keep your hips facing the ground. ? Do not arch your back. ? Keep your balance carefully, and do not hold your breath. 4. Hold for  __________ seconds. 5. Slowly return to the starting position and repeat with your right leg and your left arm. Repeat __________ times. Complete this exercise __________ times a day. Posture and body mechanics    Body mechanics refers to the movements and positions of your body while you do your daily activities. Posture is part of body mechanics. Good posture and healthy body mechanics can help to relieve stress in your body's tissues and joints. Good posture means that your spine is in its natural S-curve position (your spine is neutral), your shoulders are pulled back slightly, and your head is not tipped forward. The following are general guidelines for applying improved posture and body mechanics to your everyday activities. Standing     When standing, keep your spine neutral and your feet about hip-width apart. Keep a slight bend in your knees. Your ears, shoulders, and hips should line up.  When you do a task in which you stand in one place for a long time, place one foot up on a stable object that is 2-4 inches (5-10 cm) high, such as a footstool. This helps keep your spine neutral. Sitting     When sitting, keep your spine neutral and keep your feet flat on the floor. Use a footrest, if necessary, and keep your thighs parallel to the floor. Avoid rounding your shoulders, and avoid tilting your head forward.  When working at a desk or a computer, keep your desk at a height where your hands are slightly lower than your elbows. Slide your chair under your desk so you are close enough to maintain good posture.  When working at a computer, place your monitor at a height where you are looking straight ahead and you do not have to tilt your head forward or downward to look at the screen. Resting     When lying down and resting, avoid positions that are most painful for you.  If you have pain with activities such as sitting, bending, stooping, or squatting (flexion-based  activities), lie in a position in which your body does not bend very much. For example, avoid curling up on your side with your arms and knees near your chest (fetal position).  If you have pain with activities such as standing for a long time or reaching with your arms (extension-based activities), lie with your spine in a neutral position and bend your knees slightly. Try the following positions: ? Lying on your side with a pillow between your knees. ? Lying on your back with a pillow under your knees. Lifting     When lifting objects, keep your feet at least shoulder-width apart and tighten your abdominal muscles.  Bend your knees and hips and keep your spine neutral. It is important to lift using the strength of your legs, not your back. Do not lock your knees straight out.  Always ask for help to lift heavy   or awkward objects. This information is not intended to replace advice given to you by your health care provider. Make sure you discuss any questions you have with your health care provider. Document Released: 02/12/2005 Document Revised: 10/20/2015 Document Reviewed: 10/29/2014 Elsevier Interactive Patient Education  2017 [employer]          

## 2016-06-14 NOTE — Assessment & Plan Note (Signed)
Will hold on changing doses until back pain is resolved. Continue to monitor.

## 2016-06-14 NOTE — Progress Notes (Addendum)
BP 104/66 (BP Location: Left Arm, Patient Position: Sitting, Cuff Size: Normal)   Pulse 74   Temp 98 F (36.7 C)   Wt 133 lb 12.8 oz (60.7 kg)   LMP 05/19/2016   SpO2 99%   BMI 20.96 kg/m    Subjective:    Patient ID: Sheila Benton, female    DOB: 1980-06-26, 36 y.o.   MRN: [id number]  HPI: Sheila Benton is a 36 y.o. female  Chief Complaint  Patient presents with  . Depression   BACK PAIN Duration: 2 weeks  Mechanism of injury: no trauma Location: bilateral and low back Onset: sudden Severity: 8-9/10 Quality: dull, aching Frequency: constant Radiation: R leg above the knee and L leg above the knee Aggravating factors:  Sitting, standing, everything Alleviating factors: nothing Status: worse Treatments attempted: rest, APAP, ibuprofen and aleve  Relief with NSAIDs?: mild Nighttime pain:  yes Paresthesias / decreased sensation:  no Bowel / bladder incontinence:  no Fevers:  no Dysuria / urinary frequency:  no  DEPRESSION Mood status: exacerbated Satisfied with current treatment?: no Symptom severity: moderate  Duration of current treatment : chronic Side effects: no Medication compliance: excellent compliance Psychotherapy/counseling: no  Previous psychiatric medications: celexa Depressed mood: yes Anxious mood: yes Anhedonia: no Significant weight loss or gain: no Insomnia: yes hard to stay asleep Fatigue: yes Feelings of worthlessness or guilt: no Impaired concentration/indecisiveness: no Suicidal ideations: no Hopelessness: no Crying spells: no Depression screen [facility] 2/9 06/14/2016 06/14/2016 12/15/2015 07/19/2015  Decreased Interest 2 2 0 2  Down, Depressed, Hopeless 1 0 0 0  PHQ - 2 Score 3 2 0 2  Altered sleeping 3 - - 3  Tired, decreased energy 3 - - 3  Change in appetite 0 - - 3  Feeling bad or failure about yourself  0 - - 0  Trouble concentrating 0 - - 3  Moving slowly or fidgety/restless 0 - - 0  Suicidal thoughts 0 - - 0  PHQ-9 Score 9 -  - 14   GAD 7 : Generalized Anxiety Score 06/14/2016 08/19/2015 07/19/2015 03/10/2015  Nervous, Anxious, on Edge 2 1 3 3   Control/stop worrying 3 1 3 3   Worry too much - different things 3 1 3 2   Trouble relaxing 3 2 3 2   Restless 3 0 3 1  Easily annoyed or irritable 3 2 3 3   Afraid - awful might happen 2 2 1 3   Total GAD 7 Score 19 9 19 17   Anxiety Difficulty Somewhat difficult Somewhat difficult Somewhat difficult Somewhat difficult    Relevant past medical, surgical, family and social history reviewed and updated as indicated. Interim medical history since our last visit reviewed. Allergies and medications reviewed and updated.  Review of Systems  Constitutional: Negative.   Respiratory: Negative.   Cardiovascular: Negative.   Psychiatric/Behavioral: Negative.     Per HPI unless specifically indicated above     Objective:    BP 104/66 (BP Location: Left Arm, Patient Position: Sitting, Cuff Size: Normal)   Pulse 74   Temp 98 F (36.7 C)   Wt 133 lb 12.8 oz (60.7 kg)   LMP 05/19/2016   SpO2 99%   BMI 20.96 kg/m   Wt Readings from Last 3 Encounters:  06/14/16 133 lb 12.8 oz (60.7 kg)  05/29/16 133 lb (60.3 kg)  05/28/16 132 lb (59.9 kg)    Physical Exam  Constitutional: She is oriented to person, place, and time. She appears well-developed and well-nourished. No distress.  HENT:  Head: Normocephalic and atraumatic.  Right Ear: Hearing normal.  Left Ear: Hearing normal.  Nose: Nose normal.  Eyes: Conjunctivae and lids are normal. Right eye exhibits no discharge. Left eye exhibits no discharge. No scleral icterus.  Cardiovascular: Normal rate, regular rhythm, normal heart sounds and intact distal pulses.  Exam reveals no gallop and no friction rub.   No murmur heard. Pulmonary/Chest: Effort normal and breath sounds normal. No respiratory distress. She has no wheezes. She has no rales. She exhibits no tenderness.  Musculoskeletal: Normal range of motion.  Neurological:  She is alert and oriented to person, place, and time.  Skin: Skin is warm, dry and intact. No rash noted. No erythema. No pallor.  Psychiatric: She has a normal mood and affect. Her speech is normal and behavior is normal. Judgment and thought content normal. Cognition and memory are normal.  Nursing note and vitals reviewed. Back Exam:    Inspection:  Normal spinal curvature.  No deformity, ecchymosis, erythema, or lesions     Palpation:     Midline spinal tenderness: no      Paralumbar tenderness: yes Right     Parathoracic tenderness: no      Buttocks tenderness: yesRight     Range of Motion:      Flexion: Normal     Extension:Decreased     Lateral bending:Decreased    Rotation:Decreased    Neuro Exam:Lower extremity DTRs normal & symmetric.  Strength and sensation intact.    Special Tests:      Straight leg raise:negative    Results for orders placed or performed in visit on 05/29/16  WET PREP FOR [place], YEAST, CLUE  Result Value Ref Range   Trichomonas Exam Negative Negative   Yeast Exam Negative Negative   Clue Cell Exam Positive (A) Negative  Microscopic Examination  Result Value Ref Range   WBC, UA None seen 0 - 5 /hpf   RBC, UA None seen 0 - 2 /hpf   Epithelial Cells (non renal) 0-10 0 - 10 /hpf   Bacteria, UA None seen None seen/Few  UA/M w/rflx Culture, Routine  Result Value Ref Range   Specific Gravity, UA 1.015 1.005 - 1.030   pH, UA 7.5 5.0 - 7.5   Color, UA Yellow Yellow   Appearance Ur Clear Clear   Leukocytes, UA Negative Negative   Protein, UA Trace (A) Negative/Trace   Glucose, UA Negative Negative   Ketones, UA Trace (A) Negative   RBC, UA Negative Negative   Bilirubin, UA Negative Negative   Urobilinogen, Ur 1.0 0.2 - 1.0 mg/dL   Nitrite, UA Negative Negative   Microscopic Examination See below:   Pregnancy, urine  Result Value Ref Range   Preg Test, Ur Negative Negative      Assessment & Plan:   Problem List Items Addressed This Visit        Other   Anxiety disorder    Will hold on changing doses until back pain is resolved. Continue to monitor.        Other Visit Diagnoses    Acute right-sided low back pain with right-sided sciatica    -  Primary   Will start flexeril and naproxen. Stretches given. Recheck 2 weeks, if not better will send for PT.    Relevant Medications   cyclobenzaprine (FLEXERIL) 10 MG tablet   naproxen (NAPROSYN) 500 MG tablet       Follow up plan: Return 2 weeks, for follow up back.

## 2016-07-03 ENCOUNTER — Encounter: Payer: Self-pay | Admitting: Obstetrics & Gynecology

## 2016-07-03 ENCOUNTER — Ambulatory Visit (INDEPENDENT_AMBULATORY_CARE_PROVIDER_SITE_OTHER): Payer: Self-pay | Admitting: Obstetrics & Gynecology

## 2016-07-03 VITALS — BP 110/70 | HR 89 | Ht 66.0 in | Wt 135.0 lb

## 2016-07-03 DIAGNOSIS — N930 Postcoital and contact bleeding: Secondary | ICD-10-CM

## 2016-07-03 DIAGNOSIS — N920 Excessive and frequent menstruation with regular cycle: Secondary | ICD-10-CM

## 2016-07-03 NOTE — Patient Instructions (Signed)
Total Laparoscopic Hysterectomy °A total laparoscopic hysterectomy is a minimally invasive surgery to remove your uterus and cervix. This surgery is performed by making several small cuts (incisions) in your abdomen. It can also be done with a thin, lighted tube (laparoscope) inserted into two small incisions in your lower abdomen. Your fallopian tubes and ovaries can be removed (bilateral salpingo-oophorectomy) during this surgery as well. Benefits of minimally invasive surgery include: °· Less pain. °· Less risk of blood loss. °· Less risk of infection. °· Quicker return to normal activities. ° °Tell a health care provider about: °· Any allergies you have. °· All medicines you are taking, including vitamins, herbs, eye drops, creams, and over-the-counter medicines. °· Any problems you or family members have had with anesthetic medicines. °· Any blood disorders you have. °· Any surgeries you have had. °· Any medical conditions you have. °What are the risks? °Generally, this is a safe procedure. However, as with any procedure, complications can occur. Possible complications include: °· Bleeding. °· Blood clots in the legs or lung. °· Infection. °· Injury to surrounding organs. °· Problems with anesthesia. °· Early menopause symptoms (hot flashes, night sweats, insomnia). °· Risk of conversion to an open abdominal incision. ° °What happens before the procedure? °· Ask your health care provider about changing or stopping your regular medicines. °· Do not take aspirin or blood thinners (anticoagulants) for 1 week before the surgery or as told by your health care provider. °· Do not eat or drink anything for 8 hours before the surgery or as told by your health care provider. °· Quit smoking if you smoke. °· Arrange for a ride home after surgery and for someone to help you at home during recovery. °What happens during the procedure? °· You will be given antibiotic medicine. °· An IV tube will be placed in your arm. You  will be given medicine to make you sleep (general anesthetic). °· A gas (carbon dioxide) will be used to inflate your abdomen. This will allow your surgeon to look inside your abdomen, perform your surgery, and treat any other problems found if necessary. °· Three or four small incisions (often less than 1/2 inch) will be made in your abdomen. One of these incisions will be made in the area of your belly button (navel). The laparoscope will be inserted into the incision. Your surgeon will look through the laparoscope while doing your procedure. °· Other surgical instruments will be inserted through the other incisions. °· Your uterus may be removed through your vagina or cut into small pieces and removed through the small incisions. °· Your incisions will be closed. °What happens after the procedure? °· The gas will be released from inside your abdomen. °· You will be taken to the recovery area where a nurse will watch and check your progress. Once you are awake, stable, and taking fluids well, without other problems, you will return to your room or be allowed to go home. °· There is usually minimal discomfort following the surgery because the incisions are so small. °· You will be given pain medicine while you are in the hospital and for when you go home. °This information is not intended to replace advice given to you by your health care provider. Make sure you discuss any questions you have with your health care provider. °Document Released: 12/10/2006 Document Revised: 07/21/2015 Document Reviewed: 09/02/2012 °Elsevier Interactive Patient Education © 2017 Elsevier Inc. ° °

## 2016-07-03 NOTE — Progress Notes (Signed)
Abdominal Pain Patient presents for evaluation of abdominal pain. The pain is described as aching, burning and shooting, and is 5/10 in intensity. Pain is located in the suprapubic area with radiation to the back. Onset was gradual occurring several years ago. Symptoms have been worse the last couple of months. Aggravating factors: activity and sex. Alleviating factors: none. Associated symptoms: period changes, post coital bleeding. The patient denies constipation, diarrhea, fever, nausea and vomiting. Risk factors for pelvic/abdominal pain include none.  Additional Complaints: Dysfunctional Uterine Bleeding Patient complains of irregular menses. She had been bleeding regularly. She is now bleeding every 28 days and menses are lasting 14 days. She changes her pad or tampon every a few hours. Clots are variable in size. Dysmenorrhea:moderate, occurring throughout menses. Cyclic symptoms include: pain, LBP. Current contraception: tubal ligation. History of infertility: no. History of abnormal Pap smear: no. ALSO, has freq postcoital bleeding that is bothersome to patient, and also has dyspareunia/   PMHx: She  has a past medical history of ADD (attention deficit disorder); Anemia; Anxiety; Asthma; Bipolar affective disorder, manic ([place]); and Irregular menses. Also,  has a past surgical history that includes Tubal ligation (12/24/2003)., family history includes Endometriosis in her sister; Heart disease in her maternal grandmother; Hypertension in her maternal grandmother and mother; Lung cancer in her maternal grandfather; Stroke in her maternal grandmother.,  reports that she has been smoking E-cigarettes.  She has never used smokeless tobacco. She reports that she does not drink alcohol or use drugs.  She has a current medication list which includes the following prescription(s): albuterol, albuterol, citalopram, cyclobenzaprine, fluticasone furoate-vilanterol, and naproxen. Also, is allergic to  omnipaque [iohexol].  Review of Systems  Constitutional: Negative for chills, fever and malaise/fatigue.  HENT: Negative for congestion, sinus pain and sore throat.   Eyes: Negative for blurred vision and pain.  Respiratory: Negative for cough and wheezing.   Cardiovascular: Negative for chest pain and leg swelling.  Gastrointestinal: Negative for abdominal pain, constipation, diarrhea, heartburn, nausea and vomiting.  Genitourinary: Negative for dysuria, frequency, hematuria and urgency.  Musculoskeletal: Negative for back pain, joint pain, myalgias and neck pain.  Skin: Negative for itching and rash.  Neurological: Negative for dizziness, tremors and weakness.  Endo/Heme/Allergies: Does not bruise/bleed easily.  Psychiatric/Behavioral: Negative for depression. The patient is not nervous/anxious and does not have insomnia.     Objective: BP 110/70   Pulse 89   Ht 5\' 6"  (1.676 m)   Wt 135 lb (61.2 kg)   LMP 06/15/2016   BMI 21.79 kg/m  Physical Exam  Constitutional: She is oriented to person, place, and time. She appears well-developed and well-nourished. No distress.  Genitourinary: Rectum normal, vagina normal and uterus normal. Pelvic exam was performed with patient supine. There is no rash or lesion on the right labia. There is no rash or lesion on the left labia. Vagina exhibits no lesion. No bleeding in the vagina. Right adnexum does not display mass and does not display tenderness. Left adnexum does not display mass and does not display tenderness. Cervix does not exhibit motion tenderness, lesion, friability or polyp.   Uterus is mobile and midaxial. Uterus is not enlarged or exhibiting a mass.  HENT:  Head: Normocephalic and atraumatic. Head is without laceration.  Right Ear: Hearing normal.  Left Ear: Hearing normal.  Nose: No epistaxis.  No foreign bodies.  Mouth/Throat: Uvula is midline, oropharynx is clear and moist and mucous membranes are normal.  Eyes: Pupils are  equal, round, and  reactive to light.  Neck: Normal range of motion. Neck supple. No thyromegaly present.  Cardiovascular: Normal rate and regular rhythm.  Exam reveals no gallop and no friction rub.   No murmur heard. Pulmonary/Chest: Effort normal and breath sounds normal. No respiratory distress. She has no wheezes. Right breast exhibits no mass, no skin change and no tenderness. Left breast exhibits no mass, no skin change and no tenderness.  Abdominal: Soft. Bowel sounds are normal. She exhibits no distension. There is no tenderness. There is no rebound.  Musculoskeletal: Normal range of motion.  Neurological: She is alert and oriented to person, place, and time. No cranial nerve deficit.  Skin: Skin is warm and dry.  Psychiatric: She has a normal mood and affect. Judgment normal.  Vitals reviewed.  See [place] from 05/28/16 See cultures from April (BV only) PAP normal 11/2015  ASSESSMENT/PLAN:    Problem List Items Addressed This Visit    None    Visit Diagnoses    Menorrhagia with regular cycle    -  Primary   Postcoital and contact bleeding        Surgery Options discussed, vs hormonal options to manipulate periods and thus maybe pain.  To consider.  Has tried OCPs in past without success.  Barnett Applebaum, MD, [facility], [facility] 07/03/2016  3:51 PM

## 2016-07-04 ENCOUNTER — Encounter: Payer: Self-pay | Admitting: Obstetrics & Gynecology

## 2016-07-05 ENCOUNTER — Telehealth: Payer: Self-pay | Admitting: Obstetrics & Gynecology

## 2016-07-05 ENCOUNTER — Other Ambulatory Visit: Payer: Self-pay | Admitting: Family Medicine

## 2016-07-05 NOTE — Telephone Encounter (Signed)
Lmtrc

## 2016-07-05 NOTE — Telephone Encounter (Signed)
Pt is calling about needing to know the date set for her up coming surgery. Please advise. CB# [phone]

## 2016-07-17 ENCOUNTER — Ambulatory Visit (INDEPENDENT_AMBULATORY_CARE_PROVIDER_SITE_OTHER): Payer: BLUE CROSS/BLUE SHIELD | Admitting: Obstetrics & Gynecology

## 2016-07-17 ENCOUNTER — Encounter: Payer: Self-pay | Admitting: Obstetrics & Gynecology

## 2016-07-17 VITALS — BP 110/70 | HR 76 | Ht 66.0 in | Wt 138.0 lb

## 2016-07-17 DIAGNOSIS — N930 Postcoital and contact bleeding: Secondary | ICD-10-CM | POA: Diagnosis not present

## 2016-07-17 DIAGNOSIS — N921 Excessive and frequent menstruation with irregular cycle: Secondary | ICD-10-CM

## 2016-07-17 DIAGNOSIS — N9419 Other specified dyspareunia: Secondary | ICD-10-CM

## 2016-07-17 NOTE — Progress Notes (Signed)
PRE-OPERATIVE HISTORY AND PHYSICAL EXAM  HPI:  Sheila Benton is a 36 y.o. G3P3000 No LMP recorded.; she is being admitted for surgery related to abnormal uterine bleeding and pelvic pain.   Patient has pain described as aching, burning and shooting, and is 5/10 in intensity. Pain is located in the suprapubic area with radiation to the back. Onset was gradual occurring several years ago. Symptoms have been worse the last couple of months. Aggravating factors: activity and sex. Alleviating factors: none. Associated symptoms: period changes, post coital bleeding. The patient denies constipation, diarrhea, fever, nausea and vomiting. Risk factors for pelvic/abdominal pain include none.  Patient complains of irregular menses. She had been bleeding regularly. She is now bleeding every 28 days and menses are lasting 14 days. She changes her pad or tampon every a few hours. Clots are variable in size. Dysmenorrhea:moderate, occurring throughout menses. Cyclic symptoms include: pain, LBP. Current contraception: tubal ligation. History of infertility: no. History of abnormal Pap smear: no. ALSO, has freq postcoital bleeding that is bothersome to patient, and also has dyspareunia/   PMHx: Past Medical History:  Diagnosis Date  . ADD (attention deficit disorder)   . Anemia   . Anxiety   . Asthma   . Bipolar affective disorder, manic ([place])   . Irregular menses    Past Surgical History:  Procedure Laterality Date  . TUBAL LIGATION  12/24/2003   Family History  Problem Relation Age of Onset  . Hypertension Mother   . Endometriosis Sister   . Heart disease Maternal Grandmother   . Hypertension Maternal Grandmother   . Stroke Maternal Grandmother   . Lung cancer Maternal Grandfather    Social History  Substance Use Topics  . Smoking status: Current Every Day Smoker    Types: E-cigarettes  . Smokeless tobacco: Never Used  . Alcohol use No   No outpatient prescriptions have been marked as  taking for the 07/17/16 encounter (Office Visit) with Gae Dry, MD.   Allergies: Omnipaque [iohexol]  Review of Systems  Constitutional: Negative for chills, fever and malaise/fatigue.  HENT: Negative for congestion, sinus pain and sore throat.   Eyes: Negative for blurred vision and pain.  Respiratory: Negative for cough and wheezing.   Cardiovascular: Negative for chest pain and leg swelling.  Gastrointestinal: Negative for abdominal pain, constipation, diarrhea, heartburn, nausea and vomiting.  Genitourinary: Negative for dysuria, frequency, hematuria and urgency.  Musculoskeletal: Negative for back pain, joint pain, myalgias and neck pain.  Skin: Negative for itching and rash.  Neurological: Negative for dizziness, tremors and weakness.  Endo/Heme/Allergies: Does not bruise/bleed easily.  Psychiatric/Behavioral: Negative for depression. The patient is not nervous/anxious and does not have insomnia.     Objective: BP 110/70   Pulse 76   Ht 5\' 6"  (1.676 m)   Wt 138 lb (62.6 kg)   BMI 22.27 kg/m   Filed Weights   07/17/16 1614  Weight: 138 lb (62.6 kg)   Physical Exam  Constitutional: She is oriented to person, place, and time. She appears well-developed and well-nourished. No distress.  Genitourinary: Rectum normal, vagina normal and uterus normal. Pelvic exam was performed with patient supine. There is no rash or lesion on the right labia. There is no rash or lesion on the left labia. Vagina exhibits no lesion. No bleeding in the vagina. Right adnexum does not display mass and does not display tenderness. Left adnexum does not display mass and does not display tenderness. Cervix does not  exhibit motion tenderness, lesion, friability or polyp.   Uterus is mobile and midaxial. Uterus is not enlarged or exhibiting a mass.  HENT:  Head: Normocephalic and atraumatic. Head is without laceration.  Right Ear: Hearing normal.  Left Ear: Hearing normal.  Nose: No epistaxis.  No  foreign bodies.  Mouth/Throat: Uvula is midline, oropharynx is clear and moist and mucous membranes are normal.  Eyes: Pupils are equal, round, and reactive to light.  Neck: Normal range of motion. Neck supple. No thyromegaly present.  Cardiovascular: Normal rate and regular rhythm.  Exam reveals no gallop and no friction rub.   No murmur heard. Pulmonary/Chest: Effort normal and breath sounds normal. No respiratory distress. She has no wheezes. Right breast exhibits no mass, no skin change and no tenderness. Left breast exhibits no mass, no skin change and no tenderness.  Abdominal: Soft. Bowel sounds are normal. She exhibits no distension. There is no tenderness. There is no rebound.  Musculoskeletal: Normal range of motion.  Neurological: She is alert and oriented to person, place, and time. No cranial nerve deficit.  Skin: Skin is warm and dry.  Psychiatric: She has a normal mood and affect. Judgment normal.  Vitals reviewed.   Assessment: 1. Menorrhagia with irregular cycle   2. Dyspareunia due to medical condition in female   3. PCB (post coital bleeding)     I have had a careful discussion with this patient about all the options available and the risk/benefits of each. I have fully informed this patient that surgery may subject her to a variety of discomforts and risks: She understands that most patients have surgery with little difficulty, but problems can happen ranging from minor to fatal. These include nausea, vomiting, pain, bleeding, infection, poor healing, hernia, or formation of adhesions. Unexpected reactions may occur from any drug or anesthetic given. Unintended injury may occur to other pelvic or abdominal structures such as Fallopian tubes, ovaries, bladder, ureter (tube from kidney to bladder), or bowel. Nerves going from the pelvis to the legs may be injured. Any such injury may require immediate or later additional surgery to correct the problem. Excessive blood loss  requiring transfusion is very unlikely but possible. Dangerous blood clots may form in the legs or lungs. Physical and sexual activity will be restricted in varying degrees for an indeterminate period of time but most often 2-6 weeks.  Finally, she understands that it is impossible to list every possible undesirable effect and that the condition for which surgery is done is not always cured or significantly improved, and in rare cases may be even worse.Ample time was given to answer all questions.  Barnett Applebaum, MD, [facility], [facility] 07/17/2016  4:56 PM

## 2016-07-18 ENCOUNTER — Encounter
Admission: RE | Admit: 2016-07-18 | Discharge: 2016-07-18 | Disposition: A | Discharge status: Home / Self Care | Payer: BLUE CROSS/BLUE SHIELD | Source: Ambulatory Visit | Attending: Obstetrics & Gynecology | Admitting: Obstetrics & Gynecology

## 2016-07-18 DIAGNOSIS — N930 Postcoital and contact bleeding: Secondary | ICD-10-CM | POA: Insufficient documentation

## 2016-07-18 DIAGNOSIS — Z01812 Encounter for preprocedural laboratory examination: Secondary | ICD-10-CM | POA: Insufficient documentation

## 2016-07-18 DIAGNOSIS — N921 Excessive and frequent menstruation with irregular cycle: Secondary | ICD-10-CM | POA: Insufficient documentation

## 2016-07-18 DIAGNOSIS — N9419 Other specified dyspareunia: Secondary | ICD-10-CM | POA: Insufficient documentation

## 2016-07-18 HISTORY — DX: Headache: R51

## 2016-07-18 HISTORY — DX: Personal history of urinary calculi: Z87.442

## 2016-07-18 HISTORY — DX: Headache, unspecified: R51.9

## 2016-07-18 NOTE — Patient Instructions (Signed)
  Your procedure is scheduled on: 07-24-16 TUESDAY Report to Same Day Surgery 2nd floor medical mall [facility] Entrance-take elevator on left to 2nd floor.  Check in with surgery information desk.) To find out your arrival time please call [phone] between 1PM - 3PM on 07-20-16 FRIDAY  Remember: Instructions that are not followed completely may result in serious medical risk, up to and including death, or upon the discretion of your surgeon and anesthesiologist your surgery may need to be rescheduled.    _x___ 1. Do not eat food or drink liquids after midnight. No gum chewing or hard candies.     __x__ 2. No Alcohol for 24 hours before or after surgery.   __x__3. No Smoking for 24 prior to surgery.   ____  4. Bring all medications with you on the day of surgery if instructed.    __x__ 5. Notify your doctor if there is any change in your medical condition     (cold, fever, infections).     Do not wear jewelry, make-up, hairpins, clips or nail polish.  Do not wear lotions, powders, or perfumes. You may wear deodorant.  Do not shave 48 hours prior to surgery. Men may shave face and neck.  Do not bring valuables to the hospital.    [facility] is not responsible for any belongings or valuables.               Contacts, dentures or bridgework may not be worn into surgery.  Leave your suitcase in the car. After surgery it may be brought to your room.  For patients admitted to the hospital, discharge time is determined by your  treatment team.   Patients discharged the day of surgery will not be allowed to drive home.  You will need someone to drive you home and stay with you the night of your procedure.    Please read over the following fact sheets that you were given:     _x___ [facility] WITH A SMALL SIP OF WATER. These include:  1. CELEXA (CITALOPRAM)  2.  3.  4.  5.  6.  ____Fleets enema or Magnesium Citrate as directed.   _x___  Use CHG Soap or sage wipes as directed on instruction sheet   _X___ Use inhalers on the day of surgery and bring to hospital day of surgery-USE NEBULIZER AND BREO ELLIPTA AT [facility]  ____ Stop Metformin and Janumet 2 days prior to surgery.    ____ Take 1/2 of usual insulin dose the night before surgery and none on the morning surgery.   ____ Follow recommendations from Cardiologist, Pulmonologist or PCP regarding stopping Aspirin, Coumadin, Pllavix ,Eliquis, Effient, or Pradaxa, and Pletal.  X____Stop Anti-inflammatories such as Advil, Aleve, Ibuprofen, Motrin, Naproxen, Naprosyn, Goodies powders or aspirin products NOW-OK to take Tylenol    ____ Stop supplements until after surgery   ____ Bring C-Pap to the hospital.

## 2016-07-19 ENCOUNTER — Encounter
Admission: RE | Admit: 2016-07-19 | Discharge: 2016-07-19 | Disposition: A | Discharge status: Home / Self Care | Payer: BLUE CROSS/BLUE SHIELD | Source: Ambulatory Visit | Attending: Obstetrics & Gynecology | Admitting: Obstetrics & Gynecology

## 2016-07-19 DIAGNOSIS — N930 Postcoital and contact bleeding: Secondary | ICD-10-CM | POA: Diagnosis not present

## 2016-07-19 DIAGNOSIS — N921 Excessive and frequent menstruation with irregular cycle: Secondary | ICD-10-CM | POA: Diagnosis present

## 2016-07-19 DIAGNOSIS — N9419 Other specified dyspareunia: Secondary | ICD-10-CM | POA: Diagnosis not present

## 2016-07-19 DIAGNOSIS — Z01812 Encounter for preprocedural laboratory examination: Secondary | ICD-10-CM | POA: Diagnosis not present

## 2016-07-19 LAB — TYPE AND SCREEN
ABO/RH(D): O POS
ANTIBODY SCREEN: NEGATIVE

## 2016-07-19 LAB — CBC
HCT: 35.5 % (ref 35.0–47.0)
Hemoglobin: 12 g/dL (ref 12.0–16.0)
MCH: 29.9 pg (ref 26.0–34.0)
MCHC: 33.9 g/dL (ref 32.0–36.0)
MCV: 88.3 fL (ref 80.0–100.0)
PLATELETS: 282 10*3/uL (ref 150–440)
RBC: 4.02 MIL/uL (ref 3.80–5.20)
RDW: 13.3 % (ref 11.5–14.5)
WBC: 6.4 10*3/uL (ref 3.6–11.0)

## 2016-07-23 MED ORDER — CEFOXITIN SODIUM-DEXTROSE 2-2.2 GM-% IV SOLR (PREMIX)
2.0000 g | Freq: Once | INTRAVENOUS | Status: AC
  Administered 2016-07-24: 2 g via INTRAVENOUS

## 2016-07-24 ENCOUNTER — Encounter
Admission: RE | Disposition: A | Discharge status: Home / Self Care | Payer: Self-pay | Source: Ambulatory Visit | Attending: Obstetrics & Gynecology

## 2016-07-24 ENCOUNTER — Ambulatory Visit: Payer: BLUE CROSS/BLUE SHIELD | Admitting: Certified Registered"

## 2016-07-24 ENCOUNTER — Ambulatory Visit
Admission: RE | Admit: 2016-07-24 | Discharge: 2016-07-24 | Disposition: A | Discharge status: Home / Self Care | Payer: BLUE CROSS/BLUE SHIELD | Source: Ambulatory Visit | Attending: Obstetrics & Gynecology | Admitting: Obstetrics & Gynecology

## 2016-07-24 ENCOUNTER — Telehealth: Payer: Self-pay

## 2016-07-24 DIAGNOSIS — Z79899 Other long term (current) drug therapy: Secondary | ICD-10-CM | POA: Insufficient documentation

## 2016-07-24 DIAGNOSIS — R102 Pelvic and perineal pain unspecified side: Secondary | ICD-10-CM | POA: Diagnosis present

## 2016-07-24 DIAGNOSIS — J45909 Unspecified asthma, uncomplicated: Secondary | ICD-10-CM | POA: Diagnosis not present

## 2016-07-24 DIAGNOSIS — N802 Endometriosis of fallopian tube: Secondary | ICD-10-CM | POA: Diagnosis not present

## 2016-07-24 DIAGNOSIS — N801 Endometriosis of ovary: Secondary | ICD-10-CM | POA: Diagnosis not present

## 2016-07-24 DIAGNOSIS — N941 Unspecified dyspareunia: Secondary | ICD-10-CM | POA: Insufficient documentation

## 2016-07-24 DIAGNOSIS — N809 Endometriosis, unspecified: Secondary | ICD-10-CM | POA: Diagnosis present

## 2016-07-24 DIAGNOSIS — Z7951 Long term (current) use of inhaled steroids: Secondary | ICD-10-CM | POA: Insufficient documentation

## 2016-07-24 DIAGNOSIS — N920 Excessive and frequent menstruation with regular cycle: Secondary | ICD-10-CM | POA: Diagnosis present

## 2016-07-24 DIAGNOSIS — N72 Inflammatory disease of cervix uteri: Secondary | ICD-10-CM | POA: Insufficient documentation

## 2016-07-24 DIAGNOSIS — N930 Postcoital and contact bleeding: Secondary | ICD-10-CM | POA: Insufficient documentation

## 2016-07-24 DIAGNOSIS — F17290 Nicotine dependence, other tobacco product, uncomplicated: Secondary | ICD-10-CM | POA: Diagnosis not present

## 2016-07-24 HISTORY — PX: CYSTOSCOPY: SHX5120

## 2016-07-24 HISTORY — PX: LAPAROSCOPIC HYSTERECTOMY: SHX1926

## 2016-07-24 HISTORY — PX: LAPAROSCOPIC BILATERAL SALPINGECTOMY: SHX5889

## 2016-07-24 LAB — POCT PREGNANCY, URINE: Preg Test, Ur: NEGATIVE

## 2016-07-24 LAB — ABO/RH: ABO/RH(D): O POS

## 2016-07-24 SURGERY — HYSTERECTOMY, TOTAL, LAPAROSCOPIC
Anesthesia: General | Wound class: Clean Contaminated

## 2016-07-24 MED ORDER — ONDANSETRON HCL 4 MG/2ML IJ SOLN
INTRAMUSCULAR | Status: DC | PRN
  Administered 2016-07-24: 4 mg via INTRAVENOUS

## 2016-07-24 MED ORDER — MIDAZOLAM HCL 2 MG/2ML IJ SOLN
INTRAMUSCULAR | Status: DC | PRN
  Administered 2016-07-24: 2 mg via INTRAVENOUS

## 2016-07-24 MED ORDER — ROCURONIUM BROMIDE 100 MG/10ML IV SOLN
INTRAVENOUS | Status: DC | PRN
  Administered 2016-07-24: 20 mg via INTRAVENOUS
  Administered 2016-07-24: 10 mg via INTRAVENOUS
  Administered 2016-07-24: 45 mg via INTRAVENOUS
  Administered 2016-07-24: 5 mg via INTRAVENOUS

## 2016-07-24 MED ORDER — SUCCINYLCHOLINE CHLORIDE 20 MG/ML IJ SOLN
INTRAMUSCULAR | Status: DC | PRN
Start: 2016-07-24 — End: 2016-07-24
  Administered 2016-07-24: 100 mg via INTRAVENOUS

## 2016-07-24 MED ORDER — FAMOTIDINE 20 MG PO TABS
20.0000 mg | ORAL_TABLET | Freq: Once | ORAL | Status: AC
  Administered 2016-07-24: 20 mg via ORAL

## 2016-07-24 MED ORDER — FENTANYL CITRATE (PF) 100 MCG/2ML IJ SOLN
INTRAMUSCULAR | Status: AC
  Administered 2016-07-24: 25 ug via INTRAVENOUS
  Filled 2016-07-24: qty 2

## 2016-07-24 MED ORDER — MIDAZOLAM HCL 2 MG/2ML IJ SOLN
INTRAMUSCULAR | Status: AC
  Filled 2016-07-24: qty 2

## 2016-07-24 MED ORDER — ROCURONIUM BROMIDE 50 MG/5ML IV SOLN
INTRAVENOUS | Status: AC
  Filled 2016-07-24: qty 1

## 2016-07-24 MED ORDER — PROPOFOL 10 MG/ML IV BOLUS
INTRAVENOUS | Status: DC | PRN
  Administered 2016-07-24: 160 mg via INTRAVENOUS

## 2016-07-24 MED ORDER — PROPOFOL 10 MG/ML IV BOLUS
INTRAVENOUS | Status: AC
  Filled 2016-07-24: qty 20

## 2016-07-24 MED ORDER — ALBUTEROL SULFATE HFA 108 (90 BASE) MCG/ACT IN AERS
INHALATION_SPRAY | RESPIRATORY_TRACT | Status: AC
Start: 2016-07-24 — End: 2016-07-24
  Filled 2016-07-24: qty 6.7

## 2016-07-24 MED ORDER — DEXAMETHASONE SODIUM PHOSPHATE 10 MG/ML IJ SOLN
INTRAMUSCULAR | Status: DC | PRN
  Administered 2016-07-24: 4 mg via INTRAVENOUS

## 2016-07-24 MED ORDER — DEXAMETHASONE SODIUM PHOSPHATE 10 MG/ML IJ SOLN
INTRAMUSCULAR | Status: AC
  Filled 2016-07-24: qty 1

## 2016-07-24 MED ORDER — SUGAMMADEX SODIUM 200 MG/2ML IV SOLN
INTRAVENOUS | Status: AC
  Filled 2016-07-24: qty 2

## 2016-07-24 MED ORDER — OXYCODONE-ACETAMINOPHEN 5-325 MG PO TABS: 1.0000 | ORAL_TABLET | ORAL | 0 refills | Status: DC | PRN

## 2016-07-24 MED ORDER — ONDANSETRON HCL 4 MG/2ML IJ SOLN: 4.0000 mg | Freq: Once | INTRAMUSCULAR | Status: DC | PRN

## 2016-07-24 MED ORDER — FENTANYL CITRATE (PF) 100 MCG/2ML IJ SOLN
25.0000 ug | INTRAMUSCULAR | Status: AC | PRN
  Administered 2016-07-24 (×6): 25 ug via INTRAVENOUS

## 2016-07-24 MED ORDER — SUCCINYLCHOLINE CHLORIDE 20 MG/ML IJ SOLN
INTRAMUSCULAR | Status: AC
  Filled 2016-07-24: qty 1

## 2016-07-24 MED ORDER — PHENYLEPHRINE HCL 10 MG/ML IJ SOLN
INTRAMUSCULAR | Status: DC | PRN
  Administered 2016-07-24 (×2): 100 ug via INTRAVENOUS

## 2016-07-24 MED ORDER — FENTANYL CITRATE (PF) 100 MCG/2ML IJ SOLN
INTRAMUSCULAR | Status: DC | PRN
  Administered 2016-07-24: 50 ug via INTRAVENOUS
  Administered 2016-07-24: 150 ug via INTRAVENOUS

## 2016-07-24 MED ORDER — SUGAMMADEX SODIUM 200 MG/2ML IV SOLN
INTRAVENOUS | Status: DC | PRN
  Administered 2016-07-24: 130 mg via INTRAVENOUS

## 2016-07-24 MED ORDER — PHENYLEPHRINE HCL 10 MG/ML IJ SOLN
INTRAMUSCULAR | Status: AC
  Filled 2016-07-24: qty 1

## 2016-07-24 MED ORDER — FAMOTIDINE 20 MG PO TABS
ORAL_TABLET | ORAL | Status: AC
  Administered 2016-07-24: 20 mg via ORAL
  Filled 2016-07-24: qty 1

## 2016-07-24 MED ORDER — ALBUTEROL SULFATE HFA 108 (90 BASE) MCG/ACT IN AERS
INHALATION_SPRAY | RESPIRATORY_TRACT | Status: DC | PRN
  Administered 2016-07-24: 4 via RESPIRATORY_TRACT

## 2016-07-24 MED ORDER — ONDANSETRON HCL 4 MG/2ML IJ SOLN
INTRAMUSCULAR | Status: AC
  Filled 2016-07-24: qty 2

## 2016-07-24 MED ORDER — ACETAMINOPHEN 10 MG/ML IV SOLN
INTRAVENOUS | Status: DC | PRN
Start: 2016-07-24 — End: 2016-07-24
  Administered 2016-07-24: 1000 mg via INTRAVENOUS

## 2016-07-24 MED ORDER — BUPIVACAINE HCL (PF) 0.5 % IJ SOLN
INTRAMUSCULAR | Status: DC | PRN
  Administered 2016-07-24: 11 mL

## 2016-07-24 MED ORDER — BUPIVACAINE HCL (PF) 0.5 % IJ SOLN
INTRAMUSCULAR | Status: AC
  Filled 2016-07-24: qty 30

## 2016-07-24 MED ORDER — FENTANYL CITRATE (PF) 250 MCG/5ML IJ SOLN
INTRAMUSCULAR | Status: AC
  Filled 2016-07-24: qty 5

## 2016-07-24 MED ORDER — ACETAMINOPHEN NICU IV SYRINGE 10 MG/ML
INTRAVENOUS | Status: AC
  Filled 2016-07-24: qty 1

## 2016-07-24 MED ORDER — LACTATED RINGERS IV SOLN
INTRAVENOUS | Status: DC
  Administered 2016-07-24: 07:00:00 via INTRAVENOUS

## 2016-07-24 MED ORDER — CEFOXITIN SODIUM-DEXTROSE 2-2.2 GM-% IV SOLR (PREMIX)
INTRAVENOUS | Status: AC
  Filled 2016-07-24: qty 50

## 2016-07-24 SURGICAL SUPPLY — 56 items
BAG URO DRAIN 2000ML W/SPOUT (MISCELLANEOUS) ×4 IMPLANT
BLADE SURG SZ11 CARB STEEL (BLADE) ×4 IMPLANT
CANISTER SUCT 1200ML W/VALVE (MISCELLANEOUS) ×4 IMPLANT
CATH FOLEY 2WAY  5CC 16FR (CATHETERS) ×1
CATH ROBINSON RED A/P 16FR (CATHETERS) ×4 IMPLANT
CATH URTH 16FR FL 2W BLN LF (CATHETERS) ×3 IMPLANT
CHLORAPREP W/TINT 26ML (MISCELLANEOUS) ×4 IMPLANT
DEFOGGER SCOPE WARMER CLEARIFY (MISCELLANEOUS) ×4 IMPLANT
DERMABOND ADVANCED (GAUZE/BANDAGES/DRESSINGS) ×1
DERMABOND ADVANCED .7 DNX12 (GAUZE/BANDAGES/DRESSINGS) ×3 IMPLANT
DEVICE SUTURE ENDOST 10MM (ENDOMECHANICALS) ×4 IMPLANT
DRAPE CAMERA CLOSED 9X96 (DRAPES) IMPLANT
DRSG TEGADERM 2-3/8X2-3/4 SM (GAUZE/BANDAGES/DRESSINGS) IMPLANT
DRSG TELFA 4X3 1S NADH ST (GAUZE/BANDAGES/DRESSINGS) ×4 IMPLANT
ENDOPOUCH RETRIEVER 10 (MISCELLANEOUS) IMPLANT
ENDOSTITCH 0 SINGLE 48 (SUTURE) ×20 IMPLANT
GAUZE SPONGE NON-WVN 2X2 STRL (MISCELLANEOUS) IMPLANT
GLOVE BIO SURGEON STRL SZ8 (GLOVE) ×16 IMPLANT
GLOVE INDICATOR 8.0 STRL GRN (GLOVE) ×16 IMPLANT
GOWN STRL REUS W/ TWL LRG LVL3 (GOWN DISPOSABLE) ×9 IMPLANT
GOWN STRL REUS W/ TWL XL LVL3 (GOWN DISPOSABLE) ×3 IMPLANT
GOWN STRL REUS W/TWL LRG LVL3 (GOWN DISPOSABLE) ×3
GOWN STRL REUS W/TWL XL LVL3 (GOWN DISPOSABLE) ×1
GRASPER SUT TROCAR 14GX15 (MISCELLANEOUS) IMPLANT
IRRIGATION STRYKERFLOW (MISCELLANEOUS) ×3 IMPLANT
IRRIGATOR STRYKERFLOW (MISCELLANEOUS) ×4
IV LACTATED RINGERS 1000ML (IV SOLUTION) ×4 IMPLANT
KIT PINK PAD W/HEAD ARE REST (MISCELLANEOUS) ×4
KIT PINK PAD W/HEAD ARM REST (MISCELLANEOUS) ×3 IMPLANT
KIT RM TURNOVER CYSTO AR (KITS) ×4 IMPLANT
LABEL OR SOLS (LABEL) ×4 IMPLANT
MANIPULATOR VCARE LG CRV RETR (MISCELLANEOUS) ×4 IMPLANT
MANIPULATOR VCARE SML CRV RETR (MISCELLANEOUS) IMPLANT
MANIPULATOR VCARE STD CRV RETR (MISCELLANEOUS) IMPLANT
NEEDLE VERESS 14GA 120MM (NEEDLE) ×4 IMPLANT
NS IRRIG 500ML POUR BTL (IV SOLUTION) ×4 IMPLANT
OCCLUDER COLPOPNEUMO (BALLOONS) ×4 IMPLANT
PACK GYN LAPAROSCOPIC (MISCELLANEOUS) ×4 IMPLANT
PAD OB MATERNITY 4.3X12.25 (PERSONAL CARE ITEMS) ×4 IMPLANT
PAD PREP 24X41 OB/GYN DISP (PERSONAL CARE ITEMS) ×4 IMPLANT
SCISSORS METZENBAUM CVD 33 (INSTRUMENTS) ×4 IMPLANT
SET CYSTO W/LG BORE CLAMP LF (SET/KITS/TRAYS/PACK) ×4 IMPLANT
SHEARS HARMONIC ACE PLUS 36CM (ENDOMECHANICALS) ×4 IMPLANT
SLEEVE ENDOPATH XCEL 5M (ENDOMECHANICALS) ×4 IMPLANT
SPONGE LAP 18X18 5 PK (GAUZE/BANDAGES/DRESSINGS) IMPLANT
SPONGE VERSALON 2X2 STRL (MISCELLANEOUS)
STRAP SAFETY BODY (MISCELLANEOUS) IMPLANT
SUT VIC AB 0 CT1 36 (SUTURE) ×4 IMPLANT
SUT VIC AB 2-0 UR6 27 (SUTURE) IMPLANT
SUT VIC AB 4-0 PS2 18 (SUTURE) IMPLANT
SYR 50ML LL SCALE MARK (SYRINGE) ×4 IMPLANT
SYRINGE 10CC LL (SYRINGE) ×4 IMPLANT
TROCAR ENDO BLADELESS 11MM (ENDOMECHANICALS) ×4 IMPLANT
TROCAR XCEL NON-BLD 5MMX100MML (ENDOMECHANICALS) ×4 IMPLANT
TUBING INSUF HEATED (TUBING) IMPLANT
TUBING INSUFFLATOR HI FLOW (MISCELLANEOUS) ×4 IMPLANT

## 2016-07-24 NOTE — Op Note (Signed)
Operative Report:  PRE-OP DIAGNOSIS: MENORRHAGIA,PELVIC PAIN,POST COITAL BLEEDING   POST-OP DIAGNOSIS: MENORRHAGIA,PELVIC PAIN,POST COITAL BLEEDING, also ENDOMETRIOSIS  PROCEDURE: Procedure(s): HYSTERECTOMY TOTAL LAPAROSCOPIC LAPAROSCOPIC BILATERAL SALPINGECTOMY CYSTOSCOPY  SURGEON: Barnett Applebaum, MD, FACOG  ASSISTANT: Glennon Mac   ANESTHESIA: General endotracheal anesthesia  ESTIMATED BLOOD LOSS: 10 mL  SPECIMENS: Uterus, Tubes.  COMPLICATIONS: None  DISPOSITION: stable to PACU  FINDINGS: Intraabdominal adhesions were not noted. Endometriosis in bilateral ovarian fossa  PROCEDURE:  The patient was taken to the OR where anesthesia was administed. She was prepped and draped in the normal sterile fashion in the dorsal lithotomy position in the Plessis stirrups. A time out was performed. A Graves speculum was inserted, the cervix was grasped with a single tooth tenaculum and the endometrial cavity was sounded. The cervix was progressively dilated to a size 18 [place] with [employer] dilators. A V-Care uterine manipulator was inserted in the usual fashion without incident. Gloves were changed and attention was turned to the abdomen.   An infraumbilical transverse 65mm skin incision was made with the scalpel after local anesthesia applied to the skin. A Veress-step needle was inserted in the usual fashion and confirmed using the hanging drop technique. A pneumoperitoneum was obtained by insufflation of CO2 (opening pressure of 59mmHg) to 64mmHg. A diagnostic laparoscopy was performed yielding the previously described findings. Attention was turned to the left lower quadrant where after visualization of the inferior epigastric vessels a 34mm skin incision was made with the scalpel. A 5 mm laparoscopic port was inserted. The same procedure was repeated in the right lower quadrant with a 78mm trocar. Attention was turned to the left aspect of the uterus, where after visualization of the ureter, the round  ligament was coagulated and transected using the 20mm Harmonic Scapel. The anterior and posterior leafs of the broad ligament were dissected off as the anterior one was coagulated and transected in a caudal direction towards the cuff of the uterine manipulator.  Attention was then turned to the left fallopian tube which was recognized by visualization of the fimbria. The tube is excised to its attachment to the uterus. The uterine-ovarian ligament and its blood vessels were carefully coagulated and transected using the Harmonic scapel.  Attention was turned to the right aspect of the uterus where the same procedure was performed.  The vesicouterine reflection of the peritoneum was dissected with the harmonic scapel and the bladder flap was created bluntly.  The uterine vessels were coagulated and transected bilaterally using first bipolar cautery and then the harmonic scapel. A 360 degree, circumferential colpotomy was done to completely amputate the uterus with cervix and tubes. Once the specimen was amputated it was delivered through the vagina.   The colpotomy was repaired in a simple interrupted fashion using a 0-Polysorb suture with an endo-stitch device.  Vaginal exam confirms complete closure.  The cavity was copiously irrigated. A survey of the pelvic cavity revealed adequate hemostasis and no injury to bowel, bladder, or ureter.   A diagnostic cystoscopy was performed using saline distension of bladder with no lesions or injuries noted.  Bilateral urine flow from each ureteral orifice is visualized.  At this point the procedure was finalized. All the instruments were removed from the patient's body. Gas was expelled and patient is leveled.  Incisions are closed with skin adhesive.    Patient goes to recovery room in stable condition.  All sponge, instrument, and needle counts are correct x2.     Barnett Applebaum, MD, [facility], [facility] 07/24/2016  9:26 AM

## 2016-07-24 NOTE — Telephone Encounter (Signed)
FMLA/DISABILITY form for [employer] filled out and given to [place] for processing.

## 2016-07-24 NOTE — Anesthesia Procedure Notes (Signed)
Procedure Name: Intubation Performed by: Lexx Monte Pre-anesthesia Checklist: Patient identified, Patient being monitored, Timeout performed, Emergency Drugs available and Suction available Patient Re-evaluated:Patient Re-evaluated prior to inductionOxygen Delivery Method: Circle system utilized Preoxygenation: Pre-oxygenation with 100% oxygen Intubation Type: IV induction Ventilation: Mask ventilation without difficulty Laryngoscope Size: Mac and 3 Grade View: Grade I Tube type: Oral Tube size: 7.0 mm Number of attempts: 1 Airway Equipment and Method: Stylet Placement Confirmation: ETT inserted through vocal cords under direct vision,  positive ETCO2 and breath sounds checked- equal and bilateral Secured at: 21 cm Tube secured with: Tape Dental Injury: Teeth and Oropharynx as per pre-operative assessment        

## 2016-07-24 NOTE — H&P (Signed)
History and Physical Interval Note:  07/24/2016 7:15 AM  Sheila Benton  has presented today for surgery, with the diagnosis of MENORRHAGIA,PELVIC PAIN,POST COITAL BLEEDING  The various methods of treatment have been discussed with the patient and family. After consideration of risks, benefits and other options for treatment, the patient has consented to  Procedure(s): HYSTERECTOMY TOTAL LAPAROSCOPIC (N/A) LAPAROSCOPIC BILATERAL SALPINGECTOMY (Bilateral) as a surgical intervention .  The patient's history has been reviewed, patient examined, no change in status, stable for surgery.  Pt has the following beta blocker history-  Not taking Beta Blocker0.  I have reviewed the patient's chart and labs.  Questions were answered to the patient's satisfaction.    Hoyt Koch

## 2016-07-24 NOTE — Anesthesia Preprocedure Evaluation (Signed)
Anesthesia Evaluation  Patient identified by MRN, date of birth, ID band Patient awake    Reviewed: Allergy & Precautions, H&P , NPO status , Patient's Chart, lab work & pertinent test results, reviewed documented beta blocker date and time   Airway Mallampati: II  TM Distance: >3 FB Neck ROM: full    Dental  (+) Teeth Intact, Poor Dentition, Chipped   Pulmonary neg pulmonary ROS, neg shortness of breath, asthma , Current Smoker,    Pulmonary exam normal        Cardiovascular Exercise Tolerance: Good negative cardio ROS Normal cardiovascular exam Rhythm:regular Rate:Normal     Neuro/Psych  Headaches, PSYCHIATRIC DISORDERS Anxiety Bipolar Disorder negative neurological ROS  negative psych ROS   GI/Hepatic negative GI ROS, Neg liver ROS,   Endo/Other  negative endocrine ROS  Renal/GU negative Renal ROS  negative genitourinary   Musculoskeletal   Abdominal   Peds  Hematology negative hematology ROS (+) anemia ,   Anesthesia Other Findings Past Medical History: No date: ADD (attention deficit disorder) No date: Anemia No date: Anxiety No date: Asthma     Comment: WELL CONTROLLED No date: Bipolar affective disorder, manic ([place]) No date: Headache     Comment: MIGRAINES No date: History of kidney stones No date: Irregular menses Past Surgical History: 12/24/2003: TUBAL LIGATION BMI    Body Mass Index:  22.27 kg/m     Reproductive/Obstetrics negative OB ROS                             Anesthesia Physical Anesthesia Plan  ASA: II  Anesthesia Plan: General ETT   Post-op Pain Management:    Induction:   Airway Management Planned:   Additional Equipment:   Intra-op Plan:   Post-operative Plan:   Informed Consent: I have reviewed the patients History and Physical, chart, labs and discussed the procedure including the risks, benefits and alternatives for the proposed anesthesia  with the patient or authorized representative who has indicated his/her understanding and acceptance.   Dental Advisory Given  Plan Discussed with: CRNA  Anesthesia Plan Comments:         Anesthesia Quick Evaluation

## 2016-07-24 NOTE — Transfer of Care (Signed)
Immediate Anesthesia Transfer of Care Note  Patient: Sheila Benton  Procedure(s) Performed: Procedure(s): HYSTERECTOMY TOTAL LAPAROSCOPIC (N/A) LAPAROSCOPIC BILATERAL SALPINGECTOMY (Bilateral) CYSTOSCOPY  Patient Location: PACU  Anesthesia Type:General  Level of Consciousness: sedated  Airway & Oxygen Therapy: Patient Spontanous Breathing and Patient connected to face mask oxygen  Post-op Assessment: Report given to RN and Post -op Vital signs reviewed and stable  Post vital signs: Reviewed and stable  Last Vitals:  Vitals:   07/24/16 0718 07/24/16 0932  BP:  (!) 95/57  Pulse:  86  Resp:  17  Temp: 36.8 C     Last Pain:  Vitals:   07/24/16 0718  TempSrc: Tympanic         Complications: No apparent anesthesia complications

## 2016-07-24 NOTE — Anesthesia Postprocedure Evaluation (Signed)
Anesthesia Post Note  Patient: Sheila Benton  Procedure(s) Performed: Procedure(s) (LRB): HYSTERECTOMY TOTAL LAPAROSCOPIC (N/A) LAPAROSCOPIC BILATERAL SALPINGECTOMY (Bilateral) CYSTOSCOPY  Patient location during evaluation: PACU Anesthesia Type: General Level of consciousness: awake and alert Pain management: pain level controlled Vital Signs Assessment: post-procedure vital signs reviewed and stable Respiratory status: spontaneous breathing, nonlabored ventilation, respiratory function stable and patient connected to nasal cannula oxygen Cardiovascular status: blood pressure returned to baseline and stable Postop Assessment: no signs of nausea or vomiting Anesthetic complications: no     Last Vitals:  Vitals:   07/24/16 1059 07/24/16 1115  BP: 112/62 115/68  Pulse: 92 88  Resp: 18 16  Temp: 36.4 C 36.6 C    Last Pain:  Vitals:   07/24/16 1115  TempSrc:   PainSc: [place] Maurie Musco

## 2016-07-24 NOTE — Anesthesia Post-op Follow-up Note (Cosign Needed)
Anesthesia QCDR form completed.        

## 2016-07-24 NOTE — Discharge Instructions (Signed)
Total Laparoscopic Hysterectomy, Care After Refer to this sheet in the next few weeks. These instructions provide you with information on caring for yourself after your procedure. Your health care provider may also give you more specific instructions. Your treatment has been planned according to current medical practices, but problems sometimes occur. Call your health care provider if you have any problems or questions after your procedure. What can I expect after the procedure?  Pain and bruising at the incision sites. You will be given pain medicine to control it.  Menopausal symptoms such as hot flashes, night sweats, and insomnia if your ovaries were removed.  Sore throat from the breathing tube that was inserted during surgery. Follow these instructions at home:  Only take over-the-counter or prescription medicines for pain, discomfort, or fever as directed by your health care provider.  Do not take aspirin. It can cause bleeding.  Do not drive when taking pain medicine.  Follow your health care provider's advice regarding diet, exercise, lifting, driving, and general activities.  Resume your usual diet as directed and allowed.  Get plenty of rest and sleep.  Do not douche, use tampons, or have sexual intercourse for at least 6 weeks, or until your health care provider gives you permission.  REMOVE your bandages (dressings) Wednesday; keep incision clean and dry and uncovered unless you need bandaid for confort.  Monitor your temperature and notify your health care provider of a fever.  Take showers instead of baths for 2-3 weeks.  Do not drink alcohol until your health care provider gives you permission.  If you develop constipation, you may take a mild laxative with your health care provider's permission. Bran foods may help with constipation problems. Drinking enough fluids to keep your urine clear or pale yellow may help as well.  Try to have someone home with you for 1-2  weeks to help around the house.  Keep all of your follow-up appointments as directed by your health care provider. Contact a health care provider if:  You have swelling, redness, or increasing pain around your incision sites.  You have pus coming from your incision.  You notice a bad smell coming from your incision.  Your incision breaks open.  You feel dizzy or lightheaded.  You have pain or bleeding when you urinate.  You have persistent diarrhea.  You have persistent nausea and vomiting.  You have abnormal vaginal discharge.  You have a rash.  You have any type of abnormal reaction or develop an allergy to your medicine.  You have poor pain control with your prescribed medicine. Get help right away if:  You have chest pain or shortness of breath.  You have severe abdominal pain that is not relieved with pain medicine.  You have pain or swelling in your legs. This information is not intended to replace advice given to you by your health care provider. Make sure you discuss any questions you have with your health care provider. Document Released: 12/03/2012 Document Revised: 07/21/2015 Document Reviewed: 09/02/2012 Elsevier Interactive Patient Education  2017 [employer].

## 2016-07-25 ENCOUNTER — Encounter: Payer: Self-pay | Admitting: Obstetrics & Gynecology

## 2016-07-25 LAB — SURGICAL PATHOLOGY

## 2016-07-26 ENCOUNTER — Encounter: Payer: Self-pay | Admitting: Obstetrics & Gynecology

## 2016-07-26 ENCOUNTER — Telehealth: Payer: Self-pay

## 2016-07-26 NOTE — Telephone Encounter (Signed)
FMLA/DISABILITY second form filled out for [facility] and given to [place] for processing.

## 2016-08-07 ENCOUNTER — Other Ambulatory Visit: Payer: Self-pay | Admitting: Family Medicine

## 2016-08-07 ENCOUNTER — Encounter: Payer: Self-pay | Admitting: Obstetrics & Gynecology

## 2016-08-07 ENCOUNTER — Ambulatory Visit (INDEPENDENT_AMBULATORY_CARE_PROVIDER_SITE_OTHER): Payer: BLUE CROSS/BLUE SHIELD | Admitting: Obstetrics & Gynecology

## 2016-08-07 VITALS — BP 110/70 | HR 81 | Ht 66.0 in | Wt 135.0 lb

## 2016-08-07 DIAGNOSIS — N809 Endometriosis, unspecified: Secondary | ICD-10-CM

## 2016-08-07 NOTE — Telephone Encounter (Signed)
Routing to provider  

## 2016-08-07 NOTE — Progress Notes (Signed)
  Postoperative Follow-up Patient presents post op from [place], [place], Cysto for pelvic pain, 2 weeks ago.  Subjective: Patient reports some improvement in her preop symptoms. Eating a regular diet without difficulty. Pain is controlled with current analgesics. Medications being used: ibuprofen (OTC).  Activity: normal activities of daily living. Patient reports vaginal sx's of None  Objective: BP 110/70   Pulse 81   Ht 5\' 6"  (1.676 m)   Wt 135 lb (61.2 kg)   LMP 07/13/2016 (Exact Date)   BMI 21.79 kg/m  Physical Exam  Constitutional: She is oriented to person, place, and time. She appears well-developed and well-nourished. No distress.  Cardiovascular: Normal rate.   Pulmonary/Chest: Effort normal.  Abdominal: Soft. She exhibits no distension. There is no tenderness.  Incision Healing Well   Musculoskeletal: Normal range of motion.  Neurological: She is alert and oriented to person, place, and time. No cranial nerve deficit.  Skin: Skin is warm and dry.  Psychiatric: She has a normal mood and affect.    Assessment: s/p :  total laparoscopic hysterectomy with bilateral salpingectomy stable  Plan: Patient has done well after surgery with no apparent complications.  I have discussed the post-operative course to date, and the expected progress moving forward.  The patient understands what complications to be concerned about.  I will see the patient in routine follow up, or sooner if needed.    Activity plan: May return to work in 34 days  Hoyt Koch 08/07/2016, 2:46 PM

## 2016-08-10 ENCOUNTER — Encounter: Payer: Self-pay | Admitting: Obstetrics & Gynecology

## 2016-08-26 ENCOUNTER — Other Ambulatory Visit: Payer: Self-pay | Admitting: Family Medicine

## 2016-09-04 ENCOUNTER — Encounter: Payer: Self-pay | Admitting: Obstetrics & Gynecology

## 2016-09-04 NOTE — Telephone Encounter (Signed)
It has been 6 weeks since surgery. Not sure why incision area now looks inflammed.   Will check on it tomorrow.

## 2016-09-05 ENCOUNTER — Ambulatory Visit (INDEPENDENT_AMBULATORY_CARE_PROVIDER_SITE_OTHER): Payer: BLUE CROSS/BLUE SHIELD | Admitting: Obstetrics & Gynecology

## 2016-09-05 ENCOUNTER — Encounter: Payer: Self-pay | Admitting: Obstetrics & Gynecology

## 2016-09-05 VITALS — BP 120/80 | HR 81 | Ht 66.0 in | Wt 140.0 lb

## 2016-09-05 DIAGNOSIS — R102 Pelvic and perineal pain: Secondary | ICD-10-CM

## 2016-09-05 NOTE — Progress Notes (Signed)
  Postoperative Follow-up Patient presents post op from [facility] BS for pelvic pain, 6 weeks ago.  Subjective: Patient reports marked improvement in her preop symptoms. Eating a regular diet without difficulty. The patient is not having any pain.  Activity: normal activities of daily living. Patient reports vaginal sx's of Irregular bleeding  Objective: BP 120/80   Pulse 81   Ht 5\' 6"  (1.676 m)   Wt 140 lb (63.5 kg)   BMI 22.60 kg/m  Physical Exam  Constitutional: She is oriented to person, place, and time. She appears well-developed and well-nourished. No distress.  Genitourinary: Rectum normal and vagina normal. Pelvic exam was performed with patient supine. There is no rash, tenderness or lesion on the right labia. There is no rash, tenderness or lesion on the left labia. No erythema or bleeding in the vagina. Right adnexum does not display mass and does not display tenderness. Left adnexum does not display mass and does not display tenderness.  Genitourinary Comments: Cervix and uterus absent. Vaginal cuff with mild separation and granulation tissue present, contact bleeding light  Cardiovascular: Normal rate.   Pulmonary/Chest: Effort normal.  Abdominal: Soft. She exhibits no distension. There is no tenderness.  Incision healing well.  Musculoskeletal: Normal range of motion.  Neurological: She is alert and oriented to person, place, and time. No cranial nerve deficit.  Skin: Skin is warm and dry.  Psychiatric: She has a normal mood and affect.    Assessment: s/p :  total laparoscopic hysterectomy with bilateral salpingectomy stable  Plan: Patient has done well after surgery with no apparent complications.  I have discussed the post-operative course to date, and the expected progress moving forward.  The patient understands what complications to be concerned about.  I will see the patient in routine follow up, or sooner if needed.    Activity plan: No restriction except pelvic rest  still due to slow healing vaginal cuff.  Recheck 3 weeks.  Option for surgery to stitch cuff discussed.  Hoyt Koch 09/05/2016, 3:29 PM

## 2016-09-06 ENCOUNTER — Telehealth: Payer: Self-pay

## 2016-09-06 NOTE — Telephone Encounter (Signed)
FMLA/DISABILITY form for [employer] filled out and given to [place] for processsing.

## 2016-10-05 ENCOUNTER — Ambulatory Visit (INDEPENDENT_AMBULATORY_CARE_PROVIDER_SITE_OTHER): Payer: BLUE CROSS/BLUE SHIELD | Admitting: Obstetrics & Gynecology

## 2016-10-05 ENCOUNTER — Encounter: Payer: Self-pay | Admitting: Obstetrics & Gynecology

## 2016-10-05 VITALS — BP 118/82 | HR 80 | Wt 146.0 lb

## 2016-10-05 DIAGNOSIS — R635 Abnormal weight gain: Secondary | ICD-10-CM

## 2016-10-05 DIAGNOSIS — R140 Abdominal distension (gaseous): Secondary | ICD-10-CM | POA: Diagnosis not present

## 2016-10-05 NOTE — Progress Notes (Signed)
CC: weight gain and bloating  Patient presents with c/o 10-15 lb weight gain over past 2 mos and bloating in abd, some fluid retention in feet.  She had TLH BS (but preservation of ovaries)  for pain and abnormal uterine bleeding, 10 weeks ago ago.  Subjective: Patient reports marked improvement in her preop symptoms of pain. Eating a regular diet without difficulty. Sexually active w no bleeding or pain.  Activity: normal activities of daily living. Patient reports vaginal sx's of None  PMHx: She  has a past medical history of ADD (attention deficit disorder); Anemia; Anxiety; Asthma; Bipolar affective disorder, manic ([place]); Headache; History of kidney stones; and Irregular menses. Also,  has a past surgical history that includes Tubal ligation (12/24/2003); Laparoscopic hysterectomy (N/A, 07/24/2016); Laparoscopic bilateral salpingectomy (Bilateral, 07/24/2016); and Cystoscopy (07/24/2016)., family history includes Endometriosis in her sister; Heart disease in her maternal grandmother; Hypertension in her maternal grandmother and mother; Lung cancer in her maternal grandfather; Stroke in her maternal grandmother.,  reports that she has been smoking E-cigarettes.  She has never used smokeless tobacco. She reports that she does not drink alcohol or use drugs. No outpatient prescriptions have been marked as taking for the 10/05/16 encounter (Office Visit) with Gae Dry, MD.  . Also, is allergic to omnipaque [iohexol]..  Review of Systems  Constitutional: Negative for chills, fever and malaise/fatigue.  HENT: Negative for congestion, sinus pain and sore throat.   Eyes: Negative for blurred vision and pain.  Respiratory: Negative for cough and wheezing.   Cardiovascular: Negative for chest pain and leg swelling.  Gastrointestinal: Negative for abdominal pain, constipation, diarrhea, heartburn, nausea and vomiting.  Genitourinary: Negative for dysuria, frequency, hematuria and urgency.    Musculoskeletal: Negative for back pain, joint pain, myalgias and neck pain.  Skin: Negative for itching and rash.  Neurological: Negative for dizziness, tremors and weakness.  Endo/Heme/Allergies: Does not bruise/bleed easily.  Psychiatric/Behavioral: Negative for depression. The patient is not nervous/anxious and does not have insomnia.    Objective: BP 118/82   Pulse 80   Wt 146 lb (66.2 kg)   BMI 23.57 kg/m  Physical Exam  Constitutional: She is oriented to person, place, and time. She appears well-developed and well-nourished. No distress.  Genitourinary: Rectum normal and vagina normal. Pelvic exam was performed with patient supine. There is no rash or lesion on the right labia. There is no rash or lesion on the left labia. Vagina exhibits no lesion. No bleeding in the vagina. Right adnexum does not display mass and does not display tenderness. Left adnexum does not display mass and does not display tenderness.  Genitourinary Comments: Absent Uterus Absent cervix Vaginal cuff well healed  HENT:  Head: Normocephalic and atraumatic. Head is without laceration.  Right Ear: Hearing normal.  Left Ear: Hearing normal.  Nose: No epistaxis.  No foreign bodies.  Mouth/Throat: Uvula is midline, oropharynx is clear and moist and mucous membranes are normal.  Eyes: Pupils are equal, round, and reactive to light.  Neck: Normal range of motion. Neck supple. No thyromegaly present.  Cardiovascular: Normal rate and regular rhythm.  Exam reveals no gallop and no friction rub.   No murmur heard. Pulmonary/Chest: Effort normal and breath sounds normal. No respiratory distress. She has no wheezes. Right breast exhibits no mass, no skin change and no tenderness. Left breast exhibits no mass, no skin change and no tenderness.  Abdominal: Soft. Bowel sounds are normal. She exhibits no distension. There is no tenderness. There is no  rebound.  Musculoskeletal: Normal range of motion.  Neurological: She  is alert and oriented to person, place, and time. No cranial nerve deficit.  Skin: Skin is warm and dry.  Psychiatric: She has a normal mood and affect. Judgment normal.  Vitals reviewed.  Assessment: 1. Bloating, weight gain 2. s/p :  total laparoscopic hysterectomy with bilateral salpingectomy stable  Plan: Labs to monitor for abn cause for sx's.  May be due to inactivity after surgery and / or slight hormonal changes after hysterectomy despite preservation of her ovaries.  Monitor sx's over time, increase activity and exercise.  F/u on labs.  Patient has done well after surgery with no apparent complications.  I have discussed the post-operative course to date, and the expected progress moving forward.  The patient understands what complications to be concerned about.  I will see the patient in routine follow up, or sooner if needed.    Activity plan: No restriction.  Hoyt Koch 10/05/2016, 4:22 PM

## 2016-10-06 LAB — COMPREHENSIVE METABOLIC PANEL
A/G RATIO: 1.7 (ref 1.2–2.2)
ALT: 11 IU/L (ref 0–32)
AST: 15 IU/L (ref 0–40)
Albumin: 4 g/dL (ref 3.5–5.5)
Alkaline Phosphatase: 81 IU/L (ref 39–117)
BUN/Creatinine Ratio: 10 (ref 9–23)
BUN: 8 mg/dL (ref 6–20)
CALCIUM: 9.3 mg/dL (ref 8.7–10.2)
CO2: 22 mmol/L (ref 20–29)
Chloride: 107 mmol/L — ABNORMAL HIGH (ref 96–106)
Creatinine, Ser: 0.83 mg/dL (ref 0.57–1.00)
GFR, EST AFRICAN AMERICAN: 104 mL/min/{1.73_m2} (ref >59)
GFR, EST NON AFRICAN AMERICAN: 90 mL/min/{1.73_m2} (ref >59)
GLUCOSE: 100 mg/dL — AB (ref 65–99)
Globulin, Total: 2.4 g/dL (ref 1.5–4.5)
POTASSIUM: 4.1 mmol/L (ref 3.5–5.2)
Sodium: 141 mmol/L (ref 134–144)
TOTAL PROTEIN: 6.4 g/dL (ref 6.0–8.5)

## 2016-10-06 LAB — TSH: TSH: 1.59 u[IU]/mL (ref 0.450–4.500)

## 2016-10-06 LAB — FSH/LH
FSH: 6.9 m[IU]/mL
LH: 6.9 m[IU]/mL

## 2016-10-06 LAB — ESTRADIOL: ESTRADIOL: 31.3 pg/mL

## 2016-10-25 ENCOUNTER — Other Ambulatory Visit: Payer: Self-pay | Admitting: Family Medicine

## 2016-10-25 ENCOUNTER — Encounter: Payer: Self-pay | Admitting: Family Medicine

## 2016-10-25 MED ORDER — CITALOPRAM HYDROBROMIDE 40 MG PO TABS: 60.0000 mg | ORAL_TABLET | Freq: Every day | ORAL | 1 refills | Status: DC

## 2016-11-02 ENCOUNTER — Encounter: Payer: Self-pay | Admitting: Family Medicine

## 2016-11-02 NOTE — Telephone Encounter (Signed)
Routing to provider  

## 2016-11-05 ENCOUNTER — Encounter: Payer: Self-pay | Admitting: Family Medicine

## 2016-11-06 ENCOUNTER — Encounter: Payer: Self-pay | Admitting: Family Medicine

## 2016-11-06 ENCOUNTER — Ambulatory Visit (INDEPENDENT_AMBULATORY_CARE_PROVIDER_SITE_OTHER): Payer: BLUE CROSS/BLUE SHIELD | Admitting: Family Medicine

## 2016-11-06 VITALS — BP 115/79 | HR 80 | Temp 97.5°F | Wt 146.0 lb

## 2016-11-06 DIAGNOSIS — F411 Generalized anxiety disorder: Secondary | ICD-10-CM | POA: Diagnosis not present

## 2016-11-06 MED ORDER — BUPROPION HCL 75 MG PO TABS: ORAL_TABLET | ORAL | 2 refills | Status: DC

## 2016-11-06 MED ORDER — CITALOPRAM HYDROBROMIDE 40 MG PO TABS: 40.0000 mg | ORAL_TABLET | Freq: Every day | ORAL | 1 refills | Status: DC

## 2016-11-06 MED ORDER — BUSPIRONE HCL 5 MG PO TABS: 5.0000 mg | ORAL_TABLET | Freq: Three times a day (TID) | ORAL | 3 refills | Status: DC

## 2016-11-06 NOTE — Assessment & Plan Note (Signed)
Will cut back to 40mg  on her celexa. Start low dose wellbutrin and add buspar for acute anxiety. Recheck 1 month. Call with any concerns. FMLA paperwork filled out today.

## 2016-11-06 NOTE — Progress Notes (Signed)
BP 115/79 (BP Location: Left Arm, Patient Position: Sitting, Cuff Size: Normal)   Pulse 80   Temp (!) 97.5 F (36.4 C)   Wt 146 lb (66.2 kg)   LMP 07/13/2016 (Exact Date)   SpO2 96%   BMI 23.57 kg/m    Subjective:    Patient ID: Sheila Benton, female    DOB: 1980/06/09, 36 y.o.   MRN: [id number]  HPI: Sheila Benton is a 36 y.o. female  Chief Complaint  Patient presents with  . Anxiety    FMLA Paperwork   ANXIETY/STRESS- has not been doing well on her medicine. She notes that taking 81m daily of the celexa made her very jittery. She notes that taking the 665m40mg regimen started making her feel very sleepy for 3-4 days, has been sleeping all night, then sleeping 5-6 hours during the day Duration:exacerbated Anxious mood: yes  Excessive worrying: no Irritability: no  Sweating: yes Nausea: no Palpitations:no Hyperventilation: no Panic attacks: no Agoraphobia: no  Obscessions/compulsions: yes Depressed mood: no [facility] 11/06/2016 06/14/2016 06/14/2016 12/15/2015 07/19/2015  Decreased Interest 0 2 2 0 2  Down, Depressed, Hopeless 0 1 0 0 0  PHQ - 2 Score 0 3 2 0 2  Altered sleeping 3 3 - - 3  Tired, decreased energy 3 3 - - 3  Change in appetite 3 0 - - 3  Feeling bad or failure about yourself  0 0 - - 0  Trouble concentrating 3 0 - - 3  Moving slowly or fidgety/restless 0 0 - - 0  Suicidal thoughts 0 0 - - 0  PHQ-9 Score 12 9 - - 14   GAD 7 : Generalized Anxiety Score 11/06/2016 06/14/2016 08/19/2015 07/19/2015  Nervous, Anxious, on Edge 3 2 1 3   Control/stop worrying 3 3 1 3   Worry too much - different things 2 3 1 3   Trouble relaxing 3 3 2 3   Restless 3 3 0 3  Easily annoyed or irritable 2 3 2 3   Afraid - awful might happen 3 2 2 1   Total GAD 7 Score 19 19 9 19   Anxiety Difficulty - Somewhat difficult Somewhat difficult Somewhat difficult   Anhedonia: no Weight changes: yes Insomnia: no   Hypersomnia: yes Fatigue/loss of energy: yes Feelings  of worthlessness: no Feelings of guilt: no Impaired concentration/indecisiveness: no Suicidal ideations: no  Crying spells: no Recent Stressors/Life Changes: yes   Relationship problems: no   Family stress: yes     Financial stress: no    Job stress: no    Recent death/loss: no  Relevant past medical, surgical, family and social history reviewed and updated as indicated. Interim medical history since our last visit reviewed. Allergies and medications reviewed and updated.  Review of Systems  Constitutional: Positive for fatigue. Negative for activity change, appetite change, diaphoresis, fever and unexpected weight change.  Respiratory: Negative.   Cardiovascular: Negative.   Neurological: Negative.   Psychiatric/Behavioral: Positive for agitation, decreased concentration and sleep disturbance. Negative for behavioral problems, confusion, dysphoric mood, hallucinations, self-injury and suicidal ideas. The patient is nervous/anxious. The patient is not hyperactive.     Per HPI unless specifically indicated above     Objective:    BP 115/79 (BP Location: Left Arm, Patient Position: Sitting, Cuff Size: Normal)   Pulse 80   Temp (!) 97.5 F (36.4 C)   Wt 146 lb (66.2 kg)   LMP 07/13/2016 (Exact Date)   SpO2 96%   BMI 23.57 kg/m  Wt Readings from Last 3 Encounters:  11/06/16 146 lb (66.2 kg)  10/05/16 146 lb (66.2 kg)  09/05/16 140 lb (63.5 kg)    Physical Exam  Constitutional: She is oriented to person, place, and time. She appears well-developed and well-nourished. No distress.  HENT:  Head: Normocephalic and atraumatic.  Right Ear: Hearing normal.  Left Ear: Hearing normal.  Nose: Nose normal.  Eyes: Conjunctivae and lids are normal. Right eye exhibits no discharge. Left eye exhibits no discharge. No scleral icterus.  Cardiovascular: Normal rate, regular rhythm, normal heart sounds and intact distal pulses.  Exam reveals no gallop and no friction rub.   No murmur  heard. Pulmonary/Chest: Effort normal and breath sounds normal. No respiratory distress. She has no wheezes. She has no rales. She exhibits no tenderness.  Musculoskeletal: Normal range of motion.  Neurological: She is alert and oriented to person, place, and time.  Skin: Skin is warm, dry and intact. No rash noted. She is not diaphoretic. No erythema. No pallor.  Psychiatric: She has a normal mood and affect. Her speech is normal and behavior is normal. Judgment and thought content normal. Cognition and memory are normal.  Nursing note and vitals reviewed.   Results for orders placed or performed in visit on 10/05/16  TSH  Result Value Ref Range   TSH 1.590 0.450 - 4.500 uIU/mL  Comp Met (CMET)  Result Value Ref Range   Glucose 100 (H) 65 - 99 mg/dL   BUN 8 6 - 20 mg/dL   Creatinine, Ser 0.83 0.57 - 1.00 mg/dL   GFR calc non Af Amer 90 >59 mL/min/1.73   GFR calc Af Amer 104 >59 mL/min/1.73   BUN/Creatinine Ratio 10 9 - 23   Sodium 141 134 - 144 mmol/L   Potassium 4.1 3.5 - 5.2 mmol/L   Chloride 107 (H) 96 - 106 mmol/L   CO2 22 20 - 29 mmol/L   Calcium 9.3 8.7 - 10.2 mg/dL   Total Protein 6.4 6.0 - 8.5 g/dL   Albumin 4.0 3.5 - 5.5 g/dL   Globulin, Total 2.4 1.5 - 4.5 g/dL   Albumin/Globulin Ratio 1.7 1.2 - 2.2   Bilirubin Total <0.2 0.0 - 1.2 mg/dL   Alkaline Phosphatase 81 39 - 117 IU/L   AST 15 0 - 40 IU/L   ALT 11 0 - 32 IU/L  FSH/LH  Result Value Ref Range   LH 6.9 mIU/mL   FSH 6.9 mIU/mL  Estradiol  Result Value Ref Range   Estradiol 31.3 pg/mL      Assessment & Plan:   Problem List Items Addressed This Visit      Other   Anxiety disorder - Primary    Will cut back to 53m on her celexa. Start low dose wellbutrin and add buspar for acute anxiety. Recheck 1 month. Call with any concerns. FMLA paperwork filled out today.      Relevant Orders   CBC with Differential/Platelet   Comprehensive metabolic panel   Thyroid Panel With TSH       Follow up  plan: Return in about 4 weeks (around 12/04/2016) for Follow up mood.

## 2016-11-07 LAB — COMPREHENSIVE METABOLIC PANEL
ALK PHOS: 89 IU/L (ref 39–117)
ALT: 10 IU/L (ref 0–32)
AST: 12 IU/L (ref 0–40)
Albumin/Globulin Ratio: 1.6 (ref 1.2–2.2)
Albumin: 4.2 g/dL (ref 3.5–5.5)
BILIRUBIN TOTAL: 0.4 mg/dL (ref 0.0–1.2)
BUN / CREAT RATIO: 12 (ref 9–23)
BUN: 10 mg/dL (ref 6–20)
CO2: 24 mmol/L (ref 20–29)
Calcium: 9.6 mg/dL (ref 8.7–10.2)
Chloride: 103 mmol/L (ref 96–106)
Creatinine, Ser: 0.82 mg/dL (ref 0.57–1.00)
GFR, EST AFRICAN AMERICAN: 106 mL/min/{1.73_m2} (ref >59)
GFR, EST NON AFRICAN AMERICAN: 92 mL/min/{1.73_m2} (ref >59)
GLUCOSE: 72 mg/dL (ref 65–99)
Globulin, Total: 2.6 g/dL (ref 1.5–4.5)
POTASSIUM: 4.7 mmol/L (ref 3.5–5.2)
Sodium: 141 mmol/L (ref 134–144)
Total Protein: 6.8 g/dL (ref 6.0–8.5)

## 2016-11-07 LAB — THYROID PANEL WITH TSH
Free Thyroxine Index: 1.7 (ref 1.2–4.9)
T3 Uptake Ratio: 24 % (ref 24–39)
T4, Total: 7.1 ug/dL (ref 4.5–12.0)
TSH: 1.27 u[IU]/mL (ref 0.450–4.500)

## 2016-11-07 LAB — CBC WITH DIFFERENTIAL/PLATELET
BASOS ABS: 0.1 10*3/uL (ref 0.0–0.2)
Basos: 1 %
EOS (ABSOLUTE): 0.5 10*3/uL — ABNORMAL HIGH (ref 0.0–0.4)
Eos: 6 %
Hematocrit: 39.8 % (ref 34.0–46.6)
Hemoglobin: 13.1 g/dL (ref 11.1–15.9)
Immature Grans (Abs): 0 10*3/uL (ref 0.0–0.1)
Immature Granulocytes: 0 %
LYMPHS ABS: 2.2 10*3/uL (ref 0.7–3.1)
Lymphs: 24 %
MCH: 30 pg (ref 26.6–33.0)
MCHC: 32.9 g/dL (ref 31.5–35.7)
MCV: 91 fL (ref 79–97)
MONOS ABS: 0.7 10*3/uL (ref 0.1–0.9)
Monocytes: 8 %
Neutrophils Absolute: 5.5 10*3/uL (ref 1.4–7.0)
Neutrophils: 61 %
Platelets: 348 10*3/uL (ref 150–379)
RBC: 4.36 x10E6/uL (ref 3.77–5.28)
RDW: 13.3 % (ref 12.3–15.4)
WBC: 9 10*3/uL (ref 3.4–10.8)

## 2016-11-29 ENCOUNTER — Ambulatory Visit (INDEPENDENT_AMBULATORY_CARE_PROVIDER_SITE_OTHER): Payer: BLUE CROSS/BLUE SHIELD | Admitting: Advanced Practice Midwife

## 2016-11-29 ENCOUNTER — Encounter: Payer: Self-pay | Admitting: Obstetrics & Gynecology

## 2016-11-29 ENCOUNTER — Encounter: Payer: Self-pay | Admitting: Advanced Practice Midwife

## 2016-11-29 VITALS — BP 118/70 | Wt 149.0 lb

## 2016-11-29 DIAGNOSIS — N3000 Acute cystitis without hematuria: Secondary | ICD-10-CM | POA: Diagnosis not present

## 2016-11-29 LAB — POCT URINALYSIS DIPSTICK
Bilirubin, UA: NEGATIVE
Blood, UA: NEGATIVE
Glucose, UA: NEGATIVE
KETONES UA: NEGATIVE
LEUKOCYTES UA: NEGATIVE
NITRITE UA: NEGATIVE
PH UA: 8.5 — AB (ref 5.0–8.0)
PROTEIN UA: NEGATIVE
Spec Grav, UA: 1.01 (ref 1.010–1.025)
Urobilinogen, UA: NEGATIVE E.U./dL — AB

## 2016-11-29 NOTE — Progress Notes (Signed)
S: The patient is here today with c/o uti symptoms. She has had bilateral flank and lower back pain for 2 days. She has also had some left sided pelvic pain. She states that these are the symptoms she has had with past uti's. She denies burning or frequency or inability to empty bladder. She admits to inadequate hydration. She denies any other symptoms.   O: Vital Signs: BP 118/70   Wt 149 lb (67.6 kg)   LMP 07/13/2016 (Exact Date)   BMI 24.05 kg/m  Constitutional: Well nourished, well developed female in no acute distress.  Skin: Warm and dry.  Cardiovascular: Regular rate and rhythm.   Respiratory: Clear to auscultation bilateral. Normal respiratory effort Abdomen: soft, nontender, nondistended, no abnormal masses, no epigastric pain Back: mild tenderness to palpation bilateral flank and lower back Psych: Alert and Oriented x3. No memory deficits. Normal mood and affect.  MS: normal gait, normal bilateral lower extremity ROM/strength/stability.  Urine dip: negative today Results for DORETHEA, STRUBEL (MRN [id number]) as of 11/29/2016 15:20  Ref. Range 11/29/2016 15:19  Bilirubin, UA Unknown neg  Clarity, UA Unknown cloudy  Color, UA Unknown yellow  Glucose Unknown neg  Ketones, UA Unknown neg  Leukocytes, UA Latest Ref Range: Negative  Negative  Nitrite, UA Unknown neg  pH, UA Latest Ref Range: 5.0 - 8.0  8.5 (A)  Protein, UA Unknown neg  Specific Gravity, UA Latest Ref Range: 1.010 - 1.025  1.010  Urobilinogen, UA Latest Ref Range: 0.2 or 1.0 E.U./dL negative (A)  RBC, UA Unknown neg    A: 36 yo female with uti symptoms  P: Urine dip Urine culture Wait on Rx for antibiotics until culture results  Rod Can, CNM

## 2016-11-29 NOTE — Patient Instructions (Signed)

## 2016-12-02 LAB — URINE CULTURE

## 2016-12-06 ENCOUNTER — Ambulatory Visit: Payer: BLUE CROSS/BLUE SHIELD | Admitting: Family Medicine

## 2016-12-07 ENCOUNTER — Encounter: Payer: Self-pay | Admitting: Family Medicine

## 2016-12-07 ENCOUNTER — Ambulatory Visit: Payer: BLUE CROSS/BLUE SHIELD | Admitting: Family Medicine

## 2016-12-10 NOTE — Progress Notes (Unsigned)
Due to power outage at [facility] was unable to get this message until today. Called left a message for patient to call back to reschedule the appointment.   Just FYI

## 2016-12-11 ENCOUNTER — Encounter: Payer: Self-pay | Admitting: Family Medicine

## 2016-12-19 ENCOUNTER — Encounter: Payer: Self-pay | Admitting: Family Medicine

## 2016-12-19 ENCOUNTER — Ambulatory Visit (INDEPENDENT_AMBULATORY_CARE_PROVIDER_SITE_OTHER): Payer: BLUE CROSS/BLUE SHIELD | Admitting: Family Medicine

## 2016-12-19 VITALS — BP 101/70 | HR 99 | Temp 98.5°F | Wt 146.1 lb

## 2016-12-19 DIAGNOSIS — F411 Generalized anxiety disorder: Secondary | ICD-10-CM | POA: Diagnosis not present

## 2016-12-19 DIAGNOSIS — R5382 Chronic fatigue, unspecified: Secondary | ICD-10-CM

## 2016-12-19 DIAGNOSIS — R140 Abdominal distension (gaseous): Secondary | ICD-10-CM

## 2016-12-19 NOTE — Progress Notes (Signed)
BP 101/70 (BP Location: Left Arm, Patient Position: Sitting, Cuff Size: Normal)   Pulse 99   Temp 98.5 F (36.9 C)   Wt 146 lb 1 oz (66.3 kg)   LMP 07/13/2016 (Exact Date)   SpO2 97%   BMI 23.58 kg/m    Subjective:    Patient ID: Sheila Benton, female    DOB: 01/12/1981, 36 y.o.   MRN: [id number]  HPI: Sheila Benton is a 36 y.o. female  Chief Complaint  Patient presents with  . Anxiety   ANXIETY/STRESS- excessive sleeping Duration:uncontrolled Anxious mood: yes  Excessive worrying: yes Irritability: yes  Sweating: no Nausea: no Palpitations:no Hyperventilation: no Panic attacks: no Agoraphobia: no  Obscessions/compulsions: no Depressed mood: yes Depression screen [facility] 2/9 12/19/2016 11/06/2016 06/14/2016 06/14/2016 12/15/2015  Decreased Interest 2 0 2 2 0  Down, Depressed, Hopeless 0 0 1 0 0  PHQ - 2 Score 2 0 3 2 0  Altered sleeping 3 3 3  - -  Tired, decreased energy 3 3 3  - -  Change in appetite 0 3 0 - -  Feeling bad or failure about yourself  0 0 0 - -  Trouble concentrating 3 3 0 - -  Moving slowly or fidgety/restless 0 0 0 - -  Suicidal thoughts 0 0 0 - -  PHQ-9 Score 11 12 9  - -  Difficult doing work/chores Somewhat difficult - - - -   GAD 7 : Generalized Anxiety Score 12/19/2016 11/06/2016 06/14/2016 08/19/2015  Nervous, Anxious, on Edge 3 3 2 1   Control/stop worrying 3 3 3 1   Worry too much - different things 3 2 3 1   Trouble relaxing 3 3 3 2   Restless 3 3 3  0  Easily annoyed or irritable 3 2 3 2   Afraid - awful might happen 3 3 2 2   Total GAD 7 Score 21 19 19 9   Anxiety Difficulty Very difficult - Somewhat difficult Somewhat difficult   Anhedonia: no Weight changes: no Insomnia: no   Hypersomnia: yes Fatigue/loss of energy: yes Feelings of worthlessness: no Feelings of guilt: no Impaired concentration/indecisiveness: no Suicidal ideations: no  Crying spells: no Recent Stressors/Life Changes: no   FATIGUE Duration:  chronic Severity: severe   Onset: gradual Context when symptoms started:  unknown Symptoms improve with rest: no  Depressive symptoms: yes Stress/anxiety: yes Insomnia: no  Snoring: no Observed apnea by bed partner: no Daytime hypersomnolence:yes Wakes feeling refreshed: no History of sleep study: no Dysnea on exertion:  no Orthopnea/PND: no Chest pain: no Chronic cough: no Lower extremity edema: no Arthralgias:no Myalgias: no Weakness: no Rash: no  ABDOMINAL ISSUES Duration: chronic Nature: bloating Location: generalized  Severity: moderate  Radiation: no Episode duration: hours  Frequency: constant Alleviating factors: nothing Aggravating factors: eating anything Treatments attempted: antacids, PPI, H2 Blocker and laxatives Constipation: intermittent Diarrhea: intermittent Mucous in the stool: no Heartburn: no Bloating:yes Flatulence: yes Nausea: yes Vomiting: no Melena or hematochezia: no Rash: no Jaundice: no Fever: no Weight loss: no   Relevant past medical, surgical, family and social history reviewed and updated as indicated. Interim medical history since our last visit reviewed. Allergies and medications reviewed and updated.  Review of Systems  Constitutional: Positive for fatigue. Negative for activity change, appetite change, chills, diaphoresis, fever and unexpected weight change.  Respiratory: Negative.   Cardiovascular: Negative.   Gastrointestinal: Positive for abdominal distention, constipation and diarrhea. Negative for abdominal pain, anal bleeding, blood in stool, nausea, rectal pain and vomiting.  Musculoskeletal: Negative.  Neurological: Negative.   Psychiatric/Behavioral: Positive for dysphoric mood and sleep disturbance. Negative for agitation, behavioral problems, confusion, decreased concentration, hallucinations, self-injury and suicidal ideas. The patient is not nervous/anxious and is not hyperactive.     Per HPI unless specifically indicated above       Objective:    BP 101/70 (BP Location: Left Arm, Patient Position: Sitting, Cuff Size: Normal)   Pulse 99   Temp 98.5 F (36.9 C)   Wt 146 lb 1 oz (66.3 kg)   LMP 07/13/2016 (Exact Date)   SpO2 97%   BMI 23.58 kg/m   Wt Readings from Last 3 Encounters:  12/19/16 146 lb 1 oz (66.3 kg)  11/29/16 149 lb (67.6 kg)  11/06/16 146 lb (66.2 kg)    Physical Exam  Constitutional: She is oriented to person, place, and time. She appears well-developed and well-nourished. No distress.  HENT:  Head: Normocephalic and atraumatic.  Right Ear: Hearing normal.  Left Ear: Hearing normal.  Nose: Nose normal.  Eyes: Conjunctivae and lids are normal. Right eye exhibits no discharge. Left eye exhibits no discharge. No scleral icterus.  Cardiovascular: Normal rate, regular rhythm, normal heart sounds and intact distal pulses.  Exam reveals no gallop and no friction rub.   No murmur heard. Pulmonary/Chest: Effort normal and breath sounds normal. No respiratory distress. She has no wheezes. She has no rales. She exhibits no tenderness.  Musculoskeletal: Normal range of motion.  Neurological: She is alert and oriented to person, place, and time.  Skin: Skin is warm, dry and intact. No rash noted. She is not diaphoretic. No erythema. No pallor.  Psychiatric: She has a normal mood and affect. Her speech is normal and behavior is normal. Judgment and thought content normal. Cognition and memory are normal.  Nursing note and vitals reviewed.   Results for orders placed or performed in visit on 12/19/16  Mononucleosis screen  Result Value Ref Range   Mono Screen Positive (A) Negative  CBC with Differential/Platelet  Result Value Ref Range   WBC 11.4 (H) 3.4 - 10.8 x10E3/uL   RBC 4.34 3.77 - 5.28 x10E6/uL   Hemoglobin 13.1 11.1 - 15.9 g/dL   Hematocrit 39.3 34.0 - 46.6 %   MCV 91 79 - 97 fL   MCH 30.2 26.6 - 33.0 pg   MCHC 33.3 31.5 - 35.7 g/dL   RDW 13.0 12.3 - 15.4 %   Platelets 322 150 - 379  x10E3/uL   Neutrophils 67 Not Estab. %   Lymphs 20 Not Estab. %   Monocytes 9 Not Estab. %   Eos 4 Not Estab. %   Basos 0 Not Estab. %   Neutrophils Absolute 7.6 (H) 1.4 - 7.0 x10E3/uL   Lymphocytes Absolute 2.3 0.7 - 3.1 x10E3/uL   Monocytes Absolute 1.1 (H) 0.1 - 0.9 x10E3/uL   EOS (ABSOLUTE) 0.4 0.0 - 0.4 x10E3/uL   Basophils Absolute 0.1 0.0 - 0.2 x10E3/uL   Immature Granulocytes 0 Not Estab. %   Immature Grans (Abs) 0.0 0.0 - 0.1 x10E3/uL  TSH  Result Value Ref Range   TSH 1.370 0.450 - 4.500 uIU/mL  Gliadin antibodies, serum  Result Value Ref Range   Antigliadin Abs, IgA WILL FOLLOW    Gliadin IgG WILL FOLLOW   Tissue transglutaminase, IgA  Result Value Ref Range   Transglutaminase IgA WILL FOLLOW   Reticulin Antibody, IgA w reflex titer  Result Value Ref Range   Reticulin Ab, IgA Negative Neg:<1:2.5 titer  Lyme Ab/Western Blot  Reflex  Result Value Ref Range   Lyme IgG/IgM Ab WILL FOLLOW    LYME DISEASE AB, QUANT, IGM WILL FOLLOW   Rocky mtn spotted fvr abs pnl(IgG+IgM)  Result Value Ref Range   RMSF IgG WILL FOLLOW    RMSF IgM WILL FOLLOW   Ehrlichia Antibody Panel  Result Value Ref Range   E.Chaffeensis (HME) IgG WILL FOLLOW    E. Chaffeensis (HME) IgM Titer WILL FOLLOW    HGE IgG Titer WILL FOLLOW    HGE IgM Titer WILL FOLLOW   Babesia microti Antibody Panel  Result Value Ref Range   Babesia microti IgM WILL FOLLOW    Babesia microti IgG WILL FOLLOW   Epstein-Barr virus VCA antibody panel  Result Value Ref Range   EBV VCA IgM WILL FOLLOW    EBV Early Antigen Ab, IgG WILL FOLLOW    EBV VCA IgG WILL FOLLOW    EBV NA IgG WILL FOLLOW    Interpretation: Comment   CMV IgM  Result Value Ref Range   CMV IgM Ser EIA-aCnc <30.0 0.0 - 29.9 AU/mL  Cmv antibody, IgG (EIA)  Result Value Ref Range   CMV Ab - IgG <0.60 0.00 - 0.59 U/mL      Assessment & Plan:   Problem List Items Addressed This Visit      Other   Anxiety disorder    Stable. She does not think  that this is the problem with her fatigue. Feels like her mood is doing OK. Will continue current regimen and get her into sleep. Call with any concerns.       Chronic fatigue - Primary    Has been going on about a year. She doesn't think that her mood has anything to do with this. Really affecting her life. Sleeping 16+ hours a day. Will get her into sleep specialist for evaluation. Referral generated today.      Relevant Orders   Mononucleosis screen (Completed)   CBC with Differential/Platelet (Completed)   TSH (Completed)   Lyme Ab/Western Blot Reflex (Completed)   Rocky mtn spotted fvr abs pnl(IgG+IgM) (Completed)   Ehrlichia Antibody Panel (Completed)   Babesia microti Antibody Panel (Completed)   Epstein-Barr virus VCA antibody panel (Completed)   CMV IgM (Completed)   Cmv antibody, IgG (EIA)   Ambulatory referral to Sleep Studies    Other Visit Diagnoses    Bloating       Has been going on a long while. No better following hysterectomy. Will check labs and get into GI- referral generated today.   Relevant Orders   Gliadin antibodies, serum (Completed)   Tissue transglutaminase, IgA (Completed)   Reticulin Antibody, IgA w reflex titer (Completed)   Ambulatory referral to Gastroenterology       Follow up plan: Return in about 4 weeks (around 01/16/2017).

## 2016-12-19 NOTE — Patient Instructions (Signed)
[facility] - [place] [address] [place], [place] [address]  Main: [facility]  [address] [place], [phone] [id number] SLEEP MAIN PHONE: [phone]

## 2016-12-20 ENCOUNTER — Encounter: Payer: Self-pay | Admitting: Family Medicine

## 2016-12-20 DIAGNOSIS — R5383 Other fatigue: Secondary | ICD-10-CM | POA: Insufficient documentation

## 2016-12-20 NOTE — Assessment & Plan Note (Signed)
Has been going on about a year. She doesn't think that her mood has anything to do with this. Really affecting her life. Sleeping 16+ hours a day. Will get her into sleep specialist for evaluation. Referral generated today.

## 2016-12-20 NOTE — Assessment & Plan Note (Signed)
Stable. She does not think that this is the problem with her fatigue. Feels like her mood is doing OK. Will continue current regimen and get her into sleep. Call with any concerns.

## 2016-12-21 ENCOUNTER — Telehealth: Payer: Self-pay | Admitting: Family Medicine

## 2016-12-21 LAB — LYME AB/WESTERN BLOT REFLEX

## 2016-12-21 LAB — CBC WITH DIFFERENTIAL/PLATELET
BASOS: 0 %
Basophils Absolute: 0.1 10*3/uL (ref 0.0–0.2)
EOS (ABSOLUTE): 0.4 10*3/uL (ref 0.0–0.4)
EOS: 4 %
HEMATOCRIT: 39.3 % (ref 34.0–46.6)
HEMOGLOBIN: 13.1 g/dL (ref 11.1–15.9)
IMMATURE GRANS (ABS): 0 10*3/uL (ref 0.0–0.1)
IMMATURE GRANULOCYTES: 0 %
Lymphocytes Absolute: 2.3 10*3/uL (ref 0.7–3.1)
Lymphs: 20 %
MCH: 30.2 pg (ref 26.6–33.0)
MCHC: 33.3 g/dL (ref 31.5–35.7)
MCV: 91 fL (ref 79–97)
MONOCYTES: 9 %
MONOS ABS: 1.1 10*3/uL — AB (ref 0.1–0.9)
NEUTROS PCT: 67 %
Neutrophils Absolute: 7.6 10*3/uL — ABNORMAL HIGH (ref 1.4–7.0)
Platelets: 322 10*3/uL (ref 150–379)
RBC: 4.34 x10E6/uL (ref 3.77–5.28)
RDW: 13 % (ref 12.3–15.4)
WBC: 11.4 10*3/uL — ABNORMAL HIGH (ref 3.4–10.8)

## 2016-12-21 LAB — BABESIA MICROTI ANTIBODY PANEL

## 2016-12-21 LAB — RETICULIN ANTIBODIES, IGA W TITER: RETICULIN AB, IGA: NEGATIVE {titer}

## 2016-12-21 LAB — EPSTEIN-BARR VIRUS VCA ANTIBODY PANEL
EBV Early Antigen Ab, IgG: <9 U/mL (ref 0.0–8.9)
EBV NA IgG: <18 U/mL (ref 0.0–17.9)

## 2016-12-21 LAB — GLIADIN ANTIBODIES, SERUM
Antigliadin Abs, IgA: 5 units (ref 0–19)
GLIADIN IGG: 2 U (ref 0–19)

## 2016-12-21 LAB — ROCKY MTN SPOTTED FVR ABS PNL(IGG+IGM)
RMSF IgG: NEGATIVE
RMSF IgM: 0.65 index (ref 0.00–0.89)

## 2016-12-21 LAB — MONONUCLEOSIS SCREEN: Mono Screen: POSITIVE — AB

## 2016-12-21 LAB — EHRLICHIA ANTIBODY PANEL
E. Chaffeensis (HME) IgM Titer: NEGATIVE
E.Chaffeensis (HME) IgG: NEGATIVE
HGE IGM TITER: NEGATIVE
HGE IgG Titer: NEGATIVE

## 2016-12-21 LAB — CMV IGM: CMV IgM Ser EIA-aCnc: <30 AU/mL (ref 0.0–29.9)

## 2016-12-21 LAB — TISSUE TRANSGLUTAMINASE, IGA

## 2016-12-21 LAB — CMV ANTIBODY, IGG (EIA): CMV Ab - IgG: <0.6 U/mL (ref 0.00–0.59)

## 2016-12-21 LAB — TSH: TSH: 1.37 u[IU]/mL (ref 0.450–4.500)

## 2016-12-21 NOTE — Telephone Encounter (Signed)
This encounter was created in error - please disregard.

## 2016-12-21 NOTE — Telephone Encounter (Signed)
Called patient, no answer, left message for patient to return my call.

## 2016-12-21 NOTE — Telephone Encounter (Signed)
Patient notified

## 2016-12-21 NOTE — Telephone Encounter (Signed)
Patient called for Lab Results. Please call her back once you have an opportunity. Thank You!!! [facility]

## 2016-12-21 NOTE — Telephone Encounter (Signed)
Please let her know that her labs for the most part look good. It looks like she was exposed to Mono some time in the past- this is not recent and she does not have a current infection with Mono. She does not have celiac. She does not have any tick diseases. Everything else looks good. Any questions let me know, but there wasn't anything that should be causing her fatigue, so I still want her to see the sleep doctor. Thanks!

## 2017-01-02 ENCOUNTER — Encounter: Payer: Self-pay | Admitting: Gastroenterology

## 2017-01-02 ENCOUNTER — Other Ambulatory Visit: Payer: Self-pay

## 2017-01-02 ENCOUNTER — Ambulatory Visit: Payer: BLUE CROSS/BLUE SHIELD | Admitting: Gastroenterology

## 2017-01-02 VITALS — BP 110/71 | HR 80 | Temp 98.5°F | Ht 66.0 in | Wt 150.0 lb

## 2017-01-02 DIAGNOSIS — R1013 Epigastric pain: Secondary | ICD-10-CM

## 2017-01-02 DIAGNOSIS — R195 Other fecal abnormalities: Secondary | ICD-10-CM

## 2017-01-02 MED ORDER — PSYLLIUM 28 % PO PACK: 1.0000 | PACK | Freq: Every morning | ORAL | 11 refills | Status: DC

## 2017-01-02 MED ORDER — RANITIDINE HCL 150 MG PO TABS: 150.0000 mg | ORAL_TABLET | Freq: Every day | ORAL | 3 refills | Status: DC

## 2017-01-02 NOTE — Progress Notes (Signed)
Sheila Antigua, MD [address] [place], [place], [place], [address] [address] [place], [place], [place], [address] Phone: [phone]  Fax: [phone]  Consultation  Referring Provider:     Valerie Roys, DO Primary Care Physician:  Valerie Roys, DO Primary Gastroenterologist:  Dr. Bonna Gains         Reason for Consultation:     Abdominal Bloating  Date of Admission:  (Not on file) Date of Consultation:  01/02/2017         HPI:   Sheila Benton is a 36 y.o. female presents with abdominal pain of 2-3 month duration. It is located diffusely in the abdomen, 4/10 in severity, cramping in quality. Her main symptom is abdominal bloating diffusely. She has hx of anxiety that she states is uncontrolled at this time and they working on getting it controlled. Reports hx of hysterectomy in May 2018 for endometriosis. CT scan earlier this year showed malrotation of gut. No weight loss, no N/V. States has not had to restrict diet, eats regular diet but has bloating right after it and has to have a BM after it. BM are mostly lose and has them 3-4 times a day, without blood. No anemia, no family hx of colon cancer.   Past Medical History:  Diagnosis Date  . ADD (attention deficit disorder)   . Anemia   . Anxiety   . Asthma    WELL CONTROLLED  . Bipolar affective disorder, manic ([place])   . Headache    MIGRAINES  . History of kidney stones   . Irregular menses     Past Surgical History:  Procedure Laterality Date  . TUBAL LIGATION  12/24/2003    Prior to Admission medications   Medication Sig Start Date End Date Taking? Authorizing Provider  albuterol (PROVENTIL) (2.5 MG/3ML) 0.083% nebulizer solution Take 3 mLs (2.5 mg total) by nebulization every 6 (six) hours as needed for wheezing or shortness of breath. 04/27/16  Yes Johnson, Megan P, DO  busPIRone (BUSPAR) 5 MG tablet Take 1-2 tablets (5-10 mg total) by mouth 3 (three) times daily. 11/06/16  Yes Johnson, Megan P, DO    citalopram (CELEXA) 40 MG tablet Take 1 tablet (40 mg total) by mouth daily. 11/06/16  Yes Johnson, Megan P, DO  fluticasone furoate-vilanterol (BREO ELLIPTA) 200-25 MCG/INH AEPB Inhale 1 puff into the lungs daily. Patient taking differently: Inhale 1 puff into the lungs every morning.  04/27/16  Yes Johnson, Megan P, DO  naproxen (NAPROSYN) 500 MG tablet Take 1 tablet (500 mg total) by mouth 2 (two) times daily with a meal. 06/14/16  Yes Johnson, Megan P, DO  PROAIR HFA 108 (90 Base) MCG/ACT inhaler INHALE 1 PUFF INTO THE LUNGS EVERY 6 HOURS AS NEEDED FOR WHEEZING OR SHORTNESS OF BREATH 08/08/16  Yes Volney American, PA-C    Family History  Problem Relation Age of Onset  . Hypertension Mother   . Endometriosis Sister   . Heart disease Maternal Grandmother   . Hypertension Maternal Grandmother   . Stroke Maternal Grandmother   . Lung cancer Maternal Grandfather      Social History   Tobacco Use  . Smoking status: Current Every Day Smoker    Types: E-cigarettes  . Smokeless tobacco: Never Used  Substance Use Topics  . Alcohol use: No  . Drug use: No    Allergies as of 01/02/2017 - Review Complete 01/02/2017  Allergen Reaction Noted  . Omnipaque [iohexol] Itching 04/26/2015  Review of Systems:    All systems reviewed and negative except where noted in HPI.   Physical Exam:  Vital signs in last 24 hours: @VSRANGES @   General:   Pleasant, cooperative in NAD Head:  Normocephalic and atraumatic. Eyes:   No icterus.   Conjunctiva pink. PERRLA. Ears:  Normal auditory acuity. Neck:  Supple; no masses or thyroidomegaly Lungs: Respirations even and unlabored. Lungs clear to auscultation bilaterally.   No wheezes, crackles, or rhonchi.  Heart:  Regular rate and rhythm;  Without murmur, clicks, rubs or gallops Abdomen:  Soft, nondistended, mildly tender when palpated with hand in the midepigastric region, but no tenderness when stethoscope was used to press the same region.  Normal bowel sounds. No appreciable masses or hepatomegaly.  No rebound or guarding.  Rectal:  Not performed. Msk:  Symmetrical without gross deformities.  Strength 5/5 UE and LE b/l Extremities:  Without edema, cyanosis or clubbing. Neurologic:  Alert and oriented x3;  grossly normal neurologically. Skin:  Intact without significant lesions or rashes. Cervical Nodes:  No significant cervical adenopathy. Psych:  Alert and cooperative. Normal affect.  LAB RESULTS: No results for input(s): WBC, HGB, HCT, PLT in the last 72 hours. BMET No results for input(s): NA, K, CL, CO2, GLUCOSE, BUN, CREATININE, CALCIUM in the last 72 hours. LFT No results for input(s): PROT, ALBUMIN, AST, ALT, ALKPHOS, BILITOT, BILIDIR, IBILI in the last 72 hours. PT/INR No results for input(s): LABPROT, INR in the last 72 hours.  Oct 2018 labs reviewed STUDIES: No results found. Last CT scan reviewed   Impression / Plan:   Sheila Benton is a 36 y.o. y/o female with abdominal bloating and acid reflux with no alarm symptoms, weight loss here for initial visit  Will check Stool for H Pylori Pt asked to start Zantac daily after collecting stool as tums really help with her heartburn Will also add metamucil as bulking agent to help her have more bulky bowel movements during the morning with the hope of allowing her to have less frequent bowel movements all day Will observe for symptom improvement No indication for endoscopy at this time in the absence of alarm symptoms and reassuring clinical, lab and imaging findings.  She acknowledges that her anxiety is uncontrolled and that her symptoms might be related to that as well. Abdominal bloating improving after BM could point to IBS-D as well.   Thank you for involving me in the care of this patient.     Virgel Manifold, MD  01/02/2017, 11:14 AM

## 2017-01-04 ENCOUNTER — Other Ambulatory Visit
Admission: RE | Admit: 2017-01-04 | Discharge: 2017-01-04 | Disposition: A | Discharge status: Home / Self Care | Payer: BLUE CROSS/BLUE SHIELD | Source: Ambulatory Visit | Attending: Gastroenterology | Admitting: Gastroenterology

## 2017-01-07 ENCOUNTER — Other Ambulatory Visit: Payer: Self-pay

## 2017-01-07 ENCOUNTER — Ambulatory Visit
Admission: EM | Admit: 2017-01-07 | Discharge: 2017-01-07 | Disposition: A | Discharge status: Home / Self Care | Payer: BLUE CROSS/BLUE SHIELD | Attending: Family Medicine | Admitting: Family Medicine

## 2017-01-07 ENCOUNTER — Encounter: Payer: Self-pay | Admitting: Emergency Medicine

## 2017-01-07 DIAGNOSIS — R05 Cough: Secondary | ICD-10-CM | POA: Diagnosis not present

## 2017-01-07 DIAGNOSIS — J9801 Acute bronchospasm: Secondary | ICD-10-CM

## 2017-01-07 DIAGNOSIS — B9789 Other viral agents as the cause of diseases classified elsewhere: Secondary | ICD-10-CM | POA: Diagnosis not present

## 2017-01-07 DIAGNOSIS — J069 Acute upper respiratory infection, unspecified: Secondary | ICD-10-CM

## 2017-01-07 MED ORDER — ALBUTEROL SULFATE HFA 108 (90 BASE) MCG/ACT IN AERS: 1.0000 | INHALATION_SPRAY | Freq: Four times a day (QID) | RESPIRATORY_TRACT | 0 refills | Status: DC | PRN

## 2017-01-07 NOTE — ED Provider Notes (Signed)
[facility]    CSN: [id number] Arrival date & time: 01/07/17  3762     History   Chief Complaint Chief Complaint  Patient presents with  . Cough  . Generalized Body Aches  . Fever    HPI Sheila Benton is a 36 y.o. female.   The history is provided by the patient.  URI  Presenting symptoms: congestion, cough, fatigue and rhinorrhea   Severity:  Moderate Onset quality:  Sudden Duration:  4 days Timing:  Constant Progression:  Worsening Chronicity:  New Relieved by:  Prescription medications and OTC medications Associated symptoms: wheezing   Risk factors: chronic respiratory disease (asthma)   Risk factors: not elderly, no chronic cardiac disease, no chronic kidney disease, no diabetes mellitus, no immunosuppression, no recent illness, no recent travel and no sick contacts     Past Medical History:  Diagnosis Date  . ADD (attention deficit disorder)   . Anemia   . Anxiety   . Asthma    WELL CONTROLLED  . Bipolar affective disorder, manic ([place])   . Headache    MIGRAINES  . History of kidney stones   . Irregular menses     Patient Active Problem List   Diagnosis Date Noted  . Chronic fatigue 12/20/2016  . Endometriosis 07/24/2016  . Pelvic pain in female 07/24/2016  . 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  . Anxiety disorder 03/10/2015  . Asthma 02/10/2015    Past Surgical History:  Procedure Laterality Date  . ABDOMINAL HYSTERECTOMY    . TUBAL LIGATION  12/24/2003    OB History    Gravida Para Term Preterm AB Living   3 3 3          SAB TAB Ectopic Multiple Live Births                   Home Medications    Prior to Admission medications   Medication Sig Start Date End Date Taking? Authorizing Provider  busPIRone (BUSPAR) 5 MG tablet Take 1-2 tablets (5-10 mg total) by mouth 3 (three) times daily. Patient taking differently: Take 5-10 mg as needed by mouth.  11/06/16  Yes Johnson, Megan P, DO  citalopram (CELEXA) 40 MG  tablet Take 1 tablet (40 mg total) by mouth daily. 11/06/16  Yes Johnson, Megan P, DO  fluticasone furoate-vilanterol (BREO ELLIPTA) 200-25 MCG/INH AEPB Inhale 1 puff into the lungs daily. Patient taking differently: Inhale 1 puff into the lungs every morning.  04/27/16  Yes Johnson, Megan P, DO  albuterol (PROVENTIL HFA;VENTOLIN HFA) 108 (90 Base) MCG/ACT inhaler Inhale 1-2 puffs every 6 (six) hours as needed into the lungs for wheezing or shortness of breath. 01/07/17   Norval Gable, MD  naproxen (NAPROSYN) 500 MG tablet Take 1 tablet (500 mg total) by mouth 2 (two) times daily with a meal. 06/14/16   Johnson, Megan P, DO  psyllium (METAMUCIL SMOOTH TEXTURE) 28 % packet Take 1 packet every morning by mouth. 01/02/17 01/02/18  Virgel Manifold, MD  ranitidine (ZANTAC) 150 MG tablet Take 1 tablet (150 mg total) at bedtime by mouth. 01/02/17 02/01/17  Virgel Manifold, MD    Family History Family History  Problem Relation Age of Onset  . Hypertension Mother   . Endometriosis Sister   . Heart disease Maternal Grandmother   . Hypertension Maternal Grandmother   . Stroke Maternal Grandmother   . Lung cancer Maternal Grandfather     Social History Social History   Tobacco Use  .  Smoking status: Current Every Day Smoker    Types: E-cigarettes  . Smokeless tobacco: Never Used  Substance Use Topics  . Alcohol use: No  . Drug use: No     Allergies   Contrast media [iodinated diagnostic agents] and Omnipaque [iohexol]   Review of Systems Review of Systems  Constitutional: Positive for fatigue.  HENT: Positive for congestion and rhinorrhea.   Respiratory: Positive for cough and wheezing.      Physical Exam Triage Vital Signs ED Triage Vitals  Enc Vitals Group     BP 01/07/17 0952 117/69     Pulse Rate 01/07/17 0952 95     Resp 01/07/17 0952 16     Temp 01/07/17 0952 98.3 F (36.8 C)     Temp Source 01/07/17 0952 Oral     SpO2 01/07/17 0952 96 %     Weight 01/07/17 0949  145 lb (65.8 kg)     Height 01/07/17 0949 5\' 6"  (1.676 m)     Head Circumference --      Peak Flow --      Pain Score 01/07/17 0949 2     Pain Loc --      Pain Edu? --      Excl. in [place]? --    No data found.  Updated Vital Signs BP 117/69 (BP Location: Left Arm)   Pulse 95   Temp 98.3 F (36.8 C) (Oral)   Resp 16   Ht 5\' 6"  (1.676 m)   Wt 145 lb (65.8 kg)   LMP 07/13/2016 (Exact Date)   SpO2 96%   BMI 23.40 kg/m   Visual Acuity Right Eye Distance:   Left Eye Distance:   Bilateral Distance:    Right Eye Near:   Left Eye Near:    Bilateral Near:     Physical Exam  Constitutional: She appears well-developed and well-nourished. No distress.  HENT:  Head: Normocephalic and atraumatic.  Right Ear: Tympanic membrane, external ear and ear canal normal.  Left Ear: Tympanic membrane, external ear and ear canal normal.  Nose: No mucosal edema, rhinorrhea, nose lacerations, sinus tenderness, nasal deformity, septal deviation or nasal septal hematoma. No epistaxis.  No foreign bodies. Right sinus exhibits no maxillary sinus tenderness and no frontal sinus tenderness. Left sinus exhibits no maxillary sinus tenderness and no frontal sinus tenderness.  Mouth/Throat: Uvula is midline, oropharynx is clear and moist and mucous membranes are normal. No oropharyngeal exudate.  Eyes: Conjunctivae and EOM are normal. Pupils are equal, round, and reactive to light. Right eye exhibits no discharge. Left eye exhibits no discharge. No scleral icterus.  Neck: Normal range of motion. Neck supple. No thyromegaly present.  Cardiovascular: Normal rate, regular rhythm and normal heart sounds.  Pulmonary/Chest: Effort normal and breath sounds normal. No respiratory distress. She has no wheezes. She has no rales.  Lymphadenopathy:    She has no cervical adenopathy.  Skin: She is not diaphoretic.  Nursing note and vitals reviewed.    UC Treatments / Results  Labs (all labs ordered are listed, but  only abnormal results are displayed) Labs Reviewed - No data to display  EKG  EKG Interpretation None       Radiology No results found.  Procedures Procedures (including critical care time)  Medications Ordered in UC Medications - No data to display   Initial Impression / Assessment and Plan / UC Course  I have reviewed the triage vital signs and the nursing notes.  Pertinent labs & imaging  results that were available during my care of the patient were reviewed by me and considered in my medical decision making (see chart for details).       Final Clinical Impressions(s) / UC Diagnoses   Final diagnoses:  Viral URI with cough  Bronchospasm    ED Discharge Orders        Ordered    albuterol (PROVENTIL HFA;VENTOLIN HFA) 108 (90 Base) MCG/ACT inhaler  Every 6 hours PRN     01/07/17 1006     1. diagnosis reviewed with patient 2. rx as per orders above; reviewed possible side effects, interactions, risks and benefits  3. Recommend supportive treatment with rest, fluids, otc cold/cough meds 4. Follow-up prn if symptoms worsen or don't improve  Controlled Substance Prescriptions  Controlled Substance Registry consulted? Not Applicable   Norval Gable, MD 01/07/17 1131

## 2017-01-07 NOTE — ED Triage Notes (Signed)
Patient c/o cough, congestion, fever, and bodyaches that started on Friday.

## 2017-01-09 ENCOUNTER — Ambulatory Visit (INDEPENDENT_AMBULATORY_CARE_PROVIDER_SITE_OTHER): Payer: BLUE CROSS/BLUE SHIELD | Admitting: Family Medicine

## 2017-01-09 ENCOUNTER — Encounter: Payer: Self-pay | Admitting: Family Medicine

## 2017-01-09 VITALS — BP 106/71 | HR 101 | Temp 97.7°F | Wt 143.5 lb

## 2017-01-09 DIAGNOSIS — J4541 Moderate persistent asthma with (acute) exacerbation: Secondary | ICD-10-CM | POA: Diagnosis not present

## 2017-01-09 MED ORDER — BENZONATATE 200 MG PO CAPS: 200.0000 mg | ORAL_CAPSULE | Freq: Two times a day (BID) | ORAL | 0 refills | Status: DC | PRN

## 2017-01-09 MED ORDER — HYDROCOD POLST-CPM POLST ER 10-8 MG/5ML PO SUER: 5.0000 mL | Freq: Two times a day (BID) | ORAL | 0 refills | Status: DC | PRN

## 2017-01-09 MED ORDER — AMOXICILLIN-POT CLAVULANATE 875-125 MG PO TABS: 1.0000 | ORAL_TABLET | Freq: Two times a day (BID) | ORAL | 0 refills | Status: DC

## 2017-01-09 MED ORDER — PREDNISONE 10 MG PO TABS: ORAL_TABLET | ORAL | 0 refills | Status: DC

## 2017-01-09 MED ORDER — MECLIZINE HCL 25 MG PO TABS: 25.0000 mg | ORAL_TABLET | Freq: Three times a day (TID) | ORAL | 0 refills | Status: DC | PRN

## 2017-01-09 NOTE — Progress Notes (Signed)
   BP 106/71 (BP Location: Left Arm, Patient Position: Sitting, Cuff Size: Normal)   Pulse (!) 101   Temp 97.7 F (36.5 C) (Oral)   Wt 143 lb 8 oz (65.1 kg)   LMP 07/13/2016 (Exact Date)   SpO2 97%   BMI 23.16 kg/m    Subjective:    Patient ID: Sheila Benton, female    DOB: 10-14-1980, 36 y.o.   MRN: [id number]  HPI: Sheila Benton is a 36 y.o. female  Chief Complaint  Patient presents with  . URI    Patient went to [facility] Monday. Was dx w/ URI. Patient states she's no better. X's 5 days.  . Cough  . Nasal Congestion  . Shortness of Breath  . Dizziness   Dizziness, SOB, wheezing, dry cough, congestion x 6-8 days. Doing breathing treatments every 4 hours, breo, albuterol prn, taking nyquil cold and flu all with minimal relief. Several sick contacts. Denies fever, chills, body aches. Hx of asthma.   Relevant past medical, surgical, family and social history reviewed and updated as indicated. Interim medical history since our last visit reviewed. Allergies and medications reviewed and updated.  Review of Systems  Constitutional: Negative.   HENT: Positive for congestion.   Eyes: Negative.   Respiratory: Positive for cough, chest tightness, shortness of breath and wheezing.   Cardiovascular: Negative.   Gastrointestinal: Negative.   Genitourinary: Negative.   Musculoskeletal: Negative.   Skin: Negative for rash.  Neurological: Positive for dizziness.  Psychiatric/Behavioral: Negative.     Per HPI unless specifically indicated above     Objective:    BP 106/71 (BP Location: Left Arm, Patient Position: Sitting, Cuff Size: Normal)   Pulse (!) 101   Temp 97.7 F (36.5 C) (Oral)   Wt 143 lb 8 oz (65.1 kg)   LMP 07/13/2016 (Exact Date)   SpO2 97%   BMI 23.16 kg/m   Wt Readings from Last 3 Encounters:  01/09/17 143 lb 8 oz (65.1 kg)  01/07/17 145 lb (65.8 kg)  01/02/17 150 lb (68 kg)    Physical Exam  Constitutional: She is oriented to person, place, and time. She  appears well-developed and well-nourished. No distress.  HENT:  Head: Atraumatic.  Oropharynx erythematous Nasal mucosa injected with discharge present  Eyes: Conjunctivae are normal. Pupils are equal, round, and reactive to light. No scleral icterus.  Neck: Normal range of motion. Neck supple.  Cardiovascular: Normal rate and normal heart sounds.  Pulmonary/Chest: Effort normal. No respiratory distress. She has wheezes. She has no rales.  Musculoskeletal: Normal range of motion.  Neurological: She is alert and oriented to person, place, and time.  Skin: Skin is warm and dry.  Psychiatric: She has a normal mood and affect. Her behavior is normal.  Nursing note and vitals reviewed.     Assessment & Plan:   Problem List Items Addressed This Visit      Respiratory   Asthma - Primary    Will treat with augmentin, prednisone, tessalon, tussionex, and regular inhaler regimen. F/u if worsening or no improvement      Relevant Medications   predniSONE (DELTASONE) 10 MG tablet       Follow up plan: Return if symptoms worsen or fail to improve.

## 2017-01-10 NOTE — Patient Instructions (Signed)
Follow up if no improvement 

## 2017-01-10 NOTE — Assessment & Plan Note (Signed)
Will treat with augmentin, prednisone, tessalon, tussionex, and regular inhaler regimen. F/u if worsening or no improvement

## 2017-01-23 ENCOUNTER — Encounter: Payer: Self-pay | Admitting: Neurology

## 2017-01-23 ENCOUNTER — Ambulatory Visit (INDEPENDENT_AMBULATORY_CARE_PROVIDER_SITE_OTHER): Payer: BLUE CROSS/BLUE SHIELD | Admitting: Neurology

## 2017-01-23 VITALS — BP 114/68 | HR 97 | Ht 66.0 in | Wt 147.0 lb

## 2017-01-23 DIAGNOSIS — G4710 Hypersomnia, unspecified: Secondary | ICD-10-CM | POA: Diagnosis not present

## 2017-01-23 DIAGNOSIS — B9789 Other viral agents as the cause of diseases classified elsewhere: Secondary | ICD-10-CM

## 2017-01-23 DIAGNOSIS — J069 Acute upper respiratory infection, unspecified: Secondary | ICD-10-CM | POA: Diagnosis not present

## 2017-01-23 DIAGNOSIS — G4719 Other hypersomnia: Secondary | ICD-10-CM | POA: Insufficient documentation

## 2017-01-23 DIAGNOSIS — F419 Anxiety disorder, unspecified: Secondary | ICD-10-CM | POA: Diagnosis not present

## 2017-01-23 DIAGNOSIS — J4520 Mild intermittent asthma, uncomplicated: Secondary | ICD-10-CM

## 2017-01-23 NOTE — Progress Notes (Signed)
SLEEP MEDICINE CLINIC   Provider:  Larey Seat, M D  Primary Care Physician:  Valerie Roys, DO Referring Provider: Valerie Roys, DO   Chief Complaint  Patient presents with  . New Patient (Initial Visit)    New patient sleep consult.  . Fatigue    HPI:  Sheila Benton is a 36 y.o. female , seen here as in a referral  from Dr. Wynetta Emery for a sleep evaluation,  Sheila Benton is a 36 year old Caucasian mother of 3, right-handed, living with 3 dogs.  She reports having been sleepier than her peers all her life, never having difficulties to sleep in any situation, location and usually feeling that she sleeps a little too much.  She does have asthma since childhood and respiratory as well as some skin allergies.  Occasionally she will be short of breath, coughing and wheezing but this is not a nightly occurrence and she does not feel that it explains her nonrestorative non-refreshing sleep.  She also suffers from anxiety and fatigue, status post hysterectomy on 24 Jul 2016 and tubal ligation on 01 December 2003.   Chief complaint according to patient :" I am just so sleepy"- ' I am anxious but not depressed "  Sleep habits are as follows:  She usually goes to bed between 10 and 11 PM and is asleep promptly.  Her bedroom is conducive to sleep, quiet, dark ,cool. She wakes up 2-3 times at night and usually goes to the bathroom, but it is not the urge to urinate that wakes her in the first place.  She has a bedroom with her husband and 2 dogs.  She is a side sleeper and sleeps on 2 pillows, but has a flat mattress and is not adjustable. Her husband has been snoring, but has not reported or witnessed her to do the same.  She considers her sleep deep and sound, she is not easily aroused.  She worries a lot but it does not keep her from going to sleep. After each bathroom break she is promptly asleep again, she reports vivid dreams sometimes several times at night, and sometimes the story  continues in different installments.  She has never experienced sleep paralysis, she is not sure that she dreams during short naps, and reports no dream intrusion or hypnagogic hallucination. She wakes up in the morning around 7 AM but by that time her husband's alarm has already rang several times.  She does not feel necessarily refreshed but she does not feel an energy surge in the morning either.  She naps almost every day -often inadvertently, she falls asleep in different situations if she is not physically active or mentally stimulated.  She always gets 8 hours of nocturnal sleep if not more.  Sleep medical history and family sleep history: she also has a family history of cystic fibrosis and diabetes affecting 2 nieces have cystic fibrosis, 2 maternal uncles had cystic fibrosis.  Both parents are alcoholics.  Maternal history of diabetes in  mother and maternal grandmother. Grandfather maternal had lung cancer, maternal grandmother had thyroid cancer.   Social history: She quit smoking cigarettes over 4 years ago but she does use a vapor. ETOH- none, and caffeine -  Mountain dew - 2 a day, I cup of coffee in AM, no teas, no energy drinks.   Review of Systems: Out of a complete 14 system review, the patient complains of only the following symptoms, and all other reviewed systems are negative.  How likely are you to doze in the following situations: 0 = not likely, 1 = slight chance, 2 = moderate chance, 3 = high chance  Sitting and Reading? 3 Watching Television? 2 Sitting inactive in a public place (theater or meeting)? 3 Lying down in the afternoon when circumstances permit?3 Sitting and talking to someone?3 Sitting quietly after lunch without alcohol?1 In a car, while stopped for a few minutes in traffic?3 As a passenger in a car for an hour without a break?1  Total =19 !!!!   , Fatigue severity score 56  , depression score 8 on PHQ12  Social History   Socioeconomic History  .  Marital status: Married    Spouse name: Not on file  . Number of children: Not on file  . Years of education: Not on file  . Highest education level: Not on file  Social Needs  . Financial resource strain: Not on file  . Food insecurity - worry: Not on file  . Food insecurity - inability: Not on file  . Transportation needs - medical: Not on file  . Transportation needs - non-medical: Not on file  Occupational History  . Not on file  Tobacco Use  . Smoking status: Current Every Day Smoker    Types: E-cigarettes  . Smokeless tobacco: Never Used  Substance and Sexual Activity  . Alcohol use: No  . Drug use: No  . Sexual activity: Yes    Comment: Tubes Tied  Other Topics Concern  . Not on file  Social History Narrative  . Not on file    Family History  Problem Relation Age of Onset  . Hypertension Mother   . Endometriosis Sister   . Heart disease Maternal Grandmother   . Hypertension Maternal Grandmother   . Stroke Maternal Grandmother   . Lung cancer Maternal Grandfather     Past Medical History:  Diagnosis Date  . ADD (attention deficit disorder)   . Anemia   . Anxiety   . Asthma    WELL CONTROLLED  . Bipolar affective disorder, manic ([place])   . Headache    MIGRAINES  . History of kidney stones   . Irregular menses     Past Surgical History:  Procedure Laterality Date  . ABDOMINAL HYSTERECTOMY    . CYSTOSCOPY  07/24/2016   Procedure: CYSTOSCOPY;  Surgeon: Gae Dry, MD;  Location: [facility] ORS;  Service: Gynecology;;  . LAPAROSCOPIC BILATERAL SALPINGECTOMY Bilateral 07/24/2016   Procedure: LAPAROSCOPIC BILATERAL SALPINGECTOMY;  Surgeon: Gae Dry, MD;  Location: [facility] OR[facility];  Service: Gynecology;  Laterality: Bilateral;  . LAPAROSCOPIC HYSTERECTOMY N/A 07/24/2016   Procedure: HYSTERECTOMY TOTAL LAPAROSCOPIC;  Surgeon: Gae Dry, MD;  Location: [facility] OR[facility];  Service: Gynecology;  Laterality: N/A;  . TUBAL LIGATION  12/24/2003    Current Outpatient  Medications  Medication Sig Dispense Refill  . albuterol (PROVENTIL HFA;VENTOLIN HFA) 108 (90 Base) MCG/ACT inhaler Inhale 1-2 puffs every 6 (six) hours as needed into the lungs for wheezing or shortness of breath. 1 Inhaler 0  . citalopram (CELEXA) 40 MG tablet Take 1 tablet (40 mg total) by mouth daily. 90 tablet 1  . fluticasone furoate-vilanterol (BREO ELLIPTA) 200-25 MCG/INH AEPB Inhale 1 puff into the lungs daily. (Patient taking differently: Inhale 1 puff into the lungs every morning. ) 60 each 6  . ranitidine (ZANTAC) 150 MG tablet Take 1 tablet (150 mg total) at bedtime by mouth. 30 tablet 3   No current facility-administered medications for this visit.  Allergies as of 01/23/2017 - Review Complete 01/23/2017  Allergen Reaction Noted  . Contrast media [iodinated diagnostic agents] Rash 01/07/2017  . Omnipaque [iohexol] Itching 04/26/2015    Vitals: BP 114/68   Pulse 97   Ht 5\' 6"  (1.676 m)   Wt 147 lb (66.7 kg)   LMP 07/13/2016 (Exact Date)   BMI 23.73 kg/m  Last Weight:  Wt Readings from Last 1 Encounters:  01/23/17 147 lb (66.7 kg)   ZOX:WRUE mass index is 23.73 kg/m.     Last Height:   Ht Readings from Last 1 Encounters:  01/23/17 5\' 6"  (1.676 m)    Physical exam:  General: The patient is awake, alert and appears not in acute distress. The patient is well groomed. Head: Normocephalic, atraumatic. Neck is supple. Mallampati 3 neck circumference:14.5 . Nasal airflow congested. Retrognathia is not seen.  Cardiovascular:  Regular rate and rhythm, without  murmurs or carotid bruit, and without distended neck veins. Respiratory: Lungs are clear to auscultation. Skin:  Without evidence of edema, or rash Trunk: BMI is 24. The patient's posture is erect  Neurologic exam : The patient is awake and alert, oriented to place and time.   Memory subjective described as intact.  Attention span & concentration ability appears normal.  Speech is fluent,  without  dysarthria, dysphonia or aphasia.  Mood and affect are appropriate.  Cranial nerves: Pupils are equal and briskly reactive to light. Funduscopic exam without evidence of pallor or edema. Extraocular movements  in vertical and horizontal planes intact and without nystagmus. Visual fields by finger perimetry are intact.Hearing to finger rub intact.  Facial sensation intact to fine touch. Facial motor strength is symmetric and tongue and uvula move midline. Shoulder shrug was symmetrical.   Motor exam:   Normal tone, muscle bulk and symmetric strength in all extremities. Sensory:  Fine touch, pinprick and vibration were tested in all extremities. Proprioception tested in the upper extremities was normal. Coordination: Rapid alternating movements in the fingers/hands was normal. Finger-to-nose maneuver  normal without evidence of ataxia, dysmetria or tremor. Gait and station: Patient walks without assistive device -Turns with 3 Steps. Romberg testing is negative.  Deep tendon reflexes: in the  upper and lower extremities are brisk,  symmetric and intact. Babinski maneuver response is  downgoing.   I was able to review Dr. Bard Herbert provided information, the patient's medication list, which includes as needed Tussionex, Antivert, Deltasone, prescribed for asthma attack and upper respiratory viral infection.  She also has Zantac, Metamucil, the Brio Ellipta inhaler, Celexa 40 mg daily, BuSpar 5 mg 3 times daily.  Tessalon, and Proventil inhaler as needed.  She describes chronic fatigue with severe duration as well as excessive daytime sleepiness with an Epworth score of 19 points, she has no problems with vital signs, her blood pressure is normal and I reviewed her CBC and differential which looked exactly as it should be.  TSH was normal, she is negative for bleeding antibodies, she just recently was tested for Lyme disease, which was negative, Babesia negative, Epstein-Barr negative, CMV  negative.  Assessment:  After physical and neurologic examination, review of laboratory studies,  Personal review of imaging studies, reports of other /same  Imaging studies, results of polysomnography and / or neurophysiology testing and pre-existing records as far as provided in visit., my assessment is   1) I am quite fascinated with Mrs. Goracke is lifelong history of being excessively daytime sleepy, often struggling with staying awake and daytime but without symptoms  associated with narcolepsy or cataplexy. I would like to order a PSG, to see which other organic sleep disorders may be present.  It would be difficult to order an M SLT, which is the gold standard for evaluation of narcolepsy, in a patient who is on so many REM sleep suppressant medications including citalopram and BuSpar.  She told me that she no longer takes BuSpar, but she is on Celexa which is a REM sleep suppressant medication and would render an M SLT invalid. My first goal is to reason for apnea and if this is not found we will invite her for a PSG followed by M SLT after she weans off Celexa for at least 3 weeks.  SSRIs stay in the system for that long and would influence the sleep architecture greatly.  The patient was advised of the nature of the diagnosed disorder , the treatment options and the  risks for general health and wellness arising from not treating the condition.   I spent more than 40 minutes of face to face time with the patient.  Greater than 50% of time was spent in counseling and coordination of care. We have discussed the diagnosis and differential and I answered the patient's questions.    Plan:  Treatment plan and additional workup : HST to rule out apnea- follow by PSG and MSLT if no apnea is found. Celexa weaning 3 weeks prior to test, cannot be done in this calendar year.    Larey Seat, MD 86/75/4492, 0:10 AM  Certified in Neurology by ABPN Certified in [place] by [facility]  [facility] [address] [place], [place] [address]

## 2017-01-23 NOTE — Patient Instructions (Signed)
Hypersomnia Hypersomnia is when you feel extremely tired during the day even though you're getting plenty of sleep at night. You may need to take naps during the day, and you may also be extremely difficult to wake up when you are sleeping. What are the causes? The cause of your hypersomnia may not be known. Hypersomnia may be caused by:  Medicines.  Sleep disorders, such as narcolepsy.  Trauma or injury to your head or nervous system.  Using drugs or alcohol.  Tumors.  Medical conditions, such as depression or hypothyroidism.  Genetics.  What are the signs or symptoms? The main symptoms of hypersomnia include:  Feeling extremely tired throughout the day.  Being very difficult to wake up.  Sleeping for longer and longer periods.  Taking naps throughout the day.  Other symptoms may include:  Feeling: ? Restless. ? Annoyed. ? Anxious. ? Low energy.  Having difficulty: ? Remembering. ? Speaking. ? Thinking.  Losing your appetite.  Experiencing hallucinations.  How is this diagnosed? Hypersomnia may be diagnosed by:  Medical history and physical exam. This will include a sleep history.  Completing sleep logs.  Tests may also be done, such as: ? Polysomnography. ? Multiple sleep latency test (MSLT).  How is this treated? There is no cure for hypersomnia, but treatment can be very effective in helping manage the condition. Treatment may include:  Lifestyle and sleeping strategies to help cope with the condition.  Stimulant medicines.  Treating any underlying causes of hypersomnia.  Follow these instructions at home:  Take medicines only as directed by your health care provider.  Schedule short naps for when you feel sleepiest during the day. Tell your employer or teachers that you have hypersomnia. You may be able to adjust your schedule to include time for naps.  Avoid drinking alcohol or caffeinated beverages.  Do not eat a heavy meal before  bedtime. Eat at about the same times every day.  Do not drive or operate heavy machinery if you are sleepy.  Do not swim or go out on the water without a life jacket.  If possible, adjust your schedule so that you do not have to work or be active at night.  Keep all follow-up visits as directed by your health care provider. This is important. Contact a health care provider if:  You have new symptoms.  Your symptoms get worse. Get help right away if: You have serious thoughts of hurting yourself or someone else. This information is not intended to replace advice given to you by your health care provider. Make sure you discuss any questions you have with your health care provider. Document Released: 02/02/2002 Document Revised: 07/21/2015 Document Reviewed: 09/17/2013 Elsevier Interactive Patient Education  2018 Elsevier Inc.  

## 2017-01-28 ENCOUNTER — Encounter: Payer: Self-pay | Admitting: Family Medicine

## 2017-01-29 ENCOUNTER — Telehealth: Payer: Self-pay | Admitting: Family Medicine

## 2017-01-29 NOTE — Telephone Encounter (Signed)
Copied from [place]. Topic: Quick Communication - See Telephone Encounter >> Jan 29, 2017  2:30 PM Percell Belt A wrote: CRM for notification. See Telephone encounter for: Jonelle Sidle from [facility] [phone]  Fax number [phone] She called in and said that they rec'd a referral for this pt.  They are needing the chart notes and Demographics .  Please fax this info to above number   01/29/17.

## 2017-01-29 NOTE — Telephone Encounter (Signed)
Information faxed

## 2017-02-11 ENCOUNTER — Ambulatory Visit (INDEPENDENT_AMBULATORY_CARE_PROVIDER_SITE_OTHER): Payer: BLUE CROSS/BLUE SHIELD | Admitting: Neurology

## 2017-02-11 DIAGNOSIS — G4710 Hypersomnia, unspecified: Secondary | ICD-10-CM | POA: Diagnosis not present

## 2017-02-11 DIAGNOSIS — F419 Anxiety disorder, unspecified: Secondary | ICD-10-CM

## 2017-02-11 DIAGNOSIS — B9789 Other viral agents as the cause of diseases classified elsewhere: Secondary | ICD-10-CM

## 2017-02-11 DIAGNOSIS — J4520 Mild intermittent asthma, uncomplicated: Secondary | ICD-10-CM

## 2017-02-11 DIAGNOSIS — G4719 Other hypersomnia: Secondary | ICD-10-CM

## 2017-02-11 DIAGNOSIS — J069 Acute upper respiratory infection, unspecified: Secondary | ICD-10-CM

## 2017-02-19 ENCOUNTER — Other Ambulatory Visit: Payer: Self-pay | Admitting: Neurology

## 2017-02-19 DIAGNOSIS — G4710 Hypersomnia, unspecified: Secondary | ICD-10-CM

## 2017-02-19 DIAGNOSIS — G4719 Other hypersomnia: Secondary | ICD-10-CM

## 2017-02-19 DIAGNOSIS — R102 Pelvic and perineal pain: Secondary | ICD-10-CM

## 2017-02-19 DIAGNOSIS — R5382 Chronic fatigue, unspecified: Secondary | ICD-10-CM

## 2017-02-19 NOTE — Progress Notes (Signed)
Patient was given instructions for weaning CELAXE-  Reduce 50% dose for one week, reduce by another

## 2017-02-19 NOTE — Procedures (Signed)
[facility] [place], [place] [address] NAME:  Sheila Benton                                                        DOB: 111-23-1982 MEDICAL RECORD NUMBER  [id number]                                         DOS: 02/11/2017 REFERRING PHYSICIAN: Park Liter, D.O.  STUDY PERFORMED: HST, Apnea link  HISTORY: Sheila Benton is a 36 year old Caucasian mother of 3, right-handed, living with her children and 3 dogs.  She reports having been sleepier than her peers all her life, never having difficulties to sleep in any situation, location and usually feeling that she sleeps a little too much.  She does have asthma since childhood and respiratory as well as some skin allergies.  Occasionally she will be short of breath, coughing and wheezing but this is not a nightly occurrence and she does not feel that it explains her non-restorative non-refreshing sleep.  She also suffers from anxiety and fatigue, is status post hysterectomy on 24 Jul 2016 and tubal ligation on 01 December 2003. Chief complaint according to patient:" I am just so sleepy"- I am anxious but not depressed". Epworth Sleepiness score endorsed at 19 points, Fatigue severity score at 56, depression score 8 on PHQ12. BMI is 23.7.   STUDY RESULTS: Total Recording Time: 7 hours, 19 minutes Total Apnea/Hypopnea Index (AHI):  0.4 /hr. and RDI was 4.8/hr.  Average Oxygen Saturation: 95 %; Lowest Oxygen Desaturation: 91%  Total Time in Oxygen Saturation Below 89% was 10 minutes.  Average Heart Rate:  84 bpm (66-122 bpm)   IMPRESSION: NO evidence of clinically significant sleep disordered breathing- The degree of excessive daytime sleepiness remains unexplained.  RECOMMENDATION: Narcolepsy evaluation by HLA test and, if possible, by MSLT.  I certify that I have reviewed the raw data recording prior to the issuance of this report in accordance with the standards of the American Academy of Sleep Medicine  (AASM). Larey Seat, M.D.       02-19-2017   Medical Director of [facility] at [facility], [place] of the ABPN and ABSM and accredited by AASM

## 2017-02-20 ENCOUNTER — Encounter: Payer: Self-pay | Admitting: Neurology

## 2017-02-21 ENCOUNTER — Other Ambulatory Visit: Payer: Self-pay | Admitting: Neurology

## 2017-02-21 DIAGNOSIS — G47419 Narcolepsy without cataplexy: Secondary | ICD-10-CM

## 2017-03-04 ENCOUNTER — Other Ambulatory Visit: Payer: Self-pay | Admitting: Family Medicine

## 2017-03-07 IMAGING — CT CT ABDOMEN W/ CM
1 of 2 series · 14 of 32 positions shown, 19 images · IV contrast (APPLIED)
Comparison: 02/07/2013

CLINICAL DATA: Cramping along the upper abdomen.  Dysuria.

EXAM:
CT ABDOMEN WITH CONTRAST
TECHNIQUE: Multidetector CT imaging of the abdomen was performed using the
standard protocol following bolus administration of intravenous
contrast.
CONTRAST:  100mL OMNIPAQUE IOHEXOL 300 MG/ML  SOLN
The patient experienced itching after the contrast dose. No hives.
No respiratory distress or wheezing. Administered 50 mg of oral
Benadryl. The patient's husband is driving. This is felt to
represent a minor reaction to contrast.

[Series 2: axial st · axial · 0.57mm/px · z∈[-725,-485]mm · 14 of 54 slices shown, 19 images]
[im 3/54  soft-tissue]
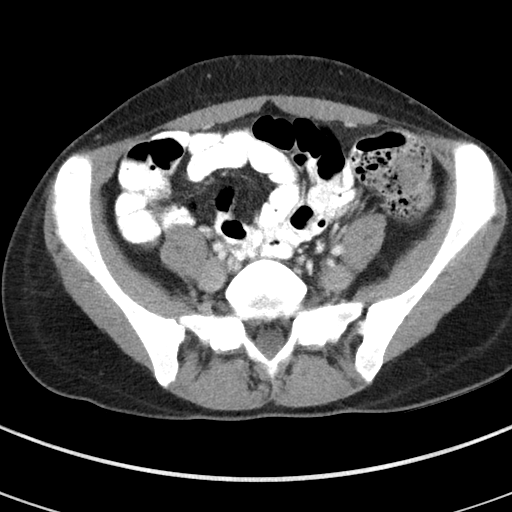
[im 3/54  bone]
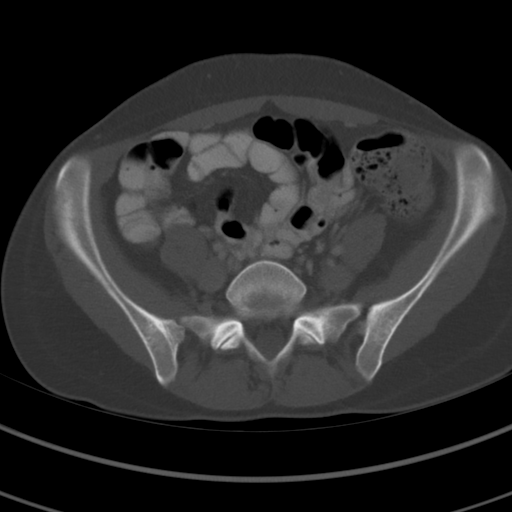
[im 8/54  soft-tissue]
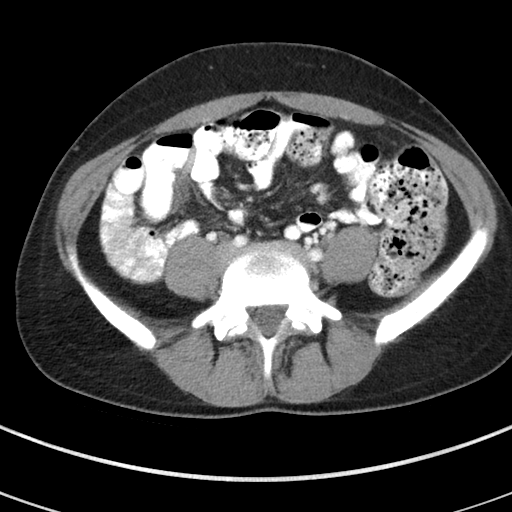
[im 11/54  soft-tissue]
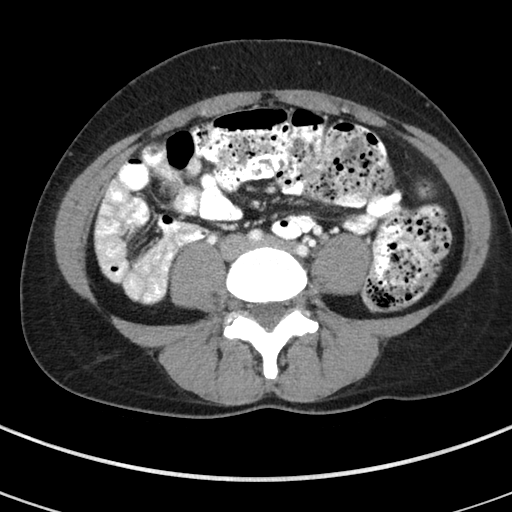
[im 16/54  soft-tissue]
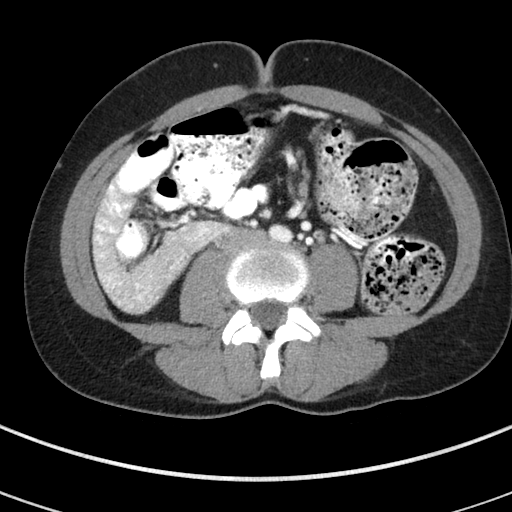
[im 18/54  soft-tissue]
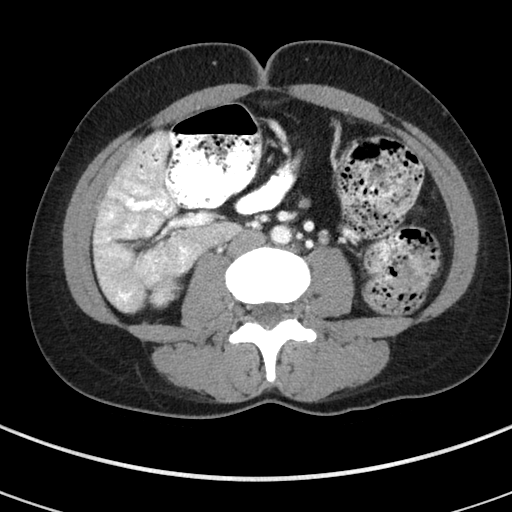
[im 23/54  soft-tissue]
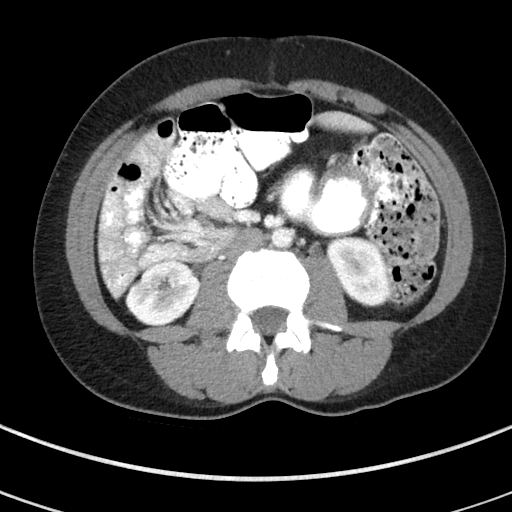
[im 28/54  soft-tissue]
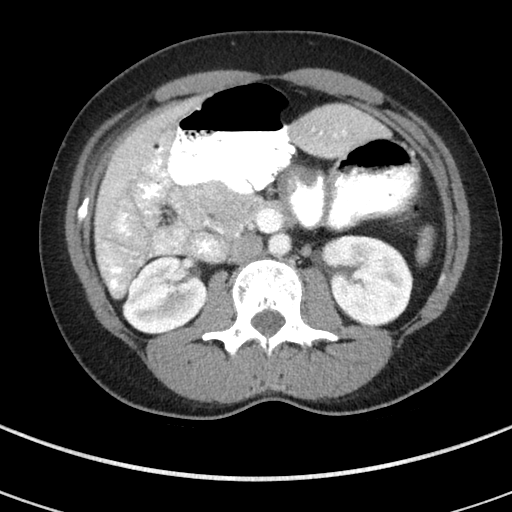
[im 31/54  soft-tissue]
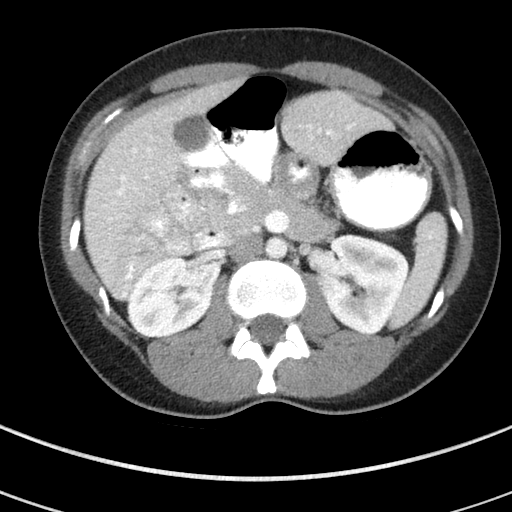
[im 36/54  soft-tissue]
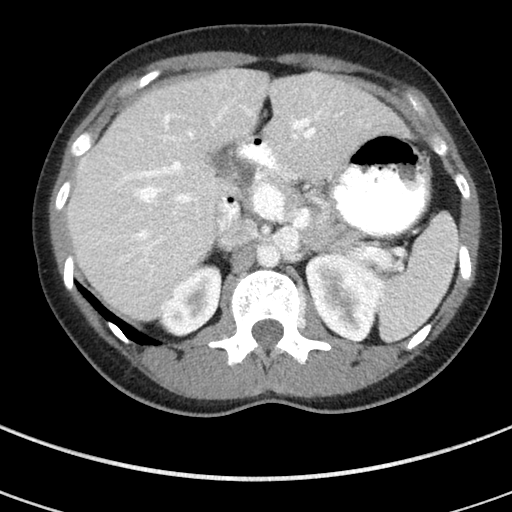
[im 36/54  bone]
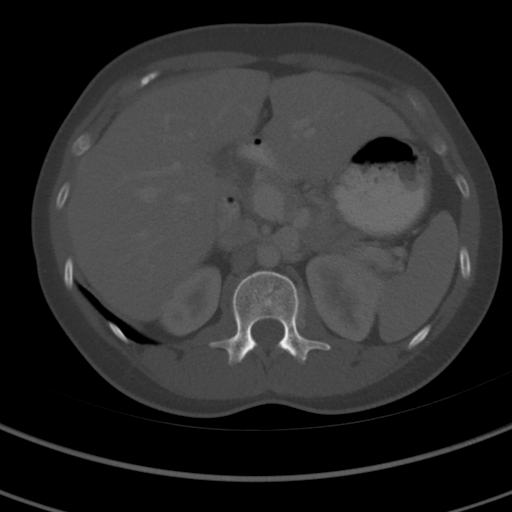
[im 38/54  soft-tissue]
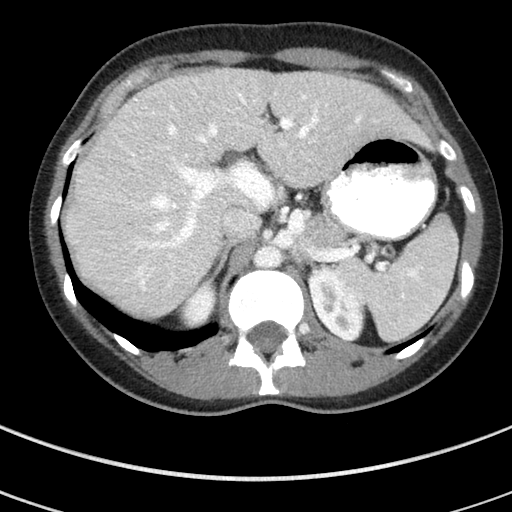
[im 43/54  soft-tissue]
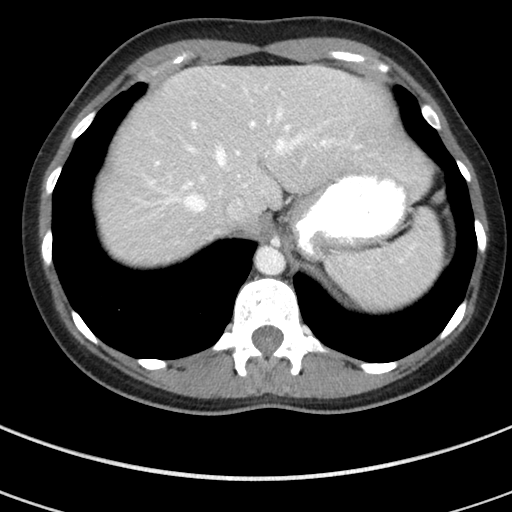
[im 43/54  lung]
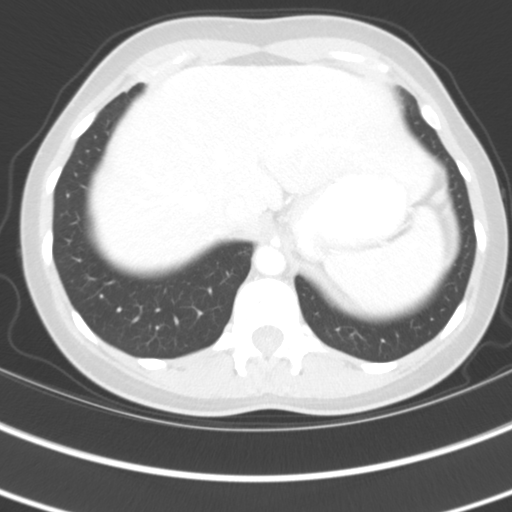
[im 46/54  soft-tissue]
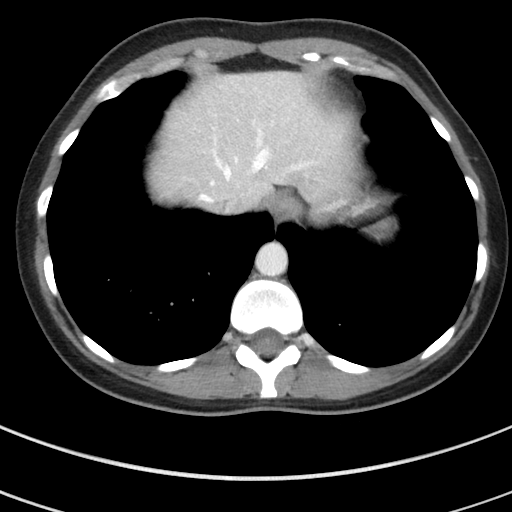
[im 46/54  lung]
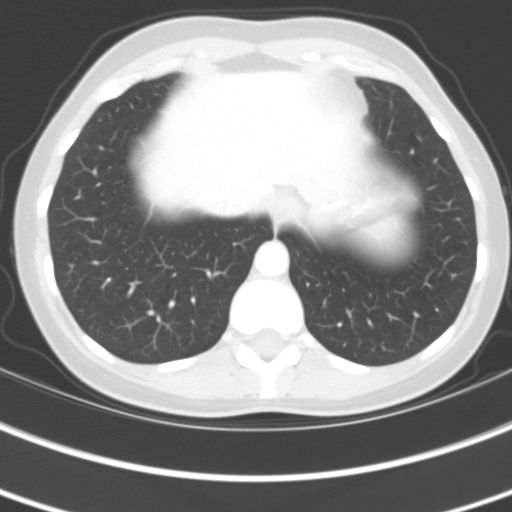
[im 48/54  lung]
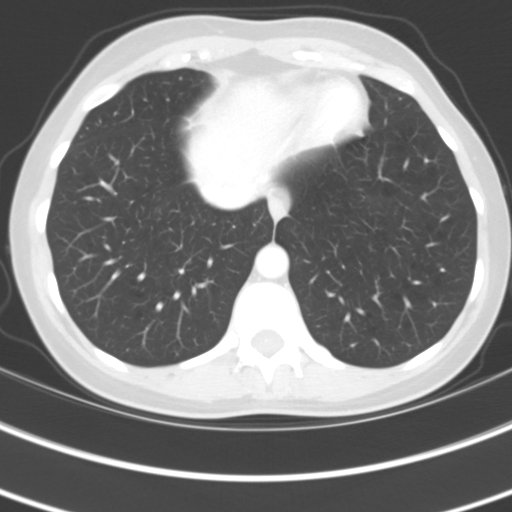
[im 51/54  soft-tissue]
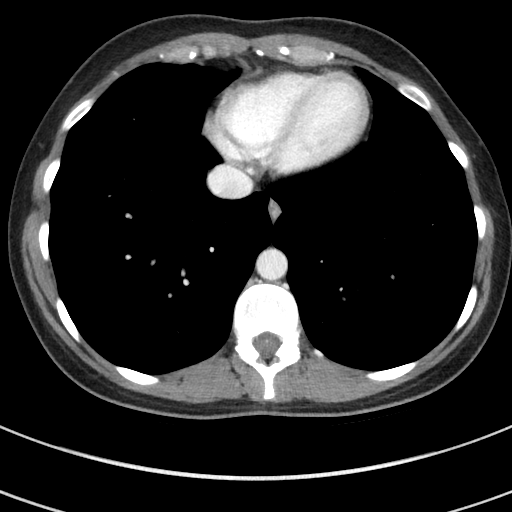
[im 51/54  lung]
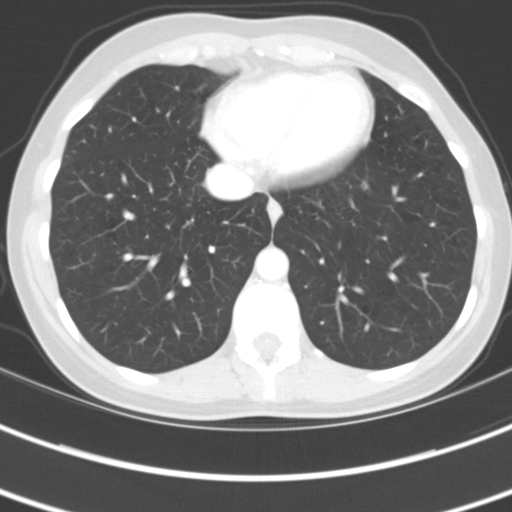

[14 of 32 positions shown; findings below may reference images not displayed]

FINDINGS: Lower chest:  Unremarkable

Hepatobiliary: Common bile duct 6 mm, mildly prominent

Pancreas: Unremarkable

Spleen: Unremarkable

Adrenals/Urinary Tract: Mild scarring in the right kidney most
notable in the lower pole anteriorly. 5 mm hypodense lesion in the
right kidney lower pole posteriorly, probably a cyst but technically
nonspecific. Left kidney unremarkable.

Stomach/Bowel: The cecum is in the central upper abdomen just below
the gallbladder fossa. Terminal ileum unremarkable. Borderline
prominence the colon caliber. The appendix appears normal, extending
just below the stomach antrum. For the most part the small bowel is
in the right abdomen and colon in the left abdomen, and the duodenum
does not appeared to cross midline, compatible with malrotation of
bowel. Suspected periampullary duodenal diverticulum containing
contrast medium.

Vascular/Lymphatic: Unremarkable

Other: No supplemental non-categorized findings.

Musculoskeletal: Unremarkable
IMPRESSION: 1. The colon is primarily in the left abdomen and small bowel
primarily in the right abdomen, and the duodenum does not appear to
cross back across midline in the third portion. This appearance is
compatible with malrotation of bowel.
2. Small periampullary duodenal diverticulum containing contrast
medium.
3. The cecum is in the central upper abdomen just below the
gallbladder fossa. Appendix normal.
4. There is scarring in the right kidney lower pole.

## 2017-03-20 ENCOUNTER — Encounter: Payer: Self-pay | Admitting: Family Medicine

## 2017-03-20 ENCOUNTER — Ambulatory Visit: Payer: BLUE CROSS/BLUE SHIELD | Admitting: Family Medicine

## 2017-03-20 VITALS — BP 121/85 | HR 62 | Temp 97.9°F | Wt 152.0 lb

## 2017-03-20 DIAGNOSIS — J019 Acute sinusitis, unspecified: Secondary | ICD-10-CM | POA: Diagnosis not present

## 2017-03-20 MED ORDER — AMOXICILLIN-POT CLAVULANATE 875-125 MG PO TABS
1.0000 | ORAL_TABLET | Freq: Two times a day (BID) | ORAL | 0 refills | Status: DC
Start: 2017-03-20 — End: 2017-04-18

## 2017-03-20 NOTE — Progress Notes (Signed)
BP 121/85   Pulse 62   Temp 97.9 F (36.6 C) (Oral)   Wt 152 lb (68.9 kg)   LMP 07/13/2016 (Exact Date)   SpO2 95%   BMI 24.53 kg/m    Subjective:    Patient ID: Sheila Benton, female    DOB: 01-22-1981, 37 y.o.   MRN: [id number]  HPI: Sheila Benton is a 36 y.o. female  Chief Complaint  Patient presents with  . URI    Started this morning.   Patient with several days of head congestion drainage facial pressure.  Systemic symptoms of fever chills generalized achiness and weakness is also been going on.  This is gradually getting worse this point where patient is having some nosebleeds when blowing her nose coughing up mucus drainage which is blood tinged.  But does not feel this is coming from her lungs.  Patient is not a smoker last smoked over 3 years ago no real cough or bronchitis type symptoms.  Relevant past medical, surgical, family and social history reviewed and updated as indicated. Interim medical history since our last visit reviewed. Allergies and medications reviewed and updated.  Review of Systems  Constitutional: Positive for chills, fatigue and fever.  HENT: Positive for congestion, nosebleeds, postnasal drip, rhinorrhea, sinus pressure, sinus pain and sore throat.   Respiratory: Positive for cough. Negative for shortness of breath.   Cardiovascular: Negative.     Per HPI unless specifically indicated above     Objective:    BP 121/85   Pulse 62   Temp 97.9 F (36.6 C) (Oral)   Wt 152 lb (68.9 kg)   LMP 07/13/2016 (Exact Date)   SpO2 95%   BMI 24.53 kg/m   Wt Readings from Last 3 Encounters:  03/20/17 152 lb (68.9 kg)  01/23/17 147 lb (66.7 kg)  01/09/17 143 lb 8 oz (65.1 kg)    Physical Exam  Constitutional: She is oriented to person, place, and time. She appears well-developed and well-nourished.  HENT:  Head: Normocephalic and atraumatic.  Right Ear: External ear normal.  Left Ear: External ear normal.  Mouth/Throat: Oropharyngeal exudate  present.  Bleeding spot on the anterior right septum  Eyes: Conjunctivae and EOM are normal.  Neck: Normal range of motion.  Cardiovascular: Normal rate, regular rhythm and normal heart sounds.  Pulmonary/Chest: Effort normal and breath sounds normal.  Musculoskeletal: Normal range of motion.  Neurological: She is alert and oriented to person, place, and time.  Skin: No erythema.  Psychiatric: She has a normal mood and affect. Her behavior is normal. Judgment and thought content normal.    Results for orders placed or performed in visit on 12/19/16  Mononucleosis screen  Result Value Ref Range   Mono Screen Positive (A) Negative  CBC with Differential/Platelet  Result Value Ref Range   WBC 11.4 (H) 3.4 - 10.8 x10E3/uL   RBC 4.34 3.77 - 5.28 x10E6/uL   Hemoglobin 13.1 11.1 - 15.9 g/dL   Hematocrit 39.3 34.0 - 46.6 %   MCV 91 79 - 97 fL   MCH 30.2 26.6 - 33.0 pg   MCHC 33.3 31.5 - 35.7 g/dL   RDW 13.0 12.3 - 15.4 %   Platelets 322 150 - 379 x10E3/uL   Neutrophils 67 Not Estab. %   Lymphs 20 Not Estab. %   Monocytes 9 Not Estab. %   Eos 4 Not Estab. %   Basos 0 Not Estab. %   Neutrophils Absolute 7.6 (H) 1.4 - 7.0 x10E3/uL  Lymphocytes Absolute 2.3 0.7 - 3.1 x10E3/uL   Monocytes Absolute 1.1 (H) 0.1 - 0.9 x10E3/uL   EOS (ABSOLUTE) 0.4 0.0 - 0.4 x10E3/uL   Basophils Absolute 0.1 0.0 - 0.2 x10E3/uL   Immature Granulocytes 0 Not Estab. %   Immature Grans (Abs) 0.0 0.0 - 0.1 x10E3/uL  TSH  Result Value Ref Range   TSH 1.370 0.450 - 4.500 uIU/mL  Gliadin antibodies, serum  Result Value Ref Range   Antigliadin Abs, IgA 5 0 - 19 units   Gliadin IgG 2 0 - 19 units  Tissue transglutaminase, IgA  Result Value Ref Range   Transglutaminase IgA <2 0 - 3 U/mL  Reticulin Antibody, IgA w reflex titer  Result Value Ref Range   Reticulin Ab, IgA Negative Neg:<1:2.5 titer  Lyme Ab/Western Blot Reflex  Result Value Ref Range   Lyme IgG/IgM Ab <0.91 0.00 - 0.90 ISR   LYME DISEASE AB,  QUANT, IGM <0.80 0.00 - 0.79 index  Rocky mtn spotted fvr abs pnl(IgG+IgM)  Result Value Ref Range   RMSF IgG Negative Negative   RMSF IgM 0.65 0.00 - 8.56 index  Ehrlichia Antibody Panel  Result Value Ref Range   E.Chaffeensis (HME) IgG Negative Neg:<1:64   E. Chaffeensis (HME) IgM Titer Negative Neg:<1:20   HGE IgG Titer Negative Neg:<1:64   HGE IgM Titer Negative Neg:<1:20  Babesia microti Antibody Panel  Result Value Ref Range   Babesia microti IgM <1:10 Neg:<1:10   Babesia microti IgG <1:10 Neg:<1:10  Epstein-Barr virus VCA antibody panel  Result Value Ref Range   EBV VCA IgM <36.0 0.0 - 35.9 U/mL   EBV Early Antigen Ab, IgG <9.0 0.0 - 8.9 U/mL   EBV VCA IgG >600.0 (H) 0.0 - 17.9 U/mL   EBV NA IgG <18.0 0.0 - 17.9 U/mL   Interpretation: Comment   CMV IgM  Result Value Ref Range   CMV IgM Ser EIA-aCnc <30.0 0.0 - 29.9 AU/mL  Cmv antibody, IgG (EIA)  Result Value Ref Range   CMV Ab - IgG <0.60 0.00 - 0.59 U/mL      Assessment & Plan:   Problem List Items Addressed This Visit    None    Visit Diagnoses    Acute sinusitis, recurrence not specified, unspecified location    -  Primary   Relevant Medications   amoxicillin-clavulanate (AUGMENTIN) 875-125 MG tablet    Discussed bleeds care and treatment Vaseline ways to treat bleeding. Discussed sinusitis coughing up blood changes and seems to be coming from her lungs need to reevaluate otherwise Allegra and Allegra-D Mucinex Tylenol nasal rinse. Discussed use of Augmentin side effects  Follow up plan: Return in about 4 weeks (around 04/17/2017) for Physical Exam.

## 2017-03-25 ENCOUNTER — Ambulatory Visit: Payer: BLUE CROSS/BLUE SHIELD | Admitting: Obstetrics & Gynecology

## 2017-03-25 ENCOUNTER — Encounter: Payer: Self-pay | Admitting: Obstetrics & Gynecology

## 2017-03-25 VITALS — BP 120/80 | HR 83 | Ht 66.0 in | Wt 153.0 lb

## 2017-03-25 DIAGNOSIS — R635 Abnormal weight gain: Secondary | ICD-10-CM

## 2017-03-25 DIAGNOSIS — R5383 Other fatigue: Secondary | ICD-10-CM

## 2017-03-25 NOTE — Patient Instructions (Signed)
Labs today

## 2017-03-25 NOTE — Progress Notes (Signed)
  HPI:  Patient is a 37 y.o. [id number] presenting for evaluation of abnormal weight gain.  The patient has gained 15+ pounds over the past year. Especially since hysterectomy last year.  Kept ovaries, and denies menopausal sx's.  She feels she has gained weight due to problems with her nutrition and physical activity.  She has these associated symptoms: fatigue.  She has tried self-directed dieting and exercise in the past with little success.  No recent illness or med changes.  PMHx: She  has a past medical history of ADD (attention deficit disorder), Anemia, Anxiety, Asthma, Headache, History of kidney stones, and Irregular menses. Also,  has a past surgical history that includes Tubal ligation (12/24/2003); Laparoscopic hysterectomy (N/A, 07/24/2016); Laparoscopic bilateral salpingectomy (Bilateral, 07/24/2016); Cystoscopy (07/24/2016); and Abdominal hysterectomy., family history includes Endometriosis in her sister; Heart disease in her maternal grandmother; Hypertension in her maternal grandmother and mother; Lung cancer in her maternal grandfather; Stroke in her maternal grandmother.,  reports that she has been smoking e-cigarettes.  she has never used smokeless tobacco. She reports that she does not drink alcohol or use drugs.  She has a current medication list which includes the following prescription(s): albuterol, albuterol, ranitidine, amoxicillin-clavulanate, breo ellipta, and ranitidine. Also, is allergic to contrast media [iodinated diagnostic agents] and omnipaque [iohexol].  Review of Systems  Constitutional: Negative for chills, fever and malaise/fatigue.  HENT: Negative for congestion, sinus pain and sore throat.   Eyes: Negative for blurred vision and pain.  Respiratory: Negative for cough and wheezing.   Cardiovascular: Negative for chest pain and leg swelling.  Gastrointestinal: Negative for abdominal pain, constipation, diarrhea, heartburn, nausea and vomiting.  Genitourinary: Negative  for dysuria, frequency, hematuria and urgency.  Musculoskeletal: Negative for back pain, joint pain, myalgias and neck pain.  Skin: Negative for itching and rash.  Neurological: Negative for dizziness, tremors and weakness.  Endo/Heme/Allergies: Does not bruise/bleed easily.  Psychiatric/Behavioral: Negative for depression. The patient is not nervous/anxious and does not have insomnia.    Objective: BP 120/80   Pulse 83   Ht 5\' 6"  (1.676 m)   Wt 153 lb (69.4 kg)   LMP 07/13/2016 (Exact Date)   BMI 24.69 kg/m  Physical Exam  Constitutional: She is oriented to person, place, and time. She appears well-developed and well-nourished. No distress.  Musculoskeletal: Normal range of motion.  Neurological: She is alert and oriented to person, place, and time.  Skin: Skin is warm and dry.  Psychiatric: She has a normal mood and affect.  Vitals reviewed.  ASSESSMENT:   1. Abnormal weight gain 2. Fatigue, unspecified type These are new and different concerns for the patient. Work up to include: - T3, free - T4, free - TSH - Comprehensive metabolic panel - CBC With Differential - FSH/LH  Plan: Will assist patient in incorporating positive experiences into her life to promote a positive mental attitude.  Education given regarding appropriate lifestyle changes for weight loss, including regular physical activity, healthy coping strategies, caloric restriction, and healthy eating patterns.  Labs to assess for other etiology to her abnormal weight gain. No Rx meds indicated at this time.  A total of 15 minutes were spent face-to-face with the patient during this encounter and over half of that time dealt with counseling and coordination of care.  Barnett Applebaum, MD, [facility], [facility] 03/25/2017  10:46 AM

## 2017-03-26 LAB — TSH: TSH: 1.71 u[IU]/mL (ref 0.450–4.500)

## 2017-03-26 LAB — COMPREHENSIVE METABOLIC PANEL
ALBUMIN: 4.1 g/dL (ref 3.5–5.5)
ALT: 13 IU/L (ref 0–32)
AST: 14 IU/L (ref 0–40)
Albumin/Globulin Ratio: 1.6 (ref 1.2–2.2)
Alkaline Phosphatase: 82 IU/L (ref 39–117)
BUN / CREAT RATIO: 11 (ref 9–23)
BUN: 8 mg/dL (ref 6–20)
Bilirubin Total: 0.3 mg/dL (ref 0.0–1.2)
CHLORIDE: 103 mmol/L (ref 96–106)
CO2: 22 mmol/L (ref 20–29)
CREATININE: 0.76 mg/dL (ref 0.57–1.00)
Calcium: 9 mg/dL (ref 8.7–10.2)
GFR calc non Af Amer: 101 mL/min/{1.73_m2} (ref >59)
GFR, EST AFRICAN AMERICAN: 116 mL/min/{1.73_m2} (ref >59)
GLUCOSE: 76 mg/dL (ref 65–99)
Globulin, Total: 2.6 g/dL (ref 1.5–4.5)
Potassium: 3.9 mmol/L (ref 3.5–5.2)
Sodium: 141 mmol/L (ref 134–144)
TOTAL PROTEIN: 6.7 g/dL (ref 6.0–8.5)

## 2017-03-26 LAB — CBC WITH DIFFERENTIAL
BASOS: 1 %
Basophils Absolute: 0 10*3/uL (ref 0.0–0.2)
EOS (ABSOLUTE): 0.3 10*3/uL (ref 0.0–0.4)
EOS: 4 %
HEMATOCRIT: 38.8 % (ref 34.0–46.6)
HEMOGLOBIN: 12.5 g/dL (ref 11.1–15.9)
IMMATURE GRANULOCYTES: 0 %
Immature Grans (Abs): 0 10*3/uL (ref 0.0–0.1)
Lymphocytes Absolute: 1.9 10*3/uL (ref 0.7–3.1)
Lymphs: 24 %
MCH: 28.8 pg (ref 26.6–33.0)
MCHC: 32.2 g/dL (ref 31.5–35.7)
MCV: 89 fL (ref 79–97)
MONOCYTES: 6 %
MONOS ABS: 0.5 10*3/uL (ref 0.1–0.9)
Neutrophils Absolute: 5.4 10*3/uL (ref 1.4–7.0)
Neutrophils: 65 %
RBC: 4.34 x10E6/uL (ref 3.77–5.28)
RDW: 13.4 % (ref 12.3–15.4)
WBC: 8.2 10*3/uL (ref 3.4–10.8)

## 2017-03-26 LAB — T4, FREE: Free T4: 1.13 ng/dL (ref 0.82–1.77)

## 2017-03-26 LAB — FSH/LH
FSH: 6 m[IU]/mL
LH: 8.1 m[IU]/mL

## 2017-03-26 LAB — T3, FREE: T3, Free: 2.9 pg/mL (ref 2.0–4.4)

## 2017-04-01 ENCOUNTER — Encounter: Payer: Self-pay | Admitting: Family Medicine

## 2017-04-03 ENCOUNTER — Ambulatory Visit (INDEPENDENT_AMBULATORY_CARE_PROVIDER_SITE_OTHER): Payer: BLUE CROSS/BLUE SHIELD | Admitting: Neurology

## 2017-04-03 DIAGNOSIS — R102 Pelvic and perineal pain: Secondary | ICD-10-CM

## 2017-04-03 DIAGNOSIS — G4710 Hypersomnia, unspecified: Secondary | ICD-10-CM

## 2017-04-03 DIAGNOSIS — R5382 Chronic fatigue, unspecified: Secondary | ICD-10-CM

## 2017-04-03 DIAGNOSIS — G4719 Other hypersomnia: Secondary | ICD-10-CM

## 2017-04-04 ENCOUNTER — Other Ambulatory Visit: Payer: Self-pay | Admitting: Family Medicine

## 2017-04-04 ENCOUNTER — Other Ambulatory Visit: Payer: Self-pay

## 2017-04-04 ENCOUNTER — Other Ambulatory Visit: Payer: Self-pay | Admitting: Neurology

## 2017-04-04 ENCOUNTER — Ambulatory Visit (INDEPENDENT_AMBULATORY_CARE_PROVIDER_SITE_OTHER): Payer: BLUE CROSS/BLUE SHIELD | Admitting: Neurology

## 2017-04-04 DIAGNOSIS — G47419 Narcolepsy without cataplexy: Secondary | ICD-10-CM

## 2017-04-04 DIAGNOSIS — G4710 Hypersomnia, unspecified: Secondary | ICD-10-CM

## 2017-04-04 DIAGNOSIS — G4719 Other hypersomnia: Secondary | ICD-10-CM

## 2017-04-04 DIAGNOSIS — R5382 Chronic fatigue, unspecified: Secondary | ICD-10-CM

## 2017-04-04 DIAGNOSIS — R102 Pelvic and perineal pain: Secondary | ICD-10-CM

## 2017-04-04 NOTE — Addendum Note (Signed)
Addended by: Clarene Critchley on: 04/04/2017 01:17 PM   Modules accepted: Orders

## 2017-04-04 NOTE — Addendum Note (Signed)
Addended by: Darleen Crocker on: 04/04/2017 01:17 PM   Modules accepted: Orders

## 2017-04-05 NOTE — Telephone Encounter (Signed)
Flexeril refill Last OV: 03/20/17 Last Refill:01/24/17 Pharmacy: [facility] [place] Medication not on medication list. Thanks.

## 2017-04-10 ENCOUNTER — Encounter: Payer: Self-pay | Admitting: Neurology

## 2017-04-10 LAB — COMPREHENSIVE DRUG ANALYSIS,UR

## 2017-04-14 NOTE — Procedures (Signed)
  Name:  Sheila Benton, Sheila Benton  Reference [id number]  Study Date: 04/04/2017 Procedure #: 1705  DOB: Jan 30, 1981    Protocol for MSLT  This is a 13 channel Multiple Sleep Latency Test comprised of 5 channels of EEG (T3-Cz, Cz-T4, F4-M1, C4-M1, O2-M1), 3 channels of Chin EMG, 4 channels of EOG and 1 channel for ECG.   All channels were sampled at 256 hz.    This polysomnographic procedure is designed to evaluate (1) the complaint of excessive daytime sleepiness by quantifying the time required to fall asleep and (2) the possibility of narcolepsy by checking for abnormally short latencies to REM sleep.  Electrographic variables include EEG, EMG, EOG and ECG.  Patients are monitored throughout four or five 20-minute opportunities to sleep (naps) at two-hour intervals.  For each nap, the patient is allowed 20 minutes to fall asleep.  Once asleep, the patient is awakened after 15 minutes.  Between naps, the patient is kept as alert as possible.  A sleep latency of 20 minutes indicates that no sleep occurred.  Parametric Analysis  Total Number of Naps 4     NAP # Time of Nap  Sleep Latency (mins) REM Latency (mins) Sleep Time Percent Awake Time Percent  1 07:03 1 0 94 6   2 09:01 0.5 0 94  6   3 10:59 4 8.5 78  23   4 13:00 3 6 79  21       MSLT Summary of Naps  Sleepiness Index: 89.4  Epworth Sleepiness Score 18/24, Fatigue S Score 56 /63 points.   Mean Sleep Latency to First Four Naps: 2.1  Mean Sleep Latency to First Three Naps: 1.8  Mean Sleep Latency to First Two Naps: 0.8  Number of Naps with REM Sleep: 2.0     Results from Preceding PSG Study  Sleep Onset Time 21:15 Sleep Efficiency (%) 90.1  Rise Time 05:26 Sleep Latency (min) 31.5  Total Sleep Time  7.9hrs REM Latency (min) 102    I attest to having reviewed every epoch of the entire raw data recording prior to the issuance of this report in accordance with the Standards of the American Academy of Sleep Medicine.     IMPRESSION:  This excessively daytime sleepy patient with a high degree of fatigue and vivid dreams underwent a MSLT.   1. This multiple sleep latency test reveals a mean sleep latency of 2.1 minutes with 2 sleep periods during which REM sleep was recorded.  2.  A total of 4 sleep periods in 4 nap opportunities were recorded.   3. This study was preceded by an overnight polysomnogram with a total sleep time (TST) of 471 minutes.       Name:  [name], Nabor Reference #:  [id number]  Study Date: 04/04/2017 DOB: 06/04/1980    RECOMMENDATIONS: This MSLT study is abnormal. The mean sleep latency is pathologically short at 2.1 minutes and 2 out of 4 naps had onset of REM sleep, with a REM latency of 7.25 minutes. This study is diagnostic for narcolepsy.      Larey Seat, M.D.    04-13-2017  Diplomat, American Board of Psychiatry and Neurology  Diplomat, [place] of Sleep Medicine Medical Director, [place] [facility]

## 2017-04-14 NOTE — Procedures (Signed)
PATIENT'S NAME:  Sheila Benton, Sheila Benton DOB:      1981/01/25      MR#:    [id number]     DATE OF RECORDING: 04/03/2017 REFERRING M.D.:  Park Liter, D.O. Study Performed:   Baseline Polysomnogram HISTORY: Sheila Benton is a 37 year old Caucasian mother of 3, right-handed, living with 3 dogs.  She reports having been sleepier than her peers all her life, never having difficulties to sleep in any situation or location and usually feeling that she sleeps a little too much.  She does have asthma since childhood and respiratory as well as some skin allergies.  Occasionally she will be short of breath, coughing and wheezing but this is not a nightly occurrence and she does not feel that it explains her nonrestorative non-refreshing sleep.   She also suffers from anxiety and fatigue, status post hysterectomy on 24 Jul 2016.     Chief complaint according to patient :" I am just so sleepy"- ' I am anxious but not depressed "   She is promptly asleep, she reports vivid dreams sometimes several times at night, and sometimes the story continues in " installments".  She has never experienced sleep paralysis, she is not sure that she dreams during short naps, and reports no dream intrusion or hypnagogic hallucination.  She naps almost every day -often inadvertently, she falls asleep in different situations if she is not physically active or mentally stimulated.  She always gets 8 hours of nocturnal sleep if not more.  Excessive daytime sleepiness, Anxiety, Asthma, and Headaches. The patient endorsed the Epworth Sleepiness Scale at 19/24 points.   The patient's weight 147 pounds with a height of 66 (inches), resulting in a BMI of 23.7 kg/m2. The patient's neck circumference measured 14.5 inches.  CURRENT MEDICATIONS: Proventil, Celexa, Breo-Ellipta, Zantac.   PROCEDURE:  This is a multichannel digital polysomnogram utilizing the Somnostar 11.2 system.  Electrodes and sensors were applied and monitored per AASM  Specifications.   EEG, EOG, Chin and Limb EMG, were sampled at 200 Hz.  ECG, Snore and Nasal Pressure, Thermal Airflow, Respiratory Effort, CPAP Flow and Pressure, Oximetry was sampled at 50 Hz. Digital video and audio were recorded.      BASELINE STUDY  Lights Out was at 20:44 and Lights On at 05:26.  Total recording time (TRT) was 522.5 minutes, with a total sleep time (TST) of 471 minutes.   The patient's sleep latency was 31.5 minutes.  REM latency was 102 minutes.  The sleep efficiency was 90.1%.     SLEEP ARCHITECTURE: WASO (Wake after sleep onset) was 19.5 minutes.  There were 15.5 minutes in Stage N1, 236.5 minutes Stage N2, 92 minutes Stage N3 and 127 minutes in Stage REM.  The percentage of Stage N1 was 3.2%, Stage N2 was 50.2%, Stage N3 was 19.5% and Stage R (REM sleep) was 27.1%.   The arousals were noted as: 52 were spontaneous, 2 were associated with PLMs, and only 6 were associated with respiratory events.  RESPIRATORY ANALYSIS:  There were a total of 21 respiratory events:  2 obstructive apneas, 1 central apnea and 18 hypopneas with 0 respiratory event related arousals (RERAs).     The total APNEA/HYPOPNEA INDEX (AHI) was 2.7/hour and the total RESPIRATORY DISTURBANCE INDEX was 2.7 /hour.  9 events occurred in REM sleep and 19 events in NREM. The REM AHI was 4.3 /hour, versus a non-REM AHI of 2.1. The patient spent 220 minutes of total sleep time in the supine position and 251  minutes in non-supine. The supine AHI was 3.5 versus a non-supine AHI of 1.9.  OXYGEN SATURATION & C02:  Wake baseline 02 saturation was 97%, with the lowest being 91%. Time spent below 89% saturation equaled 0 minutes.  Total sleep time with CO2 greater than 40 torr was minutes.    PERIODIC LIMB MOVEMENTS:  The patient had a total of 12 Periodic Limb Movements.  The Periodic Limb Movement (PLM) index was 1.5 and the PLM Arousal index was 0.3/hour. Audio and video analysis did not show any abnormal or unusual  movements, behaviors, phonations or vocalizations. No nocturia and only mild snoring. EKG was in keeping with normal sinus rhythm (NSR).  Post-study, the patient indicated that sleep was less sound than usual.   IMPRESSION:  1. Normal sleep architecture and high sleep efficiency, valid for MSLT to follow.   RECOMMENDATIONS: MSLT  1. A follow up appointment will be scheduled in the Sleep Clinic at [facility]. The referring provider will be notified of the results.      I certify that I have reviewed the entire raw data recording prior to the issuance of this report in accordance with the Standards of Accreditation of the American Academy of Sleep Medicine (AASM)     Larey Seat, MD      04-13-2016  Diplomat, American Board of Psychiatry and Neurology  Diplomat, American Board of [place] Director, [facility] [employer]

## 2017-04-15 ENCOUNTER — Encounter: Payer: Self-pay | Admitting: Family Medicine

## 2017-04-15 DIAGNOSIS — G47419 Narcolepsy without cataplexy: Secondary | ICD-10-CM | POA: Insufficient documentation

## 2017-04-16 ENCOUNTER — Telehealth: Payer: Self-pay | Admitting: Neurology

## 2017-04-16 NOTE — Telephone Encounter (Signed)
Called to discuss the sleep study results with the patient. The patients sleep study was indicative of a diagnosis of narcolepsy. I was able to work the patient in for a apt to discuss her sleep study results and what the treatment course will be. Pt verbalized understanding. Pt had no questions at this time but was encouraged to call back if questions arise.

## 2017-04-18 ENCOUNTER — Encounter: Payer: Self-pay | Admitting: Neurology

## 2017-04-18 ENCOUNTER — Ambulatory Visit: Payer: BLUE CROSS/BLUE SHIELD | Admitting: Neurology

## 2017-04-18 VITALS — BP 110/73 | HR 84 | Ht 66.0 in | Wt 154.0 lb

## 2017-04-18 DIAGNOSIS — G47419 Narcolepsy without cataplexy: Secondary | ICD-10-CM

## 2017-04-18 MED ORDER — ARMODAFINIL 250 MG PO TABS: 250.0000 mg | ORAL_TABLET | Freq: Every day | ORAL | 5 refills | Status: DC

## 2017-04-18 NOTE — Patient Instructions (Addendum)
You have a condition called narcolepsy: This means, that you have a sleep disorder that manifests with at times severe excessive sleepiness during the day and often with problems with sleep at night. We may have to try different medications that may help you stay awake during the day. Not everything works with everybody the same way. Wake promoting agents include stimulants and non-stimulant type medications. The most common side effects with stimulants are weight loss, insomnia, nervousness, headaches, palpitations, rise in blood pressure, anxiety. Stimulants can be addictive and subject to abuse. Non-stimulant type wake promoting medications include Provigil and Nuvigil, most common side effects include headaches, nervousness, insomnia, hypertension. In addition there is a medication called Xyrem which has been proven to be very effective in patients with narcolepsy with or without cataplexy. Some patients with narcolepsy report episodes of weakness, such as jaw or facial weakness, legs giving out, feeling wobbly or like "Jell-o", etc. in situations of anxiety, stress, laughter, sudden sadness, surprise, etc., which is called cataplexy. You can also experience episodes of sleep paralysis during which you may feel unable to move upon awakening. Some people experience dreamlike sequences upon awakening or upon drifting off to sleep, called hypnopompic or hypnagogic hallucinations.    Armodafinil tablets What is this medicine? ARMODAFINIL (ar moe DAF i nil) is used to treat excessive sleepiness caused by certain sleep disorders. This includes narcolepsy, sleep apnea, and shift work sleep disorder. This medicine may be used for other purposes; ask your health care provider or pharmacist if you have questions. COMMON BRAND NAME(S): Nuvigil What should I tell my health care provider before I take this medicine? They need to know if you have any of these conditions: -bipolar disorder -depression -drug or  alcohol abuse or addiction -heart disease -high blood pressure -kidney disease -liver disease -schizophrenia -suicidal thoughts, plans, or attempt; a previous suicide attempt by you or a family member -an unusual or allergic reaction to armodafinil, modafinil, medicines, foods, dyes, or preservatives -pregnant or trying to get pregnant -breast-feeding How should I use this medicine? Take this medicine by mouth with a glass of water. Follow the directions on the prescription label. Take your doses at regular intervals. Do not take your medicine more often than directed. Do not stop taking this medicine suddenly except upon the advice of your doctor. Stopping this medicine too quickly may cause serious side effects or your condition may worsen. A special MedGuide will be given to you by the pharmacist with each prescription and refill. Be sure to read this information carefully each time. Talk to your pediatrician regarding the use of this medicine in children. While this drug may be prescribed for children as young as 81 years of age for selected conditions, precautions do apply. Overdosage: If you think you have taken too much of this medicine contact a poison control center or emergency room at once. NOTE: This medicine is only for you. Do not share this medicine with others. What if I miss a dose? If you miss a dose, take it as soon as you can. If it is almost time for your next dose, take only that dose. Do not take double or extra doses. What may interact with this medicine? Do not take this medicine with any of the following medications: -amphetamine or dextroamphetamine -dexmethylphenidate or methylphenidate -MAOIs like Carbex, Eldepryl, Marplan, Nardil, and Parnate -pemoline -procarbazine This medicine may also interact with the following medications: -antifungal medicines like itraconazole or ketoconazole -barbiturates, like phenobarbital -birth control pills  or other  hormone-containing birth control devices or implants -carbamazepine -cyclosporine -diazepam -medicines for depression, anxiety, or psychotic disturbances -phenytoin -propranolol -triazolam -warfarin This list may not describe all possible interactions. Give your health care provider a list of all the medicines, herbs, non-prescription drugs, or dietary supplements you use. Also tell them if you smoke, drink alcohol, or use illegal drugs. Some items may interact with your medicine. What should I watch for while using this medicine? Visit your doctor or health care professional for regular checks on your progress. The full effect of this medicine may not be seen right away. This medicine may affect your concentration, function, or may hide signs that you are tired. You may get dizzy. This medicine will not eliminate your abnormal tendency to fall asleep and is not a replacement for sleep. Do not change your previous behavior regarding potentially dangerous activities, such as driving, using machinery, or doing anything that needs mental alertness until you know how this drug affects you. Alcohol can make you more dizzy and may interfere with your response to this medicine or your alertness. Avoid alcoholic drinks. Birth control pills may not work properly while you are taking this medicine. While using birth control pills, you will need an additional barrier method or an alternative non-hormonal method of birth control during treatment with armodafinil and for 1 month after stopping armodafinil. Talk to your doctor about which extra method of birth control is right for you. It is unknown if the effects of this medicine will be increased by the use of caffeine. Caffeine is found in many foods, beverages, and medications. Ask your doctor if you should limit or change your intake of caffeine-containing products while on this medicine. Do not stop previously prescribed treatments for your condition, such as  a CPAP machine, except on the advise of your physician or health care professional. What side effects may I notice from receiving this medicine? Side effects that you should report to your doctor or health care professional as soon as possible: -allergic reactions like skin rash, itching or hives, swelling of the face, lips, or tongue -anxiety -breathing or swallowing problems -chest pain -depressed mood -elevated mood, decreased need for sleep, racing thoughts, impulsive behavior -fast, irregular heartbeat -fever with rash, swollen lymph nodes, or swelling of the face -hallucination, loss of contact with reality -increased blood pressure -mouth sores, blisters, or peeling skin -sore throat, fever, or chills -suicidal thoughts or other mood changes -tremors -vomiting Side effects that usually do not require medical attention (report to your doctor or health care professional if they continue or are bothersome): -headache -nausea, diarrhea, or stomach upset -nervousness -trouble sleeping This list may not describe all possible side effects. Call your doctor for medical advice about side effects. You may report side effects to FDA at 1-800-FDA-1088. Where should I keep my medicine? Keep out of the reach of children. Store at room temperature between 20 and 25 degrees C (68 and 77 degrees F). Throw away any unused medicine after the expiration date. NOTE: This sheet is a summary. It may not cover all possible information. If you have questions about this medicine, talk to your doctor, pharmacist, or health care provider.  2018 Elsevier/Gold Standard (2015-07-22 18:16:30)  Narcolepsy Narcolepsy is a neurological disorder that causes you to fall asleep suddenly, and without control, during the daytime (sleep attacks). Narcolepsy is a lifelong (chronic) disorder. Normally, sleep follows a regular cycle over the course of the night. After about 90 minutes of light  sleep, your sleep should  become deeper. When your sleep becomes deeper, your body moves less and you start dreaming. This type of deep sleep is called rapid eye movement (REM) sleep. When you have narcolepsy, your REM sleep is not well-regulated. This disrupts your sleep cycle, which causes daytime sleepiness. What are the causes? The cause of narcolepsy is not fully understood, but it may be related to:  Low levels of hypocretin, a chemical (neurotransmitter) in the brain that controls sleep and wake cycles. Hypocretin imbalance may be caused by: ? Abnormal genes that are passed from parent to child (inherited). ? The body's defense system (immune system) attacking hypocretin brain cells (autoimmune disease).  Infection, tumor, or injury in the area of the brain that controls sleep.  Exposure to poisons (toxins), such as heavy metals, pesticides, and secondhand smoke.  What are the signs or symptoms? Symptoms of this condition include:  Excessive daytime sleepiness. This is the most common symptom and is usually the first symptom you will notice. This may affect your performance at work or school.  Sleep attacks. This means falling asleep suddenly and without control. You may fall asleep in the middle of an activity, especially low-energy activities like reading or watching TV.  Feeling like you cannot think clearly.  Trouble focusing or remembering things.  Feeling depressed.  Sudden muscle weakness (cataplexy). When this occurs, your speech may become slurred, or your knees may buckle. Cataplexy is usually triggered by surprise, anger, fear, or laughter.  Loss of the ability to speak or move (sleep paralysis). This may occur just as you start to fall asleep or wake up. You will be aware of the paralysis. It usually lasts for just a few seconds or minutes.  Seeing, hearing, tasting, smelling, or feeling things that are not real (hallucinations). Hallucinations may occur with sleep paralysis. They can happen  when you are falling asleep, waking up, or dozing.  Trouble staying asleep at night (insomnia).  Restless sleep.  How is this diagnosed? This condition may be diagnosed based on:  A physical exam to rule out any other problems that may be causing your symptoms.You may be asked to write down your sleeping patterns for several weeks in a sleep diary. This will help your health care provider make a diagnosis.  Sleep studies that measure how well your REM sleep is regulated. These tests also measure your heart rate, breathing, movement, and brain waves. These tests include: ? An overnight sleep study (polysomnogram). ? A daytime sleep study that is done while you take several naps during the day (multiple sleep latency test, MSLT). This test measures how quickly you fall asleep and how quickly you enter REM sleep.  Removal of spinal fluid to measure hypocretin levels.  How is this treated? There is no cure for this condition, but treatment can help relieve symptoms. Treatment may include:  Lifestyle and sleeping strategies to help you cope with the condition, such as: ? Exercising regularly. ? Maintaining a regular sleep schedule. ? Avoiding caffeine and large meals before bed.  Medicines. These may include: ? Medicines that help keep you awake and alert (stimulants) to fight daytime sleepiness. ? Medicines that treat depression (antidepressants). These may be used to treat cataplexy. ? Sodium oxybate. This is a strong medicine to help you relax (sedative) that you may take at night. It can help control daytime sleepiness and cataplexy.  Follow these instructions at home: Sleeping habits  Get about 8 hours of sleep every night.  Go to sleep and get up at about the same time every day.  Keep your bedroom dark, quiet, and comfortable.  When you feel very tired, take short naps. Schedule naps so that you take them at about the same time every day.  Tell your employer or teachers  that you have narcolepsy. You may be able to adjust your schedule to include time for naps.  Before bedtime: ? Avoid bright lights and screens. ? Relax. Try activities like reading or taking a warm bath. Activity  Get at least 20 minutes of exercise every day. This will help you sleep better at night and reduce daytime sleepiness.  Avoid exercising within 3 hours of bedtime.  If you are sleepy, do not drive or use heavy machinery.  If possible, take a nap before driving.  Do not swim or go out on the water without a life jacket. Eating and drinking  Do not drink alcohol or caffeinated beverages within 4-5 hours of bedtime.  Do not eat a lot of food before bedtime. Eat meals at about the same times every day. General instructions  Take over-the-counter and prescription medicines only as told by your health care provider.  If directed, keep a sleep diary.  Tell your employer or teachers that you have narcolepsy. You may be able to adjust your schedule to include time for naps.  Do not use any products that contain nicotine or tobacco, such as cigarettes and e-cigarettes. If you need help quitting, ask your health care provider.  Keep all follow-up visits as told by your health care provider. This is important. Contact a health care provider if:  Your symptoms are not getting better.  You have increasingly high blood pressure (hypertension).  You have changes in your heart rhythm.  You are having a hard time determining what is real and what is not (psychosis). Get help right away if:  You hurt yourself during a sleep attack or an attack of cataplexy.  You have chest pain.  You have trouble breathing. This information is not intended to replace advice given to you by your health care provider. Make sure you discuss any questions you have with your health care provider. Document Released: 02/02/2002 Document Revised: 02/06/2016 Document Reviewed: 02/06/2016 Elsevier  Interactive Patient Education  2018 [place] information:   As discussed, Xyrem has to be taken with very mindful caution: Taking Xyrem correctly is key. This means, take it only when you are fully ready to fall asleep, while in bed and refrain from doing any other activities, even brushing  your teeth after taking your first dose. The second dose will be about 2-1/2-4 hours after his first dose. You can go to the bathroom before your 2nd dose. Take your first dose, when actually IN BED, ready to sleep. No sitting up in bed, NO reading, NO using the cell phone or computer, NO getting up to use the bathroom. Take care of everything BEFORE sleep time. Try NOT to skip the second dose as the Xyrem is not going to stay in your system long enough with only one dose. Do not drink alcohol with Xyrem. If you do drink Alcohol, you cannot take your Xyrem doses that night.

## 2017-04-18 NOTE — Progress Notes (Signed)
SLEEP MEDICINE CLINIC   Provider:  Larey Seat, M D  Primary Care Physician:  Valerie Roys, DO Referring Provider: Valerie Roys, DO   Chief Complaint  Patient presents with  . Follow-up    pt here with husband, rm 74. here to discuss the sleept study results and treatment plan    HPI:  Sheila Benton is a 37 y.o. female , seen here as in a revisit after sleep study-  Dr. Wynetta Emery   I have the pleasure of meeting today on 18 April 2017 with Mr. and Sheila Benton.  Sheila Benton underwent a sleep study on 03 April 2017 first a baseline polysomnography based on her concern of excessive daytime sleepiness.  She had endorsed the Epworth Sleepiness Scale at 19 out of 24 points.  The sleep study showed negligible amount of apnea, no periodic limb movements no abnormality in heart rate or oxygen saturation.  An M SLT test followed the following day on 04 April 2017.  The patient had 2 REM sleep onsets and for naps.  Her mean sleep latency over 4 naps was 2.1 minutes.  REM sleep latency was 7.25 minutes.  This M SLT is diagnostic for the condition of narcolepsy.  We are now meeting to see how to best treat Mrs. Sheila Benton today's Epworth score was endorsed at 17 out of 24 points there is still a high degree of fatigue he was 52 out of 59 points endorsed on the fatigue severity score. She is pathologically sleepy today, after a night time sleep of over 9 hours.  She recently had a bout of migraines- 3-4 since last Friday. Has sinusitis and rhinitis.    Sheila Benton is a 37 year old Caucasian mother of 3, right-handed, living with 3 dogs.  She reports having been sleepier than her peers all her life, never having difficulties to sleep in any situation, location and usually feeling that she sleeps a little too much.  She does have asthma since childhood and respiratory as well as some skin allergies.  Occasionally she will be short of breath, coughing and wheezing but this is not a nightly  occurrence and she does not feel that it explains her nonrestorative non-refreshing sleep.  She also suffers from anxiety and fatigue, status post hysterectomy on 24 Jul 2016 and tubal ligation on 01 December 2003.  Chief complaint according to patient :" I am just so sleepy"- ' I am anxious but not depressed "  Sleep habits are as follows: She usually goes to bed between 10 and 11 PM and is asleep promptly.  Her bedroom is conducive to sleep, quiet, dark ,cool. She wakes up 2-3 times at night and usually goes to the bathroom, but it is not the urge to urinate that wakes her in the first place.  She has a bedroom with her husband and 2 dogs.  She is a side sleeper and sleeps on 2 pillows, but has a flat mattress and is not adjustable. Her husband has been snoring, but has not reported or witnessed her to do the same.  She considers her sleep deep and sound, she is not easily aroused.  She worries a lot but it does not keep her from going to sleep. After each bathroom break she is promptly asleep again, she reports vivid dreams sometimes several times at night, and sometimes the story continues in different installments.  She has never experienced sleep paralysis, she is not sure that she dreams during short naps, and  reports no dream intrusion or hypnagogic hallucination. She wakes up in the morning around 7 AM but by that time her husband's alarm has already rang several times.  She does not feel necessarily refreshed but she does not feel an energy surge in the morning either.  She naps almost every day -often inadvertently, she falls asleep in different situations if she is not physically active or mentally stimulated.  She always gets 8 hours of nocturnal sleep if not more.  Sleep medical history and family sleep history: she also has a family history of cystic fibrosis and diabetes affecting 2 nieces have cystic fibrosis, 2 maternal uncles had cystic fibrosis.  Both parents are alcoholics.  Maternal  history of diabetes in  mother and maternal grandmother. Grandfather maternal had lung cancer, maternal grandmother had thyroid cancer.  Social history: She quit smoking cigarettes over 4 years ago but she does use a vapor. ETOH- none, and caffeine -  Mountain dew - 2 a day, I cup of coffee in AM, no teas, no energy drinks.    Review of Systems: Out of a complete 14 system review, the patient complains of only the following symptoms, and all other reviewed systems are negative.  Sinusitis,, rhinitis, stuffy nose, allergies.    How likely are you to doze in the following situations: 0 = not likely, 1 = slight chance, 2 = moderate chance, 3 = high chance  Sitting and Reading? 3 Watching Television? 2 Sitting inactive in a public place (theater or meeting)? 3 Lying down in the afternoon when circumstances permit?3 Sitting and talking to someone?2 Sitting quietly after lunch without alcohol?1 In a car, while stopped for a few minutes in traffic?2 As a passenger in a car for an hour without a break?1  Total =17 !!!!   , Fatigue severity score 56  , depression score 8 on PHQ12  Social History   Socioeconomic History  . Marital status: Married    Spouse name: Not on file  . Number of children: Not on file  . Years of education: Not on file  . Highest education level: Not on file  Social Needs  . Financial resource strain: Not on file  . Food insecurity - worry: Not on file  . Food insecurity - inability: Not on file  . Transportation needs - medical: Not on file  . Transportation needs - non-medical: Not on file  Occupational History  . Not on file  Tobacco Use  . Smoking status: Current Every Day Smoker    Types: E-cigarettes  . Smokeless tobacco: Never Used  Substance and Sexual Activity  . Alcohol use: No  . Drug use: No  . Sexual activity: Yes    Comment: Tubes Tied  Other Topics Concern  . Not on file  Social History Narrative  . Not on file    Family History    Problem Relation Age of Onset  . Hypertension Mother   . Endometriosis Sister   . Heart disease Maternal Grandmother   . Hypertension Maternal Grandmother   . Stroke Maternal Grandmother   . Lung cancer Maternal Grandfather     Past Medical History:  Diagnosis Date  . ADD (attention deficit disorder)   . Anemia   . Anxiety   . Asthma    WELL CONTROLLED  . Headache    MIGRAINES  . History of kidney stones   . Irregular menses     Past Surgical History:  Procedure Laterality Date  . ABDOMINAL HYSTERECTOMY    .  CYSTOSCOPY  07/24/2016   Procedure: CYSTOSCOPY;  Surgeon: Gae Dry, MD;  Location: [facility];  Service: Gynecology;;  . LAPAROSCOPIC BILATERAL SALPINGECTOMY Bilateral 07/24/2016   Procedure: LAPAROSCOPIC BILATERAL SALPINGECTOMY;  Surgeon: Gae Dry, MD;  Location: [facility];  Service: Gynecology;  Laterality: Bilateral;  . LAPAROSCOPIC HYSTERECTOMY N/A 07/24/2016   Procedure: HYSTERECTOMY TOTAL LAPAROSCOPIC;  Surgeon: Gae Dry, MD;  Location: [facility];  Service: Gynecology;  Laterality: N/A;  . TUBAL LIGATION  12/24/2003    Current Outpatient Medications  Medication Sig Dispense Refill  . albuterol (PROVENTIL HFA;VENTOLIN HFA) 108 (90 Base) MCG/ACT inhaler Inhale 1-2 puffs every 6 (six) hours as needed into the lungs for wheezing or shortness of breath. 1 Inhaler 0  . albuterol (PROVENTIL) (2.5 MG/3ML) 0.083% nebulizer solution VVN Q 6 H PRF WHZ OR SOB  3  . BREO ELLIPTA 200-25 MCG/INH AEPB INHALE 1 PUFF INTO THE LUNGS DAILY 60 each 12  . cyclobenzaprine (FLEXERIL) 10 MG tablet TAKE 1 TABLET(10 MG) BY MOUTH THREE TIMES DAILY AS NEEDED FOR MUSCLE SPASMS 30 tablet 0  . PROAIR HFA 108 (90 Base) MCG/ACT inhaler INHALE 1 PUFF INTO THE LUNGS EVERY 6 HOURS AS NEEDED FOR WHEEZING OR SHORTNESS OF BREATH 8.5 g 0  . ranitidine (ZANTAC) 150 MG tablet Take 1 tablet (150 mg total) at bedtime by mouth. 30 tablet 3   No current facility-administered medications for  this visit.     Allergies as of 04/18/2017 - Review Complete 04/18/2017  Allergen Reaction Noted  . Contrast media [iodinated diagnostic agents] Rash 01/07/2017  . Omnipaque [iohexol] Itching 04/26/2015    Vitals: BP 110/73   Pulse 84   Ht 5\' 6"  (1.676 m)   Wt 154 lb (69.9 kg)   LMP 07/13/2016 (Exact Date)   BMI 24.86 kg/m  Last Weight:  Wt Readings from Last 1 Encounters:  04/18/17 154 lb (69.9 kg)   OIB:BCWU mass index is 24.86 kg/m.     Last Height:   Ht Readings from Last 1 Encounters:  04/18/17 5\' 6"  (1.676 m)    Physical exam:  General: The patient is awake, alert and appears not in acute distress. The patient is well groomed. Head: Normocephalic, atraumatic. Neck is supple. Mallampati 3 neck circumference:14.5 . Nasal airflow congested. Retrognathia is not seen.  Cardiovascular:  Regular rate and rhythm, without  murmurs or carotid bruit, and without distended neck veins. Respiratory: Lungs are clear to auscultation. Skin:  Without evidence of edema, or rash-  Trunk: BMI is 24. The patient's posture is erect  Neurologic exam : The patient is awake and alert, oriented to place and time.   Memory subjective described as intact.  Attention span & concentration ability appears normal.  She  is alert. Speech is fluent,  without dysarthria, dysphonia or aphasia.  Mood and affect are appropriate.  Cranial nerves: Pupils are equal and briskly reactive to light. Funduscopic exam without evidence of pallor or edema. Extraocular movements  in vertical and horizontal planes intact and without nystagmus. Visual fields by finger perimetry are intact.Hearing to finger rub intact.  Facial sensation intact to fine touch. Facial motor strength is symmetric and tongue and uvula move midline. Shoulder shrug is symmetrical.   Motor exam:   Normal tone, muscle bulk and symmetric strength in all extremities. Sensory:  Fine touch, pinprick and vibration were tested in all extremities.  Proprioception tested in the upper extremities was normal. Coordination: Rapid alternating movements in the fingers/hands was normal. Finger-to-nose maneuver  normal without evidence of ataxia, dysmetria or tremor. Gait and station: Patient walks without assistive device -Turns with 3 Steps. Romberg testing is negative.  Deep tendon reflexes: in the  upper and lower extremities are brisk,  symmetric and intact. Babinski maneuver response is  downgoing.   I was able to review Dr. Bard Herbert provided information, the patient's medication list, which includes as needed Tussionex, Antivert, Deltasone, prescribed for asthma attack and upper respiratory viral infection. GERD. She suspected to have ADHD.   She also has Zantac, Metamucil, the Brio Ellipta inhaler, Celexa 40 mg daily, BuSpar 5 mg 3 times daily.  Tessalon, and Proventil inhaler as needed.  She describes chronic fatigue with severe duration as well as excessive daytime sleepiness with an Epworth score of 19 points, she has no problems with vital signs, her blood pressure is normal and I reviewed her CBC and differential which looked exactly as it should be.  TSH was normal, she is negative for bleeding antibodies, she just recently was tested for Lyme disease, which was negative, Babesia negative, Epstein-Barr negative, CMV negative.  Assessment:  After physical and neurologic examination, review of laboratory studies,  Personal review of imaging studies, reports of other /same  Imaging studies, results of polysomnography and / or neurophysiology testing and pre-existing records as far as provided in visit., my assessment is   1) I am quite fascinated with Mrs. Modesitt is lifelong history of being excessively daytime sleepy, often struggling with staying awake and daytime but without symptoms associated with narcolepsy or cataplexy.  In order to have a valid PSG and M SLT the patient had weaned off Celexa and actually feels better off the medication  now.  She sporadically uses Flexeril for his lower back pain, and sometimes she would feel sleepy after taking the Flexeril.  She is not a caffeine abuser.  Her PSG indicated a very normal sleep architecture, followed by an M SLT which documented clear narcolepsy was to sleep REM onsets and for naps.  Mean sleep latency was 2.4 minutes.!  We are discussing now how to best treat Mrs. Mikkelsen, I would like to start with modafinil and see if she is feeling functional during daytime fatigue would be reduced as well as her daytime sleepiness.  She did advise me that she recently had some sinusitis and/or migraines over the last week or so.  I do not think that modafinil will contribute to headaches but it is known to contribute to headaches if taken with caffeine.  I will start her on Nuvigil 250 mg.  The medication comes as a caplet and can be split in half should she feel jittery nervous or anxious after taking it.  She is allowed to reduce the dose and just call me to inform me.  She does not have to wait for my permission.  If modafinil does not cover her excessive daytime sleepiness we may have to go to Xyrem.  I gave the couple today a brief introduction into the medicine and I will also give her a handout that describes what Xyrem is and how to take it.  Xyrem is not for everybody but she does not have small children anymore, she does not live alone and she does not abuse substances.  All this would make her a prime candidate to proceed to Xyrem if modafinil fails.  I spent more than 40 minutes of face to face time with the patient.  Greater than 50% of time was spent in counseling and  coordination of care. We have discussed the diagnosis and differential and I answered the patient's questions.       Larey Seat, MD 6/83/7290, 2:11 AM  Certified in Neurology by ABPN Certified in [place] by [facility] [address] [place], [place]  [address]

## 2017-04-19 ENCOUNTER — Telehealth: Payer: Self-pay | Admitting: *Deleted

## 2017-04-19 DIAGNOSIS — Z0289 Encounter for other administrative examinations: Secondary | ICD-10-CM

## 2017-04-19 NOTE — Telephone Encounter (Signed)
Pt FMLA form on [employer].

## 2017-04-24 ENCOUNTER — Encounter: Payer: BLUE CROSS/BLUE SHIELD | Admitting: Family Medicine

## 2017-04-25 NOTE — Telephone Encounter (Signed)
Pt FMLA form faxed to [facility] @ [phone]

## 2017-05-06 ENCOUNTER — Ambulatory Visit (INDEPENDENT_AMBULATORY_CARE_PROVIDER_SITE_OTHER): Payer: BLUE CROSS/BLUE SHIELD | Admitting: Family Medicine

## 2017-05-06 ENCOUNTER — Encounter: Payer: Self-pay | Admitting: Family Medicine

## 2017-05-06 VITALS — BP 109/74 | HR 71 | Temp 98.5°F | Ht 66.2 in | Wt 150.2 lb

## 2017-05-06 DIAGNOSIS — M546 Pain in thoracic spine: Secondary | ICD-10-CM | POA: Diagnosis not present

## 2017-05-06 DIAGNOSIS — Z0001 Encounter for general adult medical examination with abnormal findings: Secondary | ICD-10-CM

## 2017-05-06 DIAGNOSIS — Z0000 Encounter for general adult medical examination without abnormal findings: Secondary | ICD-10-CM

## 2017-05-06 DIAGNOSIS — F411 Generalized anxiety disorder: Secondary | ICD-10-CM

## 2017-05-06 DIAGNOSIS — J4541 Moderate persistent asthma with (acute) exacerbation: Secondary | ICD-10-CM | POA: Diagnosis not present

## 2017-05-06 LAB — UA/M W/RFLX CULTURE, ROUTINE
BILIRUBIN UA: NEGATIVE
Glucose, UA: NEGATIVE
Ketones, UA: NEGATIVE
LEUKOCYTES UA: NEGATIVE
Nitrite, UA: NEGATIVE
PH UA: 5.5 (ref 5.0–7.5)
PROTEIN UA: NEGATIVE
RBC, UA: NEGATIVE
Specific Gravity, UA: >1.03 — ABNORMAL HIGH (ref 1.005–1.030)
Urobilinogen, Ur: 0.2 mg/dL (ref 0.2–1.0)

## 2017-05-06 MED ORDER — FLUTICASONE FUROATE-VILANTEROL 200-25 MCG/INH IN AEPB: 1.0000 | INHALATION_SPRAY | Freq: Every day | RESPIRATORY_TRACT | 12 refills | Status: DC

## 2017-05-06 MED ORDER — RANITIDINE HCL 150 MG PO TABS: 150.0000 mg | ORAL_TABLET | Freq: Every day | ORAL | 3 refills | Status: DC

## 2017-05-06 MED ORDER — ALBUTEROL SULFATE HFA 108 (90 BASE) MCG/ACT IN AERS: 1.0000 | INHALATION_SPRAY | Freq: Four times a day (QID) | RESPIRATORY_TRACT | 0 refills | Status: DC | PRN

## 2017-05-06 NOTE — Assessment & Plan Note (Signed)
Under good control. Continue to monitor. Call with any concerns. Refills given.  

## 2017-05-06 NOTE — Assessment & Plan Note (Signed)
Under good control off medicine. Continue to monitor.

## 2017-05-06 NOTE — Progress Notes (Signed)
BP 109/74 (BP Location: Left Arm, Patient Position: Sitting, Cuff Size: Normal)   Pulse 71   Temp 98.5 F (36.9 C)   Ht 5' 6.2" (1.681 m)   Wt 150 lb 3 oz (68.1 kg)   LMP 07/13/2016 (Exact Date)   SpO2 99%   BMI 24.09 kg/m    Subjective:    Patient ID: Sheila Benton, female    DOB: 1980-08-12, 37 y.o.   MRN: [id number]  HPI: Sheila Benton is a 37 y.o. female presenting on 05/06/2017 for comprehensive medical examination. Current medical complaints include: recently diagnosed with narcolepsy. Not feeling tired. Has been feeling really nauseous and dizzy with the armodafinil. Has been trying different things like drinking water.  Had to come off her muscle relaxor due to the sleep study. Upper back is hurting a lot. Feeling really tight. Burning pain with it too. No radiation. Has been worse for 2-3 weeks.   She currently lives with: husband and kids Menopausal Symptoms: no  Depression Screen done today and results listed below:  Depression screen [facility] 2/9 05/06/2017 03/20/2017 12/19/2016 11/06/2016 06/14/2016  Decreased Interest 0 1 2 0 2  Down, Depressed, Hopeless 0 0 0 0 1  PHQ - 2 Score 0 1 2 0 3  Altered sleeping 1 - 3 3 3   Tired, decreased energy 1 - 3 3 3   Change in appetite 0 - 0 3 0  Feeling bad or failure about yourself  0 - 0 0 0  Trouble concentrating 0 - 3 3 0  Moving slowly or fidgety/restless 0 - 0 0 0  Suicidal thoughts 0 - 0 0 0  PHQ-9 Score 2 - 11 12 9   Difficult doing work/chores Not difficult at all - Somewhat difficult - -    Past Medical History:  Past Medical History:  Diagnosis Date  . ADD (attention deficit disorder)   . Anemia   . Anxiety   . Asthma    WELL CONTROLLED  . Headache    MIGRAINES  . History of kidney stones   . Irregular menses     Surgical History:  Past Surgical History:  Procedure Laterality Date  . ABDOMINAL HYSTERECTOMY    . CYSTOSCOPY  07/24/2016   Procedure: CYSTOSCOPY;  Surgeon: Gae Dry, MD;  Location: [facility] ORS;   Service: Gynecology;;  . LAPAROSCOPIC BILATERAL SALPINGECTOMY Bilateral 07/24/2016   Procedure: LAPAROSCOPIC BILATERAL SALPINGECTOMY;  Surgeon: Gae Dry, MD;  Location: [facility];  Service: Gynecology;  Laterality: Bilateral;  . LAPAROSCOPIC HYSTERECTOMY N/A 07/24/2016   Procedure: HYSTERECTOMY TOTAL LAPAROSCOPIC;  Surgeon: Gae Dry, MD;  Location: [facility];  Service: Gynecology;  Laterality: N/A;  . TUBAL LIGATION  12/24/2003    Medications:  Current Outpatient Medications on File Prior to Visit  Medication Sig  . albuterol (PROVENTIL) (2.5 MG/3ML) 0.083% nebulizer solution VVN Q 6 H PRF WHZ OR SOB  . Armodafinil 250 MG tablet Take 1 tablet (250 mg total) by mouth daily.   No current facility-administered medications on file prior to visit.     Allergies:  Allergies  Allergen Reactions  . Contrast Media [Iodinated Diagnostic Agents] Rash  . Omnipaque [Iohexol] Itching    Patient approximately 5 minutes after injection sneezed once and then started itching uncontrollably all over. SPM    Social History:  Social History   Socioeconomic History  . Marital status: Married    Spouse name: Not on file  . Number of children: Not on file  . Years of  education: Not on file  . Highest education level: Not on file  Social Needs  . Financial resource strain: Not on file  . Food insecurity - worry: Not on file  . Food insecurity - inability: Not on file  . Transportation needs - medical: Not on file  . Transportation needs - non-medical: Not on file  Occupational History  . Not on file  Tobacco Use  . Smoking status: Former Research scientist (life sciences)  . Smokeless tobacco: Never Used  Substance and Sexual Activity  . Alcohol use: No  . Drug use: No  . Sexual activity: Yes    Comment: Tubes Tied  Other Topics Concern  . Not on file  Social History Narrative  . Not on file   Social History   Tobacco Use  Smoking Status Former Smoker  Smokeless Tobacco Never Used   Social  History   Substance and Sexual Activity  Alcohol Use No    Family History:  Family History  Problem Relation Age of Onset  . Hypertension Mother   . Endometriosis Sister   . Heart disease Maternal Grandmother   . Hypertension Maternal Grandmother   . Stroke Maternal Grandmother   . Lung cancer Maternal Grandfather     Past medical history, surgical history, medications, allergies, family history and social history reviewed with patient today and changes made to appropriate areas of the chart.   Review of Systems  Constitutional: Negative.   HENT: Negative.   Eyes: Negative.   Respiratory: Negative.   Cardiovascular: Negative.   Gastrointestinal: Positive for heartburn and nausea. Negative for abdominal pain, blood in stool, constipation, diarrhea, melena and vomiting.  Genitourinary: Negative.   Musculoskeletal: Positive for back pain and myalgias. Negative for falls, joint pain and neck pain.  Skin: Negative.   Neurological: Positive for dizziness. Negative for tingling, tremors, sensory change, speech change, focal weakness, seizures, loss of consciousness and headaches.  Endo/Heme/Allergies: Positive for environmental allergies. Negative for polydipsia. Does not bruise/bleed easily.  Psychiatric/Behavioral: Negative.     All other ROS negative except what is listed above and in the HPI.      Objective:    BP 109/74 (BP Location: Left Arm, Patient Position: Sitting, Cuff Size: Normal)   Pulse 71   Temp 98.5 F (36.9 C)   Ht 5' 6.2" (1.681 m)   Wt 150 lb 3 oz (68.1 kg)   LMP 07/13/2016 (Exact Date)   SpO2 99%   BMI 24.09 kg/m   Wt Readings from Last 3 Encounters:  05/06/17 150 lb 3 oz (68.1 kg)  04/18/17 154 lb (69.9 kg)  03/25/17 153 lb (69.4 kg)    Physical Exam  Constitutional: She is oriented to person, place, and time. She appears well-developed and well-nourished. No distress.  HENT:  Head: Normocephalic and atraumatic.  Right Ear: Hearing, tympanic  membrane, external ear and ear canal normal.  Left Ear: Hearing, tympanic membrane, external ear and ear canal normal.  Nose: Nose normal.  Mouth/Throat: Uvula is midline, oropharynx is clear and moist and mucous membranes are normal. No oropharyngeal exudate.  Eyes: Conjunctivae, EOM and lids are normal. Pupils are equal, round, and reactive to light. Right eye exhibits no discharge. Left eye exhibits no discharge. No scleral icterus.  Neck: Normal range of motion. Neck supple. No JVD present. No tracheal deviation present. No thyromegaly present.  Cardiovascular: Normal rate, regular rhythm, normal heart sounds and intact distal pulses. Exam reveals no gallop and no friction rub.  No murmur heard. Pulmonary/Chest: Effort  normal and breath sounds normal. No stridor. No respiratory distress. She has no wheezes. She has no rales. She exhibits no tenderness. Right breast exhibits no inverted nipple, no mass, no nipple discharge, no skin change and no tenderness. Left breast exhibits no inverted nipple, no mass, no nipple discharge, no skin change and no tenderness. Breasts are symmetrical.  Abdominal: Soft. Bowel sounds are normal. She exhibits no distension and no mass. There is no tenderness. There is no rebound and no guarding.  Genitourinary:  Genitourinary Comments: Pelvic exam deferred with shared decision making  Musculoskeletal: She exhibits no edema, tenderness or deformity.  Tenderness and decreased ROM of upper back.  Lymphadenopathy:    She has no cervical adenopathy.  Neurological: She is alert and oriented to person, place, and time. She has normal reflexes. She displays normal reflexes. No cranial nerve deficit. She exhibits normal muscle tone. Coordination normal.  Skin: Skin is warm, dry and intact. No rash noted. She is not diaphoretic. No erythema. No pallor.  Psychiatric: She has a normal mood and affect. Her speech is normal and behavior is normal. Judgment and thought content  normal. Cognition and memory are normal.  Nursing note and vitals reviewed.   Results for orders placed or performed in visit on 03/25/17  T3, free  Result Value Ref Range   T3, Free 2.9 2.0 - 4.4 pg/mL  T4, free  Result Value Ref Range   Free T4 1.13 0.82 - 1.77 ng/dL  TSH  Result Value Ref Range   TSH 1.710 0.450 - 4.500 uIU/mL  Comprehensive metabolic panel  Result Value Ref Range   Glucose 76 65 - 99 mg/dL   BUN 8 6 - 20 mg/dL   Creatinine, Ser 0.76 0.57 - 1.00 mg/dL   GFR calc non Af Amer 101 >59 mL/min/1.73   GFR calc Af Amer 116 >59 mL/min/1.73   BUN/Creatinine Ratio 11 9 - 23   Sodium 141 134 - 144 mmol/L   Potassium 3.9 3.5 - 5.2 mmol/L   Chloride 103 96 - 106 mmol/L   CO2 22 20 - 29 mmol/L   Calcium 9.0 8.7 - 10.2 mg/dL   Total Protein 6.7 6.0 - 8.5 g/dL   Albumin 4.1 3.5 - 5.5 g/dL   Globulin, Total 2.6 1.5 - 4.5 g/dL   Albumin/Globulin Ratio 1.6 1.2 - 2.2   Bilirubin Total 0.3 0.0 - 1.2 mg/dL   Alkaline Phosphatase 82 39 - 117 IU/L   AST 14 0 - 40 IU/L   ALT 13 0 - 32 IU/L  CBC With Differential  Result Value Ref Range   WBC 8.2 3.4 - 10.8 x10E3/uL   RBC 4.34 3.77 - 5.28 x10E6/uL   Hemoglobin 12.5 11.1 - 15.9 g/dL   Hematocrit 38.8 34.0 - 46.6 %   MCV 89 79 - 97 fL   MCH 28.8 26.6 - 33.0 pg   MCHC 32.2 31.5 - 35.7 g/dL   RDW 13.4 12.3 - 15.4 %   Neutrophils 65 Not Estab. %   Lymphs 24 Not Estab. %   Monocytes 6 Not Estab. %   Eos 4 Not Estab. %   Basos 1 Not Estab. %   Neutrophils Absolute 5.4 1.4 - 7.0 x10E3/uL   Lymphocytes Absolute 1.9 0.7 - 3.1 x10E3/uL   Monocytes Absolute 0.5 0.1 - 0.9 x10E3/uL   EOS (ABSOLUTE) 0.3 0.0 - 0.4 x10E3/uL   Basophils Absolute 0.0 0.0 - 0.2 x10E3/uL   Immature Granulocytes 0 Not Estab. %  Immature Grans (Abs) 0.0 0.0 - 0.1 x10E3/uL  FSH/LH  Result Value Ref Range   LH 8.1 mIU/mL   FSH 6.0 mIU/mL      Assessment & Plan:   Problem List Items Addressed This Visit      Respiratory   Asthma    Under good  control. Continue to monitor. Call with any concerns. Refills given.       Relevant Medications   albuterol (PROVENTIL HFA;VENTOLIN HFA) 108 (90 Base) MCG/ACT inhaler   fluticasone furoate-vilanterol (BREO ELLIPTA) 200-25 MCG/INH AEPB     Other   Anxiety disorder    Under good control off medicine. Continue to monitor.        Other Visit Diagnoses    Routine general medical examination at a health care facility    -  Primary   Vaccines declined. Screening labs checked today. Pap N/A. Continue diet and exercise. Call with any concerns.    Relevant Orders   CBC with Differential/Platelet   Comprehensive metabolic panel   Lipid Panel w/o Chol/HDL Ratio   TSH   UA/M w/rflx Culture, Routine   Acute bilateral thoracic back pain       Cannot take muscle relaxers due to narcolepsy. Will obtain thoracic x-ray and get into PT. Call with any concerns.    Relevant Orders   DG Thoracic Spine W/Swimmers       Follow up plan: Return in about 1 year (around 05/07/2018) for Physical.   LABORATORY TESTING:  - Pap smear: not applicable  IMMUNIZATIONS:   - Tdap: Tetanus vaccination status reviewed: Refused. - Influenza: Refused  PATIENT COUNSELING:   Advised to take 1 mg of folate supplement per day if capable of pregnancy.   Sexuality: Discussed sexually transmitted diseases, partner selection, use of condoms, avoidance of unintended pregnancy  and contraceptive alternatives.   Advised to avoid cigarette smoking.  I discussed with the patient that most people either abstain from alcohol or drink within safe limits (<=14/week and <=4 drinks/occasion for males, <=7/weeks and <= 3 drinks/occasion for females) and that the risk for alcohol disorders and other health effects rises proportionally with the number of drinks per week and how often a drinker exceeds daily limits.  Discussed cessation/primary prevention of drug use and availability of treatment for abuse.   Diet: Encouraged to  adjust caloric intake to maintain  or achieve ideal body weight, to reduce intake of dietary saturated fat and total fat, to limit sodium intake by avoiding high sodium foods and not adding table salt, and to maintain adequate dietary potassium and calcium preferably from fresh fruits, vegetables, and low-fat dairy products.    stressed the importance of regular exercise  Injury prevention: Discussed safety belts, safety helmets, smoke detector, smoking near bedding or upholstery.   Dental health: Discussed importance of regular tooth brushing, flossing, and dental visits.    NEXT PREVENTATIVE PHYSICAL DUE IN 1 YEAR. Return in about 1 year (around 05/07/2018) for Physical.

## 2017-05-06 NOTE — Patient Instructions (Addendum)
Health Maintenance, Female Adopting a healthy lifestyle and getting preventive care can go a long way to promote health and wellness. Talk with your health care provider about what schedule of regular examinations is right for you. This is a good chance for you to check in with your provider about disease prevention and staying healthy. In between checkups, there are plenty of things you can do on your own. Experts have done a lot of research about which lifestyle changes and preventive measures are most likely to keep you healthy. Ask your health care provider for more information. Weight and diet Eat a healthy diet  Be sure to include plenty of vegetables, fruits, low-fat dairy products, and lean protein.  Do not eat a lot of foods high in solid fats, added sugars, or salt.  Get regular exercise. This is one of the most important things you can do for your health. ? Most adults should exercise for at least 150 minutes each week. The exercise should increase your heart rate and make you sweat (moderate-intensity exercise). ? Most adults should also do strengthening exercises at least twice a week. This is in addition to the moderate-intensity exercise.  Maintain a healthy weight  Body mass index (BMI) is a measurement that can be used to identify possible weight problems. It estimates body fat based on height and weight. Your health care provider can help determine your BMI and help you achieve or maintain a healthy weight.  For females 20 years of age and older: ? A BMI below 18.5 is considered underweight. ? A BMI of 18.5 to 24.9 is normal. ? A BMI of 25 to 29.9 is considered overweight. ? A BMI of 30 and above is considered obese.  Watch levels of cholesterol and blood lipids  You should start having your blood tested for lipids and cholesterol at 38 years of age, then have this test every 5 years.  You may need to have your cholesterol levels checked more often if: ? Your lipid or  cholesterol levels are high. ? You are older than 38 years of age. ? You are at high risk for heart disease.  Cancer screening Lung Cancer  Lung cancer screening is recommended for adults 55-80 years old who are at high risk for lung cancer because of a history of smoking.  A yearly low-dose CT scan of the lungs is recommended for people who: ? Currently smoke. ? Have quit within the past 15 years. ? Have at least a 30-pack-year history of smoking. A pack year is smoking an average of one pack of cigarettes a day for 1 year.  Yearly screening should continue until it has been 15 years since you quit.  Yearly screening should stop if you develop a health problem that would prevent you from having lung cancer treatment.  Breast Cancer  Practice breast self-awareness. This means understanding how your breasts normally appear and feel.  It also means doing regular breast self-exams. Let your health care provider know about any changes, no matter how small.  If you are in your 20s or 30s, you should have a clinical breast exam (CBE) by a health care provider every 1-3 years as part of a regular health exam.  If you are 40 or older, have a CBE every year. Also consider having a breast X-ray (mammogram) every year.  If you have a family history of breast cancer, talk to your health care provider about genetic screening.  If you are at high risk   for breast cancer, talk to your health care provider about having an MRI and a mammogram every year.  Breast cancer gene (BRCA) assessment is recommended for women who have family members with BRCA-related cancers. BRCA-related cancers include: ? Breast. ? Ovarian. ? Tubal. ? Peritoneal cancers.  Results of the assessment will determine the need for genetic counseling and BRCA1 and BRCA2 testing.  Cervical Cancer Your health care provider may recommend that you be screened regularly for cancer of the pelvic organs (ovaries, uterus, and  vagina). This screening involves a pelvic examination, including checking for microscopic changes to the surface of your cervix (Pap test). You may be encouraged to have this screening done every 3 years, beginning at age 22.  For women ages 56-65, health care providers may recommend pelvic exams and Pap testing every 3 years, or they may recommend the Pap and pelvic exam, combined with testing for human papilloma virus (HPV), every 5 years. Some types of HPV increase your risk of cervical cancer. Testing for HPV may also be done on women of any age with unclear Pap test results.  Other health care providers may not recommend any screening for nonpregnant women who are considered low risk for pelvic cancer and who do not have symptoms. Ask your health care provider if a screening pelvic exam is right for you.  If you have had past treatment for cervical cancer or a condition that could lead to cancer, you need Pap tests and screening for cancer for at least 20 years after your treatment. If Pap tests have been discontinued, your risk factors (such as having a new sexual partner) need to be reassessed to determine if screening should resume. Some women have medical problems that increase the chance of getting cervical cancer. In these cases, your health care provider may recommend more frequent screening and Pap tests.  Colorectal Cancer  This type of cancer can be detected and often prevented.  Routine colorectal cancer screening usually begins at 38 years of age and continues through 38 years of age.  Your health care provider may recommend screening at an earlier age if you have risk factors for colon cancer.  Your health care provider may also recommend using home test kits to check for hidden blood in the stool.  A small camera at the end of a tube can be used to examine your colon directly (sigmoidoscopy or colonoscopy). This is done to check for the earliest forms of colorectal  cancer.  Routine screening usually begins at age 33.  Direct examination of the colon should be repeated every 5-10 years through 38 years of age. However, you may need to be screened more often if early forms of precancerous polyps or small growths are found.  Skin Cancer  Check your skin from head to toe regularly.  Tell your health care provider about any new moles or changes in moles, especially if there is a change in a mole's shape or color.  Also tell your health care provider if you have a mole that is larger than the size of a pencil eraser.  Always use sunscreen. Apply sunscreen liberally and repeatedly throughout the day.  Protect yourself by wearing long sleeves, pants, a wide-brimmed hat, and sunglasses whenever you are outside.  Heart disease, diabetes, and high blood pressure  High blood pressure causes heart disease and increases the risk of stroke. High blood pressure is more likely to develop in: ? People who have blood pressure in the high end of  the normal range (130-139/85-89 mm Hg). ? People who are overweight or obese. ? People who are African American.  If you are 21-29 years of age, have your blood pressure checked every 3-5 years. If you are 3 years of age or older, have your blood pressure checked every year. You should have your blood pressure measured twice-once when you are at a hospital or clinic, and once when you are not at a hospital or clinic. Record the average of the two measurements. To check your blood pressure when you are not at a hospital or clinic, you can use: ? An automated blood pressure machine at a pharmacy. ? A home blood pressure monitor.  If you are between 17 years and 37 years old, ask your health care provider if you should take aspirin to prevent strokes.  Have regular diabetes screenings. This involves taking a blood sample to check your fasting blood sugar level. ? If you are at a normal weight and have a low risk for diabetes,  have this test once every three years after 38 years of age. ? If you are overweight and have a high risk for diabetes, consider being tested at a younger age or more often. Preventing infection Hepatitis B  If you have a higher risk for hepatitis B, you should be screened for this virus. You are considered at high risk for hepatitis B if: ? You were born in a country where hepatitis B is common. Ask your health care provider which countries are considered high risk. ? Your parents were born in a high-risk country, and you have not been immunized against hepatitis B (hepatitis B vaccine). ? You have HIV or AIDS. ? You use needles to inject street drugs. ? You live with someone who has hepatitis B. ? You have had sex with someone who has hepatitis B. ? You get hemodialysis treatment. ? You take certain medicines for conditions, including cancer, organ transplantation, and autoimmune conditions.  Hepatitis C  Blood testing is recommended for: ? Everyone born from [birth year] through 1966. ? Anyone with known risk factors for hepatitis C.  Sexually transmitted infections (STIs)  You should be screened for sexually transmitted infections (STIs) including gonorrhea and chlamydia if: ? You are sexually active and are younger than 38 years of age. ? You are older than 38 years of age and your health care provider tells you that you are at risk for this type of infection. ? Your sexual activity has changed since you were last screened and you are at an increased risk for chlamydia or gonorrhea. Ask your health care provider if you are at risk.  If you do not have HIV, but are at risk, it may be recommended that you take a prescription medicine daily to prevent HIV infection. This is called pre-exposure prophylaxis (PrEP). You are considered at risk if: ? You are sexually active and do not regularly use condoms or know the HIV status of your partner(s). ? You take drugs by injection. ? You are  sexually active with a partner who has HIV.  Talk with your health care provider about whether you are at high risk of being infected with HIV. If you choose to begin PrEP, you should first be tested for HIV. You should then be tested every 3 months for as long as you are taking PrEP. Pregnancy  If you are premenopausal and you may become pregnant, ask your health care provider about preconception counseling.  If you may become  pregnant, take 400 to 800 micrograms (mcg) of folic acid every day.  If you want to prevent pregnancy, talk to your health care provider about birth control (contraception). Osteoporosis and menopause  Osteoporosis is a disease in which the bones lose minerals and strength with aging. This can result in serious bone fractures. Your risk for osteoporosis can be identified using a bone density scan.  If you are 32 years of age or older, or if you are at risk for osteoporosis and fractures, ask your health care provider if you should be screened.  Ask your health care provider whether you should take a calcium or vitamin D supplement to lower your risk for osteoporosis.  Menopause may have certain physical symptoms and risks.  Hormone replacement therapy may reduce some of these symptoms and risks. Talk to your health care provider about whether hormone replacement therapy is right for you. Follow these instructions at home:  Schedule regular health, dental, and eye exams.  Stay current with your immunizations.  Do not use any tobacco products including cigarettes, chewing tobacco, or electronic cigarettes.  If you are pregnant, do not drink alcohol.  If you are breastfeeding, limit how much and how often you drink alcohol.  Limit alcohol intake to no more than 1 drink per day for nonpregnant women. One drink equals 12 ounces of beer, 5 ounces of wine, or 1 ounces of hard liquor.  Do not use street drugs.  Do not share needles.  Ask your health care  provider for help if you need support or information about quitting drugs.  Tell your health care provider if you often feel depressed.  Tell your health care provider if you have ever been abused or do not feel safe at home. This information is not intended to replace advice given to you by your health care provider. Make sure you discuss any questions you have with your health care provider. Document Released: 08/28/2010 Document Revised: 07/21/2015 Document Reviewed: 11/16/2014 Elsevier Interactive Patient Education  2018 [employer].  Thoracic Strain A thoracic strain, which is sometimes called a mid-back strain, is an injury to the muscles or tendons that attach to the upper part of your back behind your chest. This type of injury occurs when a muscle is overstretched or overloaded. Thoracic strains can range from mild to severe. Mild strains may involve stretching a muscle or tendon without tearing it. These injuries may heal in 1-2 weeks. More severe strains involve tearing of muscle fibers or tendons. These will cause more pain and may take 6-8 weeks to heal. What are the causes? This condition may be caused by:  An injury in which a sudden force is placed on the muscle.  Exercising without properly warming up.  Overuse of the muscle.  Improper form during certain movements.  Other injuries that surround or cause stress on the mid-back, causing a strain on the muscles.  In some cases, the cause may not be known. What increases the risk? This injury is more common in:  Athletes.  People with obesity.  What are the signs or symptoms? The main symptom of this condition is pain, especially with movement. Other symptoms include:  Bruising.  Swelling.  Spasm.  How is this diagnosed? This condition may be diagnosed with a physical exam. X-rays may be taken to check for a fracture. How is this treated? This condition may be treated with:  Resting and icing the injured  area.  Physical therapy. This will involve doing stretching  and strengthening exercises.  Medicines for pain and inflammation.  Follow these instructions at home:  Rest as needed. Follow instructions from your health care provider about any restrictions on activity.  If directed, apply ice to the injured area: ? Put ice in a plastic bag. ? Place a towel between your skin and the bag. ? Leave the ice on for 20 minutes, 2-3 times per day.  Take over-the-counter and prescription medicines only as told by your health care provider.  Begin doing exercises as told by your health care provider or physical therapist.  Always warm up properly before physical activity or sports.  Bend your knees before you lift heavy objects.  Keep all follow-up visits as told by your health care provider. This is important. Contact a health care provider if:  Your pain is not helped by medicine.  Your pain, bruising, or swelling is getting worse.  You have a fever. Get help right away if:  You have shortness of breath.  You have chest pain.  You develop numbness or weakness in your legs.  You have involuntary loss of urine (urinary incontinence). This information is not intended to replace advice given to you by your health care provider. Make sure you discuss any questions you have with your health care provider. Document Released: 05/05/2003 Document Revised: 10/15/2015 Document Reviewed: 04/08/2014 Elsevier Interactive Patient Education  [employer].

## 2017-05-07 LAB — COMPREHENSIVE METABOLIC PANEL
A/G RATIO: 1.8 (ref 1.2–2.2)
ALT: 8 IU/L (ref 0–32)
AST: 14 IU/L (ref 0–40)
Albumin: 4.1 g/dL (ref 3.5–5.5)
Alkaline Phosphatase: 70 IU/L (ref 39–117)
BUN/Creatinine Ratio: 11 (ref 9–23)
BUN: 7 mg/dL (ref 6–20)
Bilirubin Total: <0.2 mg/dL (ref 0.0–1.2)
CALCIUM: 9.1 mg/dL (ref 8.7–10.2)
CO2: 23 mmol/L (ref 20–29)
Chloride: 104 mmol/L (ref 96–106)
Creatinine, Ser: 0.61 mg/dL (ref 0.57–1.00)
GFR calc Af Amer: 133 mL/min/{1.73_m2} (ref >59)
GFR, EST NON AFRICAN AMERICAN: 115 mL/min/{1.73_m2} (ref >59)
GLOBULIN, TOTAL: 2.3 g/dL (ref 1.5–4.5)
Glucose: 72 mg/dL (ref 65–99)
POTASSIUM: 3.6 mmol/L (ref 3.5–5.2)
SODIUM: 140 mmol/L (ref 134–144)
Total Protein: 6.4 g/dL (ref 6.0–8.5)

## 2017-05-07 LAB — CBC WITH DIFFERENTIAL/PLATELET
BASOS: 1 %
Basophils Absolute: 0.1 10*3/uL (ref 0.0–0.2)
EOS (ABSOLUTE): 0.4 10*3/uL (ref 0.0–0.4)
EOS: 6 %
HEMATOCRIT: 38.7 % (ref 34.0–46.6)
Hemoglobin: 12.1 g/dL (ref 11.1–15.9)
IMMATURE GRANS (ABS): 0 10*3/uL (ref 0.0–0.1)
IMMATURE GRANULOCYTES: 0 %
LYMPHS: 32 %
Lymphocytes Absolute: 2.3 10*3/uL (ref 0.7–3.1)
MCH: 28.5 pg (ref 26.6–33.0)
MCHC: 31.3 g/dL — ABNORMAL LOW (ref 31.5–35.7)
MCV: 91 fL (ref 79–97)
Monocytes Absolute: 0.6 10*3/uL (ref 0.1–0.9)
Monocytes: 9 %
NEUTROS PCT: 52 %
Neutrophils Absolute: 3.9 10*3/uL (ref 1.4–7.0)
Platelets: 348 10*3/uL (ref 150–379)
RBC: 4.25 x10E6/uL (ref 3.77–5.28)
RDW: 13.4 % (ref 12.3–15.4)
WBC: 7.3 10*3/uL (ref 3.4–10.8)

## 2017-05-07 LAB — LIPID PANEL W/O CHOL/HDL RATIO
Cholesterol, Total: 156 mg/dL (ref 100–199)
HDL: 42 mg/dL (ref >39)
LDL Calculated: 94 mg/dL (ref 0–99)
Triglycerides: 100 mg/dL (ref 0–149)
VLDL Cholesterol Cal: 20 mg/dL (ref 5–40)

## 2017-05-07 LAB — TSH: TSH: 2.35 u[IU]/mL (ref 0.450–4.500)

## 2017-06-16 ENCOUNTER — Other Ambulatory Visit: Payer: Self-pay | Admitting: Family Medicine

## 2017-07-15 NOTE — Progress Notes (Signed)
[facility]  PATIENT: Sheila Benton DOB: 10-27-1980   REASON FOR VISIT: Follow-up for narcolepsy HISTORY FROM: Patient and husband Sheila Benton    HISTORY OF PRESENT ILLNESS:UPDATE 5/21/2019CM [name], 37 year old female returns for follow-up with history of narcolepsy.  When last seen by Dr. Brett Fairy she was placed on Nuvigil 250 mg.  Patient states that her daytime sleepiness is much improved on the medication however it increased her anxiety and she stopped it 1 week ago. She did not try a half dose as  Dr. Brett Fairy had recommended if her anxiety increased ESS score dropped to 8 on the medication.  And fatigue severity index 45.  Both of these are much improved patient returns for reevaluation.  Patient states she has no desire to go on Xyrem.   04/18/17 CDI have the pleasure of meeting today on 18 April 2017 with Mr. and Mrs. Kuhner.  Sheila Benton underwent a sleep study on 03 April 2017 first a baseline polysomnography based on her concern of excessive daytime sleepiness.  She had endorsed the Epworth Sleepiness Scale at 19 out of 24 points.  The sleep study showed negligible amount of apnea, no periodic limb movements no abnormality in heart rate or oxygen saturation.  An M SLT test followed the following day on 04 April 2017.  The patient had 2 REM sleep onsets and for naps.  Her mean sleep latency over 4 naps was 2.1 minutes.  REM sleep latency was 7.25 minutes.  This M SLT is diagnostic for the condition of narcolepsy.  We are now meeting to see how to best treat Mrs. Sheila Benton today's Epworth score was endorsed at 17 out of 24 points there is still a high degree of fatigue he was 52 out of 59 points endorsed on the fatigue severity score. She is pathologically sleepy today, after a night time sleep of over 9 hours.  She recently had a bout of migraines- 3-4 since last Friday. Has sinusitis and rhinitis.    Sheila Benton is a 37 year old Caucasian mother of 3,  right-handed, living with 3 dogs.  She reports having been sleepier than her peers all her life, never having difficulties to sleep in any situation, location and usually feeling that she sleeps a little too much.  She does have asthma since childhood and respiratory as well as some skin allergies.  Occasionally she will be short of breath, coughing and wheezing but this is not a nightly occurrence and she does not feel that it explains her nonrestorative non-refreshing sleep.  She also suffers from anxiety and fatigue, status post hysterectomy on 24 Jul 2016 and tubal ligation on 01 December 2003.  Chief complaint according to patient :" I am just so sleepy"- ' I am anxious but not depressed "  Sleep habits are as follows: She usually goes to bed between 10 and 11 PM and is asleep promptly.  Her bedroom is conducive to sleep, quiet, dark ,cool. She wakes up 2-3 times at night and usually goes to the bathroom, but it is not the urge to urinate that wakes her in the first place.  She has a bedroom with her husband and 2 dogs.  She is a side sleeper and sleeps on 2 pillows, but has a flat mattress and is not adjustable. Her husband has been snoring, but has not reported or witnessed her to do the same.  She considers her sleep deep and sound, she is not easily aroused.  She worries a lot  but it does not keep her from going to sleep. After each bathroom break she is promptly asleep again, she reports vivid dreams sometimes several times at night, and sometimes the story continues in different installments.  She has never experienced sleep paralysis, she is not sure that she dreams during short naps, and reports no dream intrusion or hypnagogic hallucination. She wakes up in the morning around 7 AM but by that time her husband's alarm has already rang several times.  She does not feel necessarily refreshed but she does not feel an energy surge in the morning either.  She naps almost every day -often  inadvertently, she falls asleep in different situations if she is not physically active or mentally stimulated.  She always gets 8 hours of nocturnal sleep if not more.  REVIEW OF SYSTEMS: Full 14 system review of systems performed and notable only for those listed, all others are neg:  Constitutional: neg  Cardiovascular: neg Ear/Nose/Throat: neg  Skin: neg Eyes: neg Respiratory: neg Gastroitestinal: neg  Hematology/Lymphatic: neg  Endocrine: neg Musculoskeletal:neg Allergy/Immunology: neg Neurological: neg Psychiatric: neg Sleep : Narcolepsy   ALLERGIES: Allergies  Allergen Reactions  . Armodafinil Other (See Comments)    "Shoulder ticks, back pain, increased anxiety, keeps me awake"  . Contrast Media [Iodinated Diagnostic Agents] Rash  . Omnipaque [Iohexol] Itching    Patient approximately 5 minutes after injection sneezed once and then started itching uncontrollably all over. SPM    HOME MEDICATIONS: Outpatient Medications Prior to Visit  Medication Sig Dispense Refill  . albuterol (PROVENTIL) (2.5 MG/3ML) 0.083% nebulizer solution VVN Q 6 H PRF WHZ OR SOB  3  . fluticasone furoate-vilanterol (BREO ELLIPTA) 200-25 MCG/INH AEPB Inhale 1 puff into the lungs daily. 60 each 12  . VENTOLIN HFA 108 (90 Base) MCG/ACT inhaler INHALE 1 TO 2 PUFFS INTO THE LUNGS EVERY 6 HOURS AS NEEDED FOR WHEEZING OR SHORTNESS OF BREATH 18 g 0  . ranitidine (ZANTAC) 150 MG tablet Take 1 tablet (150 mg total) by mouth at bedtime. 90 tablet 3  . Armodafinil 250 MG tablet Take 1 tablet (250 mg total) by mouth daily. (Patient not taking: Reported on 07/16/2017) 30 tablet 5  . naproxen (NAPROSYN) 500 MG tablet TAKE 1 TABLET(500 MG) BY MOUTH TWICE DAILY WITH A MEAL (Patient not taking: Reported on 07/16/2017) 60 tablet 0   No facility-administered medications prior to visit.     PAST MEDICAL HISTORY: Past Medical History:  Diagnosis Date  . ADD (attention deficit disorder)   . Anemia   . Anxiety     . Asthma    WELL CONTROLLED  . Headache    MIGRAINES  . History of kidney stones   . Irregular menses     PAST SURGICAL HISTORY: Past Surgical History:  Procedure Laterality Date  . ABDOMINAL HYSTERECTOMY    . CYSTOSCOPY  07/24/2016   Procedure: CYSTOSCOPY;  Surgeon: Gae Dry, MD;  Location: [facility] OR[facility];  Service: Gynecology;;  . LAPAROSCOPIC BILATERAL SALPINGECTOMY Bilateral 07/24/2016   Procedure: LAPAROSCOPIC BILATERAL SALPINGECTOMY;  Surgeon: Gae Dry, MD;  Location: [facility];  Service: Gynecology;  Laterality: Bilateral;  . LAPAROSCOPIC HYSTERECTOMY N/A 07/24/2016   Procedure: HYSTERECTOMY TOTAL LAPAROSCOPIC;  Surgeon: Gae Dry, MD;  Location: [facility];  Service: Gynecology;  Laterality: N/A;  . TUBAL LIGATION  12/24/2003    FAMILY HISTORY: Family History  Problem Relation Age of Onset  . Hypertension Mother   . Endometriosis Sister   . Heart disease Maternal  Grandmother   . Hypertension Maternal Grandmother   . Stroke Maternal Grandmother   . Lung cancer Maternal Grandfather     SOCIAL HISTORY: Social History   Socioeconomic History  . Marital status: Married    Spouse name: Not on file  . Number of children: Not on file  . Years of education: Not on file  . Highest education level: Not on file  Occupational History  . Not on file  Social Needs  . Financial resource strain: Not on file  . Food insecurity:    Worry: Not on file    Inability: Not on file  . Transportation needs:    Medical: Not on file    Non-medical: Not on file  Tobacco Use  . Smoking status: Former Research scientist (life sciences)  . Smokeless tobacco: Never Used  Substance and Sexual Activity  . Alcohol use: No  . Drug use: No  . Sexual activity: Yes    Comment: Tubes Tied  Lifestyle  . Physical activity:    Days per week: Not on file    Minutes per session: Not on file  . Stress: Not on file  Relationships  . Social connections:    Talks on phone: Not on file    Gets together: Not  on file    Attends religious service: Not on file    Active member of club or organization: Not on file    Attends meetings of clubs or organizations: Not on file    Relationship status: Not on file  . Intimate partner violence:    Fear of current or ex partner: Not on file    Emotionally abused: Not on file    Physically abused: Not on file    Forced sexual activity: Not on file  Other Topics Concern  . Not on file  Social History Narrative  . Not on file     PHYSICAL EXAM  Vitals:   07/16/17 0921  BP: 106/65  Pulse: 77  Weight: 141 lb (64 kg)  Height: 5\' 6"  (1.676 m)   Body mass index is 22.76 kg/m.  Generalized: Well developed, in no acute distress  Head: normocephalic and atraumatic,. Oropharynx benign  Neck: Supple,  Musculoskeletal: No deformity   Neurological examination   Mentation: Alert oriented to time, place, history taking. Attention span and concentration appropriate. Recent and remote memory intact.  Follows all commands speech and language fluent.   Cranial nerve II-XII: Pupils were equal round reactive to light extraocular movements were full, visual field were full on confrontational test. Facial sensation and strength were normal. hearing was intact to finger rubbing bilaterally. Uvula tongue midline. head turning and shoulder shrug were normal and symmetric.Tongue protrusion into cheek strength was normal. Motor: normal bulk and tone, full strength in the BUE, BLE,  Sensory: normal and symmetric to light touch, Coordination: finger-nose-finger, heel-to-shin bilaterally, no dysmetria Gait and Station: Rising up from seated position without assistance, normal stance,  moderate stride, good arm swing, smooth turning, able to perform tiptoe, and heel walking without difficulty. Tandem gait is steady  DIAGNOSTIC DATA (LABS, IMAGING, TESTING) - I reviewed patient records, labs, notes, testing and imaging myself where available.  Lab Results  Component  Value Date   WBC 7.3 05/06/2017   HGB 12.1 05/06/2017   HCT 38.7 05/06/2017   MCV 91 05/06/2017   PLT 348 05/06/2017      Component Value Date/Time   NA 140 05/06/2017 0934   NA 136 02/06/2013 2146   K 3.6  05/06/2017 0934   K 3.4 (L) 02/06/2013 2146   CL 104 05/06/2017 0934   CL 105 02/06/2013 2146   CO2 23 05/06/2017 0934   CO2 27 02/06/2013 2146   GLUCOSE 72 05/06/2017 0934   GLUCOSE 103 (H) 02/06/2013 2146   BUN 7 05/06/2017 0934   BUN 10 02/06/2013 2146   CREATININE 0.61 05/06/2017 0934   CREATININE 0.84 02/06/2013 2146   CALCIUM 9.1 05/06/2017 0934   CALCIUM 9.4 02/06/2013 2146   PROT 6.4 05/06/2017 0934   PROT 7.5 02/06/2013 2146   ALBUMIN 4.1 05/06/2017 0934   ALBUMIN 3.8 02/06/2013 2146   AST 14 05/06/2017 0934   AST 16 02/06/2013 2146   ALT 8 05/06/2017 0934   ALT 13 02/06/2013 2146   ALKPHOS 70 05/06/2017 0934   ALKPHOS 94 02/06/2013 2146   BILITOT <0.2 05/06/2017 0934   BILITOT 0.6 02/06/2013 2146   GFRNONAA 115 05/06/2017 0934   GFRNONAA >60 02/06/2013 2146   GFRAA 133 05/06/2017 0934   GFRAA >60 02/06/2013 2146   Lab Results  Component Value Date   CHOL 156 05/06/2017   HDL 42 05/06/2017   LDLCALC 94 05/06/2017   TRIG 100 05/06/2017    Lab Results  Component Value Date   TSH 2.350 05/06/2017      ASSESSMENT AND PLAN  37 y.o. year old female  has a past medical history of ADD (attention deficit disorder), Anemia, Anxiety, Asthma, Headache, History of kidney stones, and Irregular menses.  And narcolepsy here to follow-up for her narcolepsy.  She got anxious on the 250 mg of Nuvigil and stopped taking it about a week ago.  She did not try 1/2 tablet.ESS 8 FSS 45 . Pt is not interested in Xyrem.   PLAN: Decrease Nuvigil to 1/2 tab daily for 1 month then full tab.  Follow up in 3 months Dennie Bible, [facility], [facility], APRN  [facility] [address] [place], [place] [address] [phone]

## 2017-07-16 ENCOUNTER — Ambulatory Visit: Payer: BLUE CROSS/BLUE SHIELD | Admitting: Nurse Practitioner

## 2017-07-16 ENCOUNTER — Encounter: Payer: Self-pay | Admitting: Nurse Practitioner

## 2017-07-16 VITALS — BP 106/65 | HR 77 | Ht 66.0 in | Wt 141.0 lb

## 2017-07-16 DIAGNOSIS — G47419 Narcolepsy without cataplexy: Secondary | ICD-10-CM | POA: Diagnosis not present

## 2017-07-16 NOTE — Patient Instructions (Signed)
Decrease Nuvigil to 1/2 tab daily for 1 month then full tab.  Follow up in 3 months

## 2017-07-19 NOTE — Progress Notes (Signed)
I agree with the assessment and plan as directed by NP .The patient is known to me .   Ronit Marczak, MD  

## 2017-07-26 NOTE — Progress Notes (Signed)
I agree with the assessment and plan as directed by NP .The patient is known to me .   Neda Willenbring, MD  

## 2017-07-26 NOTE — Progress Notes (Signed)
I agree with the assessment and plan as directed by NP .The patient is known to me .   Evonda Enge, MD  

## 2017-08-09 ENCOUNTER — Encounter: Payer: Self-pay | Admitting: Family Medicine

## 2017-08-26 ENCOUNTER — Ambulatory Visit: Payer: BLUE CROSS/BLUE SHIELD | Admitting: Gastroenterology

## 2017-08-26 ENCOUNTER — Ambulatory Visit
Admission: RE | Admit: 2017-08-26 | Discharge: 2017-08-26 | Disposition: A | Discharge status: Home / Self Care | Payer: BLUE CROSS/BLUE SHIELD | Source: Ambulatory Visit | Attending: Gastroenterology | Admitting: Gastroenterology

## 2017-08-26 ENCOUNTER — Other Ambulatory Visit
Admission: RE | Admit: 2017-08-26 | Discharge: 2017-08-26 | Disposition: A | Discharge status: Home / Self Care | Payer: BLUE CROSS/BLUE SHIELD | Source: Ambulatory Visit | Attending: Gastroenterology | Admitting: Gastroenterology

## 2017-08-26 ENCOUNTER — Encounter: Payer: Self-pay | Admitting: Gastroenterology

## 2017-08-26 VITALS — BP 109/69 | HR 82 | Ht 66.0 in | Wt 145.2 lb

## 2017-08-26 DIAGNOSIS — Q898 Other specified congenital malformations: Secondary | ICD-10-CM | POA: Insufficient documentation

## 2017-08-26 DIAGNOSIS — R1084 Generalized abdominal pain: Secondary | ICD-10-CM | POA: Diagnosis not present

## 2017-08-26 DIAGNOSIS — I700 Atherosclerosis of aorta: Secondary | ICD-10-CM | POA: Insufficient documentation

## 2017-08-26 LAB — URINALYSIS, COMPLETE (UACMP) WITH MICROSCOPIC
BACTERIA UA: NONE SEEN
BILIRUBIN URINE: NEGATIVE
Glucose, UA: NEGATIVE mg/dL
Hgb urine dipstick: NEGATIVE
Ketones, ur: NEGATIVE mg/dL
LEUKOCYTES UA: NEGATIVE
NITRITE: NEGATIVE
Protein, ur: NEGATIVE mg/dL
SPECIFIC GRAVITY, URINE: 1.013 (ref 1.005–1.030)
pH: 7 (ref 5.0–8.0)

## 2017-08-26 NOTE — Progress Notes (Signed)
Vonda Antigua, MD [address]  [place]  [place], [place] [address]  Main: [phone]  Fax: [phone]   Primary Care Physician: Valerie Roys, DO  Primary Gastroenterologist:  Dr. Vonda Antigua  Chief Complaint  Patient presents with  . Follow-up    Reflux: coughing, vomiting stomach acid and now food for last 3 weeks.    HPI: Sheila Benton is a 37 y.o. female initially seen in November 2018 due to 2 to 85-month history of abdominal pain and bloating.  Stool H. pylori was ordered that patient did not have done.  We had also started her on Zantac which did not help, so her primary care provider started omeprazole which she started taking about 2 weeks ago.  She states this helped her symptoms somewhat.  However, today she is reporting a new pain, left mid quadrant, sharp, and nonradiating, not associate with any nausea or vomiting, no aggravating or relieving factors.  She reports soft bowel movements daily without straining.  No blood in stool.  Also reports difficulty with urination over the last week.  No hematuria, painful urination, or burning on urination.  Current Outpatient Medications  Medication Sig Dispense Refill  . albuterol (PROVENTIL) (2.5 MG/3ML) 0.083% nebulizer solution VVN Q 6 H PRF WHZ OR SOB  3  . Armodafinil 250 MG tablet Take 1 tablet (250 mg total) by mouth daily. (Patient taking differently: Take 250 mg by mouth daily. Try 1/2 tab for 1 month then increase to full tab) 30 tablet 5  . fluticasone furoate-vilanterol (BREO ELLIPTA) 200-25 MCG/INH AEPB Inhale 1 puff into the lungs daily. 60 each 12  . omeprazole (PRILOSEC) 20 MG capsule Take 20 mg by mouth daily.    . VENTOLIN HFA 108 (90 Base) MCG/ACT inhaler INHALE 1 TO 2 PUFFS INTO THE LUNGS EVERY 6 HOURS AS NEEDED FOR WHEEZING OR SHORTNESS OF BREATH 18 g 0  . naproxen (NAPROSYN) 500 MG tablet TAKE 1 TABLET(500 MG) BY MOUTH TWICE DAILY WITH A MEAL (Patient not taking: Reported on  07/16/2017) 60 tablet 0   No current facility-administered medications for this visit.     Allergies as of 08/26/2017 - Review Complete 08/26/2017  Allergen Reaction Noted  . Armodafinil Other (See Comments) 07/16/2017  . Contrast media [iodinated diagnostic agents] Rash 01/07/2017  . Omnipaque [iohexol] Itching 04/26/2015    ROS:  General: Negative for anorexia, weight loss, fever, chills, fatigue, weakness. ENT: Negative for hoarseness, difficulty swallowing , nasal congestion. CV: Negative for chest pain, angina, palpitations, dyspnea on exertion, peripheral edema.  Respiratory: Negative for dyspnea at rest, dyspnea on exertion, cough, sputum, wheezing.  GI: See history of present illness. GU:  Negative for dysuria, hematuria, urinary incontinence, urinary frequency, nocturnal urination.  Endo: Negative for unusual weight change.    Physical Examination:   BP 109/69   Pulse 82   Ht 5\' 6"  (1.676 m)   Wt 145 lb 3.2 oz (65.9 kg)   LMP 07/13/2016 (Exact Date)   BMI 23.44 kg/m   General: Well-nourished, well-developed in no acute distress.  Eyes: No icterus. Conjunctivae pink. Mouth: Oropharyngeal mucosa moist and pink , no lesions erythema or exudate. Neck: Supple, Trachea midline Abdomen: Bowel sounds are normal, abdomen is soft, no signs of peritonitis, no CVA tenderness, mildly tender to palpation left mid quadrant,, nondistended, no hepatosplenomegaly or masses, no abdominal bruits or hernia , no rebound or guarding.   Extremities: No lower extremity edema. No clubbing or deformities. Neuro: Alert and oriented  x 3.  Grossly intact. Skin: Warm and dry, no jaundice.   Psych: Alert and cooperative, normal mood and affect.   Labs: CMP     Component Value Date/Time   NA 140 05/06/2017 0934   NA 136 02/06/2013 2146   K 3.6 05/06/2017 0934   K 3.4 (L) 02/06/2013 2146   CL 104 05/06/2017 0934   CL 105 02/06/2013 2146   CO2 23 05/06/2017 0934   CO2 27 02/06/2013 2146    GLUCOSE 72 05/06/2017 0934   GLUCOSE 103 (H) 02/06/2013 2146   BUN 7 05/06/2017 0934   BUN 10 02/06/2013 2146   CREATININE 0.61 05/06/2017 0934   CREATININE 0.84 02/06/2013 2146   CALCIUM 9.1 05/06/2017 0934   CALCIUM 9.4 02/06/2013 2146   PROT 6.4 05/06/2017 0934   PROT 7.5 02/06/2013 2146   ALBUMIN 4.1 05/06/2017 0934   ALBUMIN 3.8 02/06/2013 2146   AST 14 05/06/2017 0934   AST 16 02/06/2013 2146   ALT 8 05/06/2017 0934   ALT 13 02/06/2013 2146   ALKPHOS 70 05/06/2017 0934   ALKPHOS 94 02/06/2013 2146   BILITOT <0.2 05/06/2017 0934   BILITOT 0.6 02/06/2013 2146   GFRNONAA 115 05/06/2017 0934   GFRNONAA >60 02/06/2013 2146   GFRAA 133 05/06/2017 0934   GFRAA >60 02/06/2013 2146   Lab Results  Component Value Date   WBC 7.3 05/06/2017   HGB 12.1 05/06/2017   HCT 38.7 05/06/2017   MCV 91 05/06/2017   PLT 348 05/06/2017    Imaging Studies: No results found.  Assessment and Plan:   Sheila Benton is a 37 y.o. y/o female here for follow-up of abdominal pain and bloating  Stool for H. pylori was ordered on last visit but patient did not have it done Today she is complaining of new pain in her left mid quadrant, also complaining of urinary symptoms Will order UA We will also order a CT scan to rule out diverticulitis  If these are negative, can proceed with EGD to obtain H. pylori biopsies, rule out peptic ulcer disease given her NSAID use, and ongoing symptoms despite PPI and H2 RA in the past  I have discussed alternative options, risks & benefits,  which include, but are not limited to, bleeding, infection, perforation,respiratory complication & drug reaction.  The patient agrees with this plan & written consent will be obtained.     Dr Vonda Antigua

## 2017-08-27 ENCOUNTER — Other Ambulatory Visit: Payer: Self-pay | Admitting: Family Medicine

## 2017-08-27 NOTE — Telephone Encounter (Signed)
Albuterol 108 (90 base) mcg/act inhaler refill request  LOV 05/06/17 with Dr. Wynetta Emery.  [id number] - [place], [place]

## 2017-08-28 ENCOUNTER — Other Ambulatory Visit: Payer: Self-pay

## 2017-08-28 DIAGNOSIS — K219 Gastro-esophageal reflux disease without esophagitis: Secondary | ICD-10-CM

## 2017-09-20 ENCOUNTER — Other Ambulatory Visit: Payer: Self-pay

## 2017-09-20 ENCOUNTER — Ambulatory Visit: Payer: BLUE CROSS/BLUE SHIELD | Admitting: Anesthesiology

## 2017-09-20 ENCOUNTER — Encounter: Payer: Self-pay | Admitting: *Deleted

## 2017-09-20 ENCOUNTER — Encounter
Admission: RE | Disposition: A | Discharge status: Home / Self Care | Payer: Self-pay | Source: Ambulatory Visit | Attending: Gastroenterology

## 2017-09-20 ENCOUNTER — Ambulatory Visit
Admission: RE | Admit: 2017-09-20 | Discharge: 2017-09-20 | Disposition: A | Discharge status: Home / Self Care | Payer: BLUE CROSS/BLUE SHIELD | Source: Ambulatory Visit | Attending: Gastroenterology | Admitting: Gastroenterology

## 2017-09-20 DIAGNOSIS — F988 Other specified behavioral and emotional disorders with onset usually occurring in childhood and adolescence: Secondary | ICD-10-CM | POA: Diagnosis not present

## 2017-09-20 DIAGNOSIS — Z91041 Radiographic dye allergy status: Secondary | ICD-10-CM | POA: Insufficient documentation

## 2017-09-20 DIAGNOSIS — Z79899 Other long term (current) drug therapy: Secondary | ICD-10-CM | POA: Insufficient documentation

## 2017-09-20 DIAGNOSIS — K3189 Other diseases of stomach and duodenum: Secondary | ICD-10-CM

## 2017-09-20 DIAGNOSIS — Z8249 Family history of ischemic heart disease and other diseases of the circulatory system: Secondary | ICD-10-CM | POA: Insufficient documentation

## 2017-09-20 DIAGNOSIS — K2950 Unspecified chronic gastritis without bleeding: Secondary | ICD-10-CM | POA: Diagnosis not present

## 2017-09-20 DIAGNOSIS — K449 Diaphragmatic hernia without obstruction or gangrene: Secondary | ICD-10-CM

## 2017-09-20 DIAGNOSIS — F419 Anxiety disorder, unspecified: Secondary | ICD-10-CM | POA: Insufficient documentation

## 2017-09-20 DIAGNOSIS — Z87891 Personal history of nicotine dependence: Secondary | ICD-10-CM | POA: Diagnosis not present

## 2017-09-20 DIAGNOSIS — R1084 Generalized abdominal pain: Secondary | ICD-10-CM | POA: Diagnosis present

## 2017-09-20 DIAGNOSIS — J45909 Unspecified asthma, uncomplicated: Secondary | ICD-10-CM | POA: Diagnosis not present

## 2017-09-20 DIAGNOSIS — R112 Nausea with vomiting, unspecified: Secondary | ICD-10-CM | POA: Diagnosis not present

## 2017-09-20 DIAGNOSIS — Z888 Allergy status to other drugs, medicaments and biological substances status: Secondary | ICD-10-CM | POA: Insufficient documentation

## 2017-09-20 DIAGNOSIS — K219 Gastro-esophageal reflux disease without esophagitis: Secondary | ICD-10-CM

## 2017-09-20 HISTORY — PX: ESOPHAGOGASTRODUODENOSCOPY (EGD) WITH PROPOFOL: SHX5813

## 2017-09-20 SURGERY — ESOPHAGOGASTRODUODENOSCOPY (EGD) WITH PROPOFOL
Anesthesia: General

## 2017-09-20 MED ORDER — PROPOFOL 10 MG/ML IV BOLUS
INTRAVENOUS | Status: DC | PRN
  Administered 2017-09-20: 70 mg via INTRAVENOUS

## 2017-09-20 MED ORDER — PROPOFOL 500 MG/50ML IV EMUL
INTRAVENOUS | Status: AC
  Filled 2017-09-20: qty 50

## 2017-09-20 MED ORDER — SODIUM CHLORIDE 0.9 % IV SOLN
INTRAVENOUS | Status: DC
  Administered 2017-09-20: 07:00:00 via INTRAVENOUS

## 2017-09-20 MED ORDER — PROPOFOL 500 MG/50ML IV EMUL
INTRAVENOUS | Status: DC | PRN
  Administered 2017-09-20: 150 ug/kg/min via INTRAVENOUS

## 2017-09-20 MED ORDER — LIDOCAINE HCL (PF) 2 % IJ SOLN
INTRAMUSCULAR | Status: DC | PRN
  Administered 2017-09-20: 100 mg via INTRADERMAL

## 2017-09-20 NOTE — Transfer of Care (Signed)
Immediate Anesthesia Transfer of Care Note  Patient: Sheila Benton  Procedure(s) Performed: ESOPHAGOGASTRODUODENOSCOPY (EGD) WITH PROPOFOL (N/A )  Patient Location: PACU  Anesthesia Type:General  Level of Consciousness: sedated  Airway & Oxygen Therapy: Patient Spontanous Breathing and Patient connected to nasal cannula oxygen  Post-op Assessment: Report given to RN and Post -op Vital signs reviewed and stable  Post vital signs: Reviewed and stable  Last Vitals:  Vitals Value Taken Time  BP    Temp    Pulse    Resp    SpO2      Last Pain:  Vitals:   09/20/17 0706  TempSrc: Tympanic         Complications: No apparent anesthesia complications

## 2017-09-20 NOTE — Anesthesia Postprocedure Evaluation (Signed)
Anesthesia Post Note  Patient: Sheila Benton  Procedure(s) Performed: ESOPHAGOGASTRODUODENOSCOPY (EGD) WITH PROPOFOL (N/A )  Patient location during evaluation: Endoscopy Anesthesia Type: General Level of consciousness: awake and alert Pain management: pain level controlled Vital Signs Assessment: post-procedure vital signs reviewed and stable Respiratory status: spontaneous breathing, nonlabored ventilation, respiratory function stable and patient connected to nasal cannula oxygen Cardiovascular status: blood pressure returned to baseline and stable Postop Assessment: no apparent nausea or vomiting Anesthetic complications: no     Last Vitals:  Vitals:   09/20/17 0855 09/20/17 0905  BP: 103/77 (!) 100/56  Pulse: 78 82  Resp: 15 19  Temp:    SpO2: 99% 98%    Last Pain:  Vitals:   09/20/17 0905  TempSrc:   PainSc: 0-No pain                 Treven Holtman S

## 2017-09-20 NOTE — Anesthesia Preprocedure Evaluation (Signed)
Anesthesia Evaluation  Patient identified by MRN, date of birth, ID band Patient awake    Reviewed: Allergy & Precautions, NPO status , Patient's Chart, lab work & pertinent test results, reviewed documented beta blocker date and time   Airway Mallampati: II  TM Distance: >3 FB     Dental  (+) Chipped   Pulmonary asthma , former smoker,           Cardiovascular      Neuro/Psych  Headaches, PSYCHIATRIC DISORDERS Anxiety    GI/Hepatic   Endo/Other    Renal/GU      Musculoskeletal   Abdominal   Peds  Hematology  (+) anemia ,   Anesthesia Other Findings ADD.  Reproductive/Obstetrics                             Anesthesia Physical Anesthesia Plan  ASA: II  Anesthesia Plan: General   Post-op Pain Management:    Induction: Intravenous  PONV Risk Score and Plan:   Airway Management Planned:   Additional Equipment:   Intra-op Plan:   Post-operative Plan:   Informed Consent: I have reviewed the patients History and Physical, chart, labs and discussed the procedure including the risks, benefits and alternatives for the proposed anesthesia with the patient or authorized representative who has indicated his/her understanding and acceptance.     Plan Discussed with: CRNA  Anesthesia Plan Comments:         Anesthesia Quick Evaluation

## 2017-09-20 NOTE — H&P (Signed)
Sheila Antigua, MD [address] [place], [place], [place], [address] [address], [place], [place], [place], [address] Phone: [phone]  Fax: [phone]  Primary Care Physician:  Valerie Roys, DO   Pre-Procedure History & Physical: HPI:  Sheila Benton is a 37 y.o. female is here for an EGD.   Past Medical History:  Diagnosis Date  . ADD (attention deficit disorder)   . Anemia   . Anxiety   . Asthma    WELL CONTROLLED  . Headache    MIGRAINES  . History of kidney stones   . Irregular menses     Past Surgical History:  Procedure Laterality Date  . ABDOMINAL HYSTERECTOMY    . CYSTOSCOPY  07/24/2016   Procedure: CYSTOSCOPY;  Surgeon: Gae Dry, MD;  Location: [facility] ORS;  Service: Gynecology;;  . LAPAROSCOPIC BILATERAL SALPINGECTOMY Bilateral 07/24/2016   Procedure: LAPAROSCOPIC BILATERAL SALPINGECTOMY;  Surgeon: Gae Dry, MD;  Location: [facility];  Service: Gynecology;  Laterality: Bilateral;  . LAPAROSCOPIC HYSTERECTOMY N/A 07/24/2016   Procedure: HYSTERECTOMY TOTAL LAPAROSCOPIC;  Surgeon: Gae Dry, MD;  Location: [facility];  Service: Gynecology;  Laterality: N/A;  . TUBAL LIGATION  12/24/2003    Prior to Admission medications   Medication Sig Start Date End Date Taking? Authorizing Provider  albuterol (PROVENTIL HFA;VENTOLIN HFA) 108 (90 Base) MCG/ACT inhaler INHALE 1 PUFF INTO THE LUNGS EVERY 6 HOURS AS NEEDED FOR WHEEZING OR SHORTNESS OF BREATH 08/27/17  Yes Volney American, PA-C  albuterol (PROVENTIL) (2.5 MG/3ML) 0.083% nebulizer solution VVN Q 6 H PRF WHZ OR SOB 02/05/17  Yes [provider]  fluticasone furoate-vilanterol (BREO ELLIPTA) 200-25 MCG/INH AEPB Inhale 1 puff into the lungs daily. 05/06/17  Yes Johnson, Megan P, DO  naproxen (NAPROSYN) 500 MG tablet TAKE 1 TABLET(500 MG) BY MOUTH TWICE DAILY WITH A MEAL 08/27/17  Yes Volney American, PA-C  omeprazole (PRILOSEC) 20 MG capsule Take 20 mg by mouth daily.    Yes [provider]  VENTOLIN HFA 108 (90 Base) MCG/ACT inhaler INHALE 1 TO 2 PUFFS INTO THE LUNGS EVERY 6 HOURS AS NEEDED FOR WHEEZING OR SHORTNESS OF BREATH 08/27/17  Yes Volney American, PA-C  Armodafinil 250 MG tablet Take 1 tablet (250 mg total) by mouth daily. Patient taking differently: Take 250 mg by mouth daily. Try 1/2 tab for 1 month then increase to full tab 04/18/17   Dohmeier, Asencion Partridge, MD    Allergies as of 08/28/2017 - Review Complete 08/26/2017  Allergen Reaction Noted  . Armodafinil Other (See Comments) 07/16/2017  . Contrast media [iodinated diagnostic agents] Rash 01/07/2017  . Omnipaque [iohexol] Itching 04/26/2015    Family History  Problem Relation Age of Onset  . Hypertension Mother   . Endometriosis Sister   . Heart disease Maternal Grandmother   . Hypertension Maternal Grandmother   . Stroke Maternal Grandmother   . Lung cancer Maternal Grandfather     Social History   Socioeconomic History  . Marital status: Married    Spouse name: Not on file  . Number of children: Not on file  . Years of education: Not on file  . Highest education level: Not on file  Occupational History  . Not on file  Social Needs  . Financial resource strain: Not on file  . Food insecurity:    Worry: Not on file    Inability: Not on file  . Transportation needs:    Medical: Not on file  Non-medical: Not on file  Tobacco Use  . Smoking status: Former Research scientist (life sciences)  . Smokeless tobacco: Never Used  Substance and Sexual Activity  . Alcohol use: No  . Drug use: No  . Sexual activity: Yes    Comment: Tubes Tied  Lifestyle  . Physical activity:    Days per week: Not on file    Minutes per session: Not on file  . Stress: Not on file  Relationships  . Social connections:    Talks on phone: Not on file    Gets together: Not on file    Attends religious service: Not on file    Active member of club or organization: Not on file    Attends meetings of clubs or  organizations: Not on file    Relationship status: Not on file  . Intimate partner violence:    Fear of current or ex partner: Not on file    Emotionally abused: Not on file    Physically abused: Not on file    Forced sexual activity: Not on file  Other Topics Concern  . Not on file  Social History Narrative  . Not on file    Review of Systems: See HPI, otherwise negative ROS  Physical Exam: BP 119/82   Pulse 83   Temp (!) 97.1 F (36.2 C) (Tympanic)   Resp 18   Ht 5\' 6"  (1.676 m)   Wt 145 lb (65.8 kg)   LMP 07/13/2016 (Exact Date)   SpO2 98%   BMI 23.40 kg/m  General:   Alert,  pleasant and cooperative in NAD Head:  Normocephalic and atraumatic. Neck:  Supple; no masses or thyromegaly. Lungs:  Clear throughout to auscultation, normal respiratory effort.    Heart:  +S1, +S2, Regular rate and rhythm, No edema. Abdomen:  Soft, nontender and nondistended. Normal bowel sounds, without guarding, and without rebound.   Neurologic:  Alert and  oriented x4;  grossly normal neurologically.  Impression/Plan: Sheila Benton is here for an EGD for abdominal pain  Risks, benefits, limitations, and alternatives regarding the procedure have been reviewed with the patient.  Questions have been answered.  All parties agreeable.   Virgel Manifold, MD  09/20/2017, 8:02 AM

## 2017-09-20 NOTE — Anesthesia Procedure Notes (Signed)
Date/Time: 09/20/2017 8:16 AM Performed by: Nelda Marseille, CRNA Pre-anesthesia Checklist: Patient identified, Emergency Drugs available, Suction available, Patient being monitored and Timeout performed Oxygen Delivery Method: Nasal cannula

## 2017-09-20 NOTE — Op Note (Signed)
[facility] Gastroenterology Patient Name: Sheila Benton Procedure Date: 09/20/2017 7:14 AM MRN: [id number] Account #: [id number] Date of Birth: [birth year] Admit Type: Outpatient Age: 37 Room: [facility] ENDO ROOM 2 Gender: Female Note Status: Finalized Procedure:            Upper GI endoscopy Indications:          Generalized abdominal pain, Nausea with vomiting Providers:            Varnita B. Bonna Gains MD, MD Referring MD:         Valerie Roys (Referring MD) Medicines:            Monitored Anesthesia Care Complications:        No immediate complications. Procedure:            Pre-Anesthesia Assessment:                       - Prior to the procedure, a History and Physical was                        performed, and patient medications, allergies and                        sensitivities were reviewed. The patient's tolerance of                        previous anesthesia was reviewed.                       - The risks and benefits of the procedure and the                        sedation options and risks were discussed with the                        patient. All questions were answered and informed                        consent was obtained.                       - Patient identification and proposed procedure were                        verified prior to the procedure by the physician, the                        nurse, the anesthesiologist, the anesthetist and the                        technician. The procedure was verified in the procedure                        room.                       - ASA Grade Assessment: II - A patient with mild                        systemic disease.  After obtaining informed consent, the endoscope was                        passed under direct vision. Throughout the procedure,                        the patient's blood pressure, pulse, and oxygen                        saturations were monitored continuously.  The Endoscope                        was introduced through the mouth, and advanced to the                        third part of duodenum. The upper GI endoscopy was                        accomplished with ease. The patient tolerated the                        procedure well. Findings:      The examined esophagus was normal.      The Z-line was regular and was found 36 cm from the incisors.      Patchy mildly erythematous mucosa without bleeding was found in the       gastric antrum. Biopsies were taken with a cold forceps for histology.       Biopsies were obtained in the gastric body, at the incisura and in the       gastric antrum with cold forceps for histology.      A 1 cm hiatal hernia was present.      The duodenal bulb, second portion of the duodenum, third portion of the       duodenum and examined duodenum were normal. Impression:           - Normal esophagus.                       - Z-line regular, 36 cm from the incisors.                       - Erythematous mucosa in the antrum. Biopsied.                       - 1 cm hiatal hernia.                       - Normal duodenal bulb, second portion of the duodenum,                        third portion of the duodenum and examined duodenum.                       - Biopsies were obtained in the gastric body, at the                        incisura and in the gastric antrum. Recommendation:       - Await pathology results.                       -  Patient's CT scans in 2017 and recent one in 2019                        shows chronic malrotation of the bowel (no volvulus                        reported on imaging or suggested by clinical exam).                        Surgical referral indicated for evaluation. Will refer                        at this time.                       - Discharge patient to home (with escort).                       - Advance diet as tolerated.                       - Continue present medications.                        - Patient has a contact number available for                        emergencies. The signs and symptoms of potential                        delayed complications were discussed with the patient.                        Return to normal activities tomorrow. Written discharge                        instructions were provided to the patient.                       - Discharge patient to home (with escort).                       - The findings and recommendations were discussed with                        the patient.                       - The findings and recommendations were discussed with                        the patient's family. Procedure Code(s):    --- Professional ---                       [id number], Esophagogastroduodenoscopy, flexible, transoral;                        with biopsy, single or multiple Diagnosis Code(s):    --- Professional ---                       K31.89, Other diseases of stomach and duodenum  K44.9, Diaphragmatic hernia without obstruction or                        gangrene                       R10.84, Generalized abdominal pain                       R11.2, Nausea with vomiting, unspecified CPT copyright 2017 American Medical Association. All rights reserved. The codes documented in this report are preliminary and upon coder review may  be revised to meet current compliance requirements.  Vonda Antigua, MD Margretta Sidle B. Bonna Gains MD, MD 09/20/2017 8:24:23 AM This report has been signed electronically. Number of Addenda: 0 Note Initiated On: 09/20/2017 7:14 AM Estimated Blood Loss: Estimated blood loss: none.      [facility]

## 2017-09-20 NOTE — Anesthesia Post-op Follow-up Note (Signed)
Anesthesia QCDR form completed.        

## 2017-09-23 ENCOUNTER — Encounter: Payer: Self-pay | Admitting: Gastroenterology

## 2017-09-23 LAB — SURGICAL PATHOLOGY

## 2017-09-26 ENCOUNTER — Encounter: Payer: Self-pay | Admitting: Gastroenterology

## 2017-10-07 ENCOUNTER — Ambulatory Visit: Payer: BLUE CROSS/BLUE SHIELD | Admitting: Gastroenterology

## 2017-10-07 ENCOUNTER — Encounter: Payer: Self-pay | Admitting: Gastroenterology

## 2017-10-07 VITALS — BP 103/68 | HR 90 | Ht 66.0 in | Wt 147.2 lb

## 2017-10-07 DIAGNOSIS — R1084 Generalized abdominal pain: Secondary | ICD-10-CM | POA: Diagnosis not present

## 2017-10-07 DIAGNOSIS — R112 Nausea with vomiting, unspecified: Secondary | ICD-10-CM | POA: Diagnosis not present

## 2017-10-07 DIAGNOSIS — Q433 Congenital malformations of intestinal fixation: Secondary | ICD-10-CM | POA: Diagnosis not present

## 2017-10-07 NOTE — Patient Instructions (Signed)
FOD MAP diet given F/u 3-6 months

## 2017-10-07 NOTE — Progress Notes (Signed)
Vonda Antigua, MD [address]  [place]  [place], [place] [address]  Main: [phone]  Fax: [phone]   Primary Care Physician: Valerie Roys, DO  Primary Gastroenterologist:  Dr. Vonda Antigua  Chief Complaint  Patient presents with  . Follow-up    1 MONTH abdominal pain, n/v    HPI: Sheila Benton is a 37 y.o. female here for follow-up of abdominal pain nausea vomiting.  Patient is on omeprazole twice daily which is helping her symptoms.  It helps her symptoms if she takes it with food, and actually exacerbates his symptoms if she takes it 30 minutes before food.  No dysphagia or weight loss.  Pain 5/10, occurring daily, nausea and vomiting occurring once to twice a week.  Nonradiating, cramping.  She underwent EGD on September 20, 2017 with biopsies negative for H. pylori.  It showed gastric erythema, 1 cm hiatal hernia.  She also has chronic malrotation deformity of the bowel loops noted on previous CT scans.  Due to her intermittent nausea and vomiting, and abdominal pain which can be explained by her chronic malrotation noted on 2017 and 2019 CT scans, surgical referral has been placed, and is pending.  Current Outpatient Medications  Medication Sig Dispense Refill  . albuterol (PROVENTIL) (2.5 MG/3ML) 0.083% nebulizer solution VVN Q 6 H PRF WHZ OR SOB  3  . fluticasone furoate-vilanterol (BREO ELLIPTA) 200-25 MCG/INH AEPB Inhale 1 puff into the lungs daily. 60 each 12  . naproxen (NAPROSYN) 500 MG tablet TAKE 1 TABLET(500 MG) BY MOUTH TWICE DAILY WITH A MEAL 60 tablet 0  . omeprazole (PRILOSEC) 20 MG capsule Take 20 mg by mouth daily.    Marland Kitchen albuterol (PROVENTIL HFA;VENTOLIN HFA) 108 (90 Base) MCG/ACT inhaler INHALE 1 PUFF INTO THE LUNGS EVERY 6 HOURS AS NEEDED FOR WHEEZING OR SHORTNESS OF BREATH (Patient not taking: Reported on 10/07/2017) 8.5 g 0  . Armodafinil 250 MG tablet Take 1 tablet (250 mg total) by mouth daily. (Patient not taking: Reported on  10/07/2017) 30 tablet 5  . VENTOLIN HFA 108 (90 Base) MCG/ACT inhaler INHALE 1 TO 2 PUFFS INTO THE LUNGS EVERY 6 HOURS AS NEEDED FOR WHEEZING OR SHORTNESS OF BREATH (Patient not taking: Reported on 10/07/2017) 18 g 0   No current facility-administered medications for this visit.     Allergies as of 10/07/2017 - Review Complete 10/07/2017  Allergen Reaction Noted  . Armodafinil Other (See Comments) 07/16/2017  . Contrast media [iodinated diagnostic agents] Rash 01/07/2017  . Omnipaque [iohexol] Itching 04/26/2015    ROS:  General: Negative for anorexia, weight loss, fever, chills, fatigue, weakness. ENT: Negative for hoarseness, difficulty swallowing , nasal congestion. CV: Negative for chest pain, angina, palpitations, dyspnea on exertion, peripheral edema.  Respiratory: Negative for dyspnea at rest, dyspnea on exertion, cough, sputum, wheezing.  GI: See history of present illness. GU:  Negative for dysuria, hematuria, urinary incontinence, urinary frequency, nocturnal urination.  Endo: Negative for unusual weight change.    Physical Examination:   BP 103/68   Pulse 90   Ht 5\' 6"  (1.676 m)   Wt 147 lb 3.2 oz (66.8 kg)   LMP 07/13/2016 (Exact Date)   BMI 23.76 kg/m   General: Well-nourished, well-developed in no acute distress.  Eyes: No icterus. Conjunctivae pink. Mouth: Oropharyngeal mucosa moist and pink , no lesions erythema or exudate. Neck: Supple, Trachea midline Abdomen: Bowel sounds are normal, nontender, nondistended, no hepatosplenomegaly or masses, no abdominal bruits or hernia , no  rebound or guarding.   Extremities: No lower extremity edema. No clubbing or deformities. Neuro: Alert and oriented x 3.  Grossly intact. Skin: Warm and dry, no jaundice.   Psych: Alert and cooperative, normal mood and affect.   Labs: CMP     Component Value Date/Time   NA 140 05/06/2017 0934   NA 136 02/06/2013 2146   K 3.6 05/06/2017 0934   K 3.4 (L) 02/06/2013 2146   CL 104  05/06/2017 0934   CL 105 02/06/2013 2146   CO2 23 05/06/2017 0934   CO2 27 02/06/2013 2146   GLUCOSE 72 05/06/2017 0934   GLUCOSE 103 (H) 02/06/2013 2146   BUN 7 05/06/2017 0934   BUN 10 02/06/2013 2146   CREATININE 0.61 05/06/2017 0934   CREATININE 0.84 02/06/2013 2146   CALCIUM 9.1 05/06/2017 0934   CALCIUM 9.4 02/06/2013 2146   PROT 6.4 05/06/2017 0934   PROT 7.5 02/06/2013 2146   ALBUMIN 4.1 05/06/2017 0934   ALBUMIN 3.8 02/06/2013 2146   AST 14 05/06/2017 0934   AST 16 02/06/2013 2146   ALT 8 05/06/2017 0934   ALT 13 02/06/2013 2146   ALKPHOS 70 05/06/2017 0934   ALKPHOS 94 02/06/2013 2146   BILITOT <0.2 05/06/2017 0934   BILITOT 0.6 02/06/2013 2146   GFRNONAA 115 05/06/2017 0934   GFRNONAA >60 02/06/2013 2146   GFRAA 133 05/06/2017 0934   GFRAA >60 02/06/2013 2146   Lab Results  Component Value Date   WBC 7.3 05/06/2017   HGB 12.1 05/06/2017   HCT 38.7 05/06/2017   MCV 91 05/06/2017   PLT 348 05/06/2017    Imaging Studies: No results found.  Assessment and Plan:   Sheila Benton is a 37 y.o. y/o female here for follow-up of abdominal pain and nausea vomiting  As per this article in the journal [email or web address]  We have referred patient to tertiary care center for evaluation of surgery, as her chronic quad malrotation can explain some of her symptoms  Other work-up so far has been unrevealing otherwise  Continue omeprazole twice daily as it is helping her Zantac did not help her in the past (Risks of PPI use were discussed with patient including bone loss, C. Diff diarrhea, pneumonia, infections, CKD, electrolyte abnormalities.  If clinically possible based on symptoms, goal would be to maintain patient on the lowest dose possible, or discontinue the medication with institution of acid reflux lifestyle modifications over time. Pt. Verbalizes understanding and chooses to continue the medication.)  We will also try low  FODMAP diet, handout given for the same   Dr Vonda Antigua

## 2017-10-07 NOTE — Telephone Encounter (Signed)
I sent my chart msg 8/9 that referral has been sent.

## 2017-10-12 ENCOUNTER — Other Ambulatory Visit: Payer: Self-pay | Admitting: Family Medicine

## 2017-10-15 IMAGING — US US ABDOMEN LIMITED
1 series · 14 of 25 positions shown · non-contrast
Comparison: CT abdomen pelvis of 02/07/2013

CLINICAL DATA: Right upper quadrant abdominal pain for 6 months

EXAM:
US ABDOMEN LIMITED - RIGHT UPPER QUADRANT

[Series 1: us abdomen limited · 0.19mm/px · 14 of 50 slices shown]
[im 1/50]
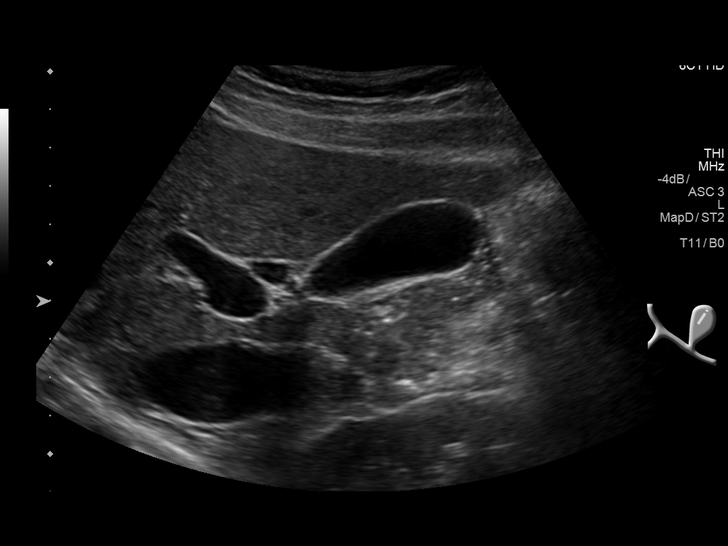
[im 5/50]
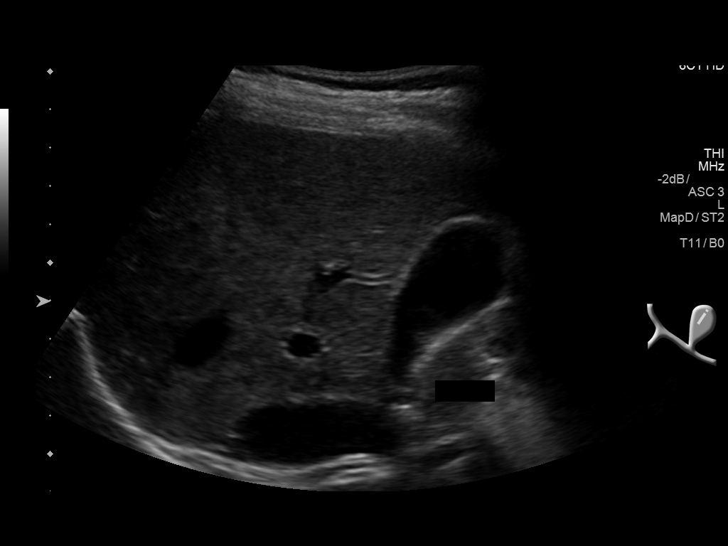
[im 9/50]
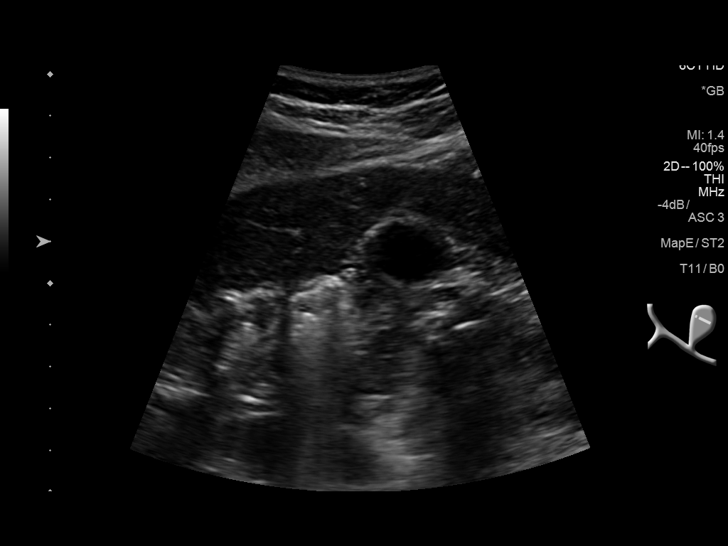
[im 13/50]
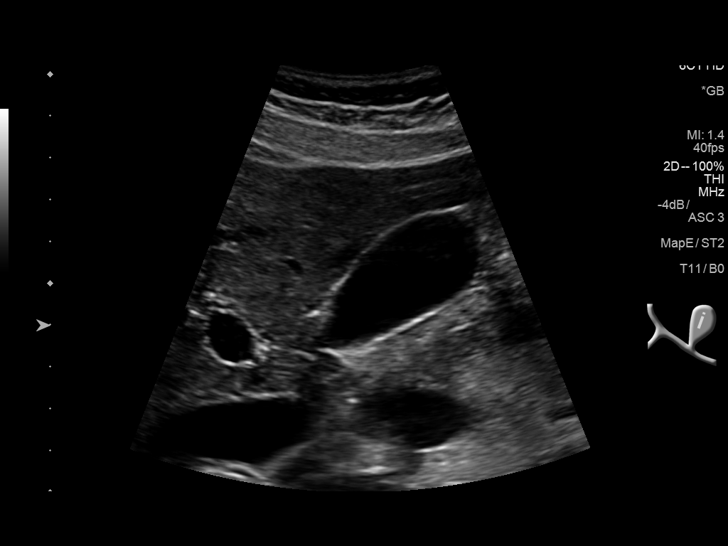
[im 17/50]
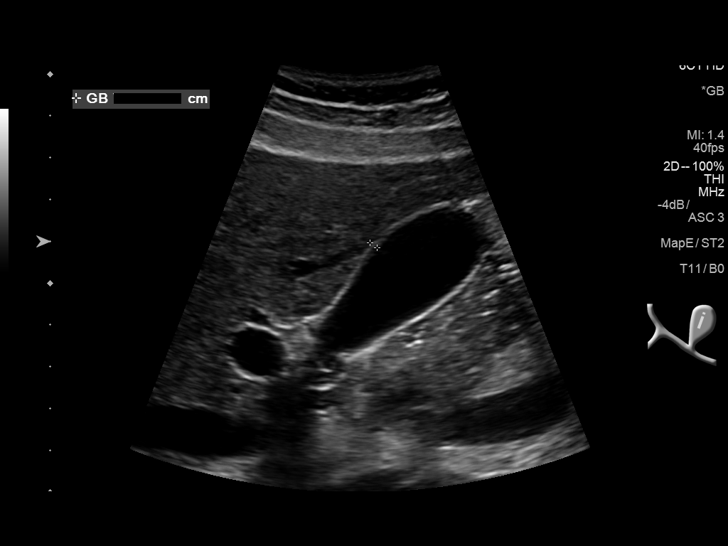
[im 19/50]
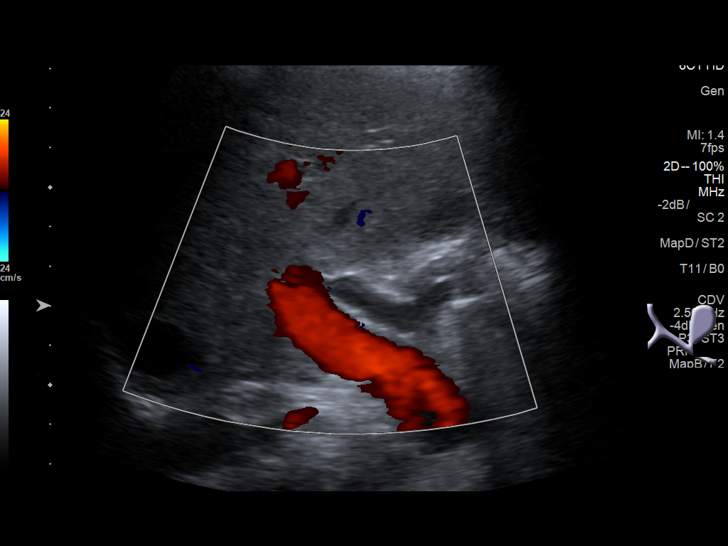
[im 23/50]
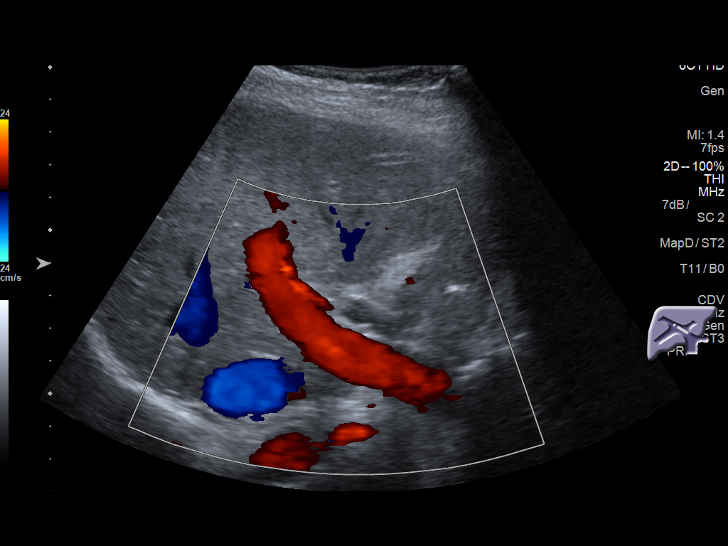
[im 27/50]
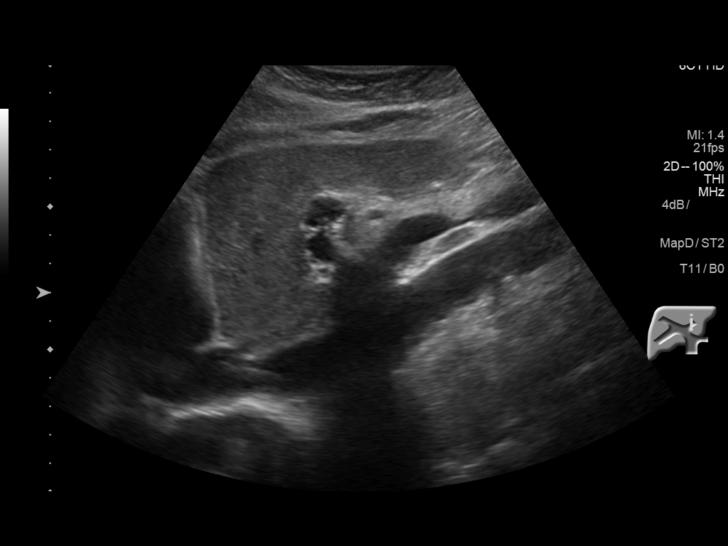
[im 31/50]
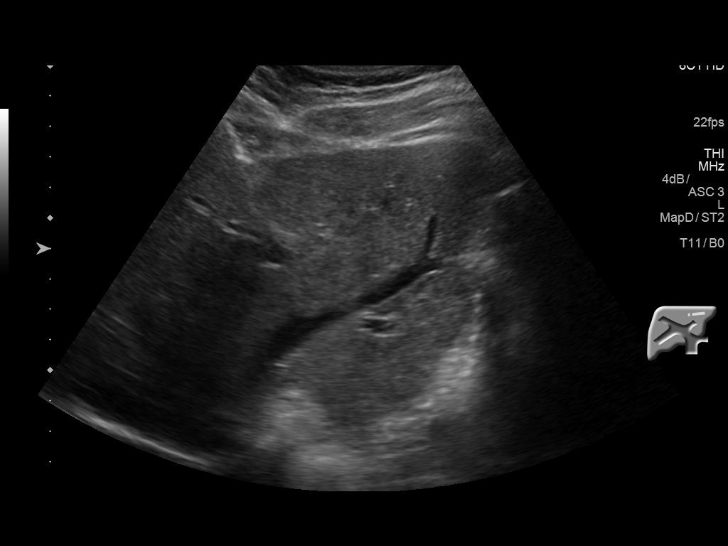
[im 33/50]
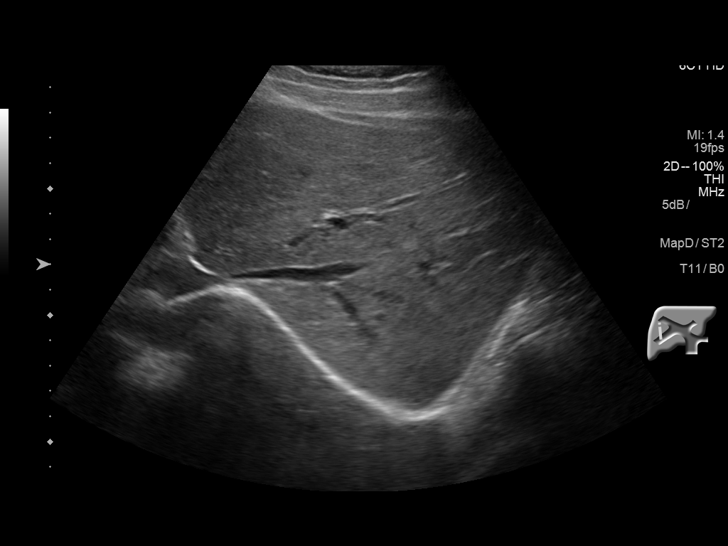
[im 37/50]
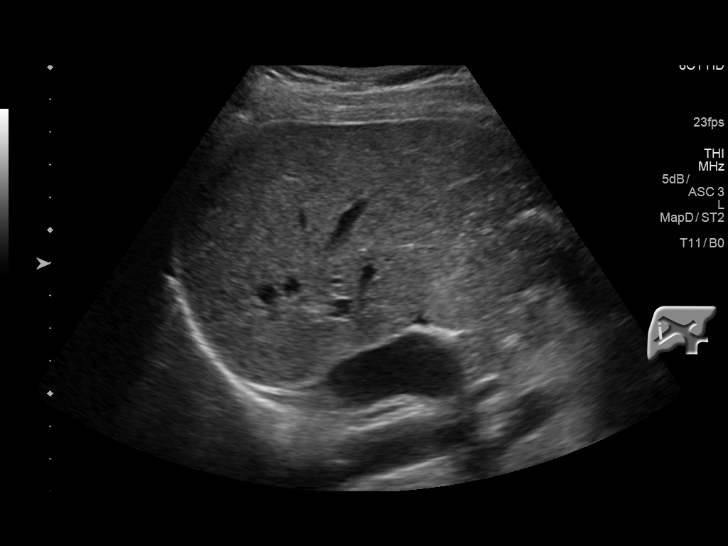
[im 41/50]
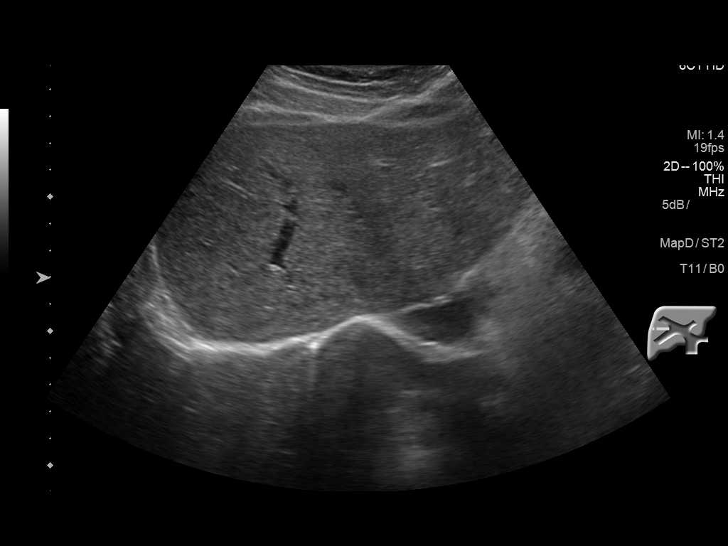
[im 45/50]
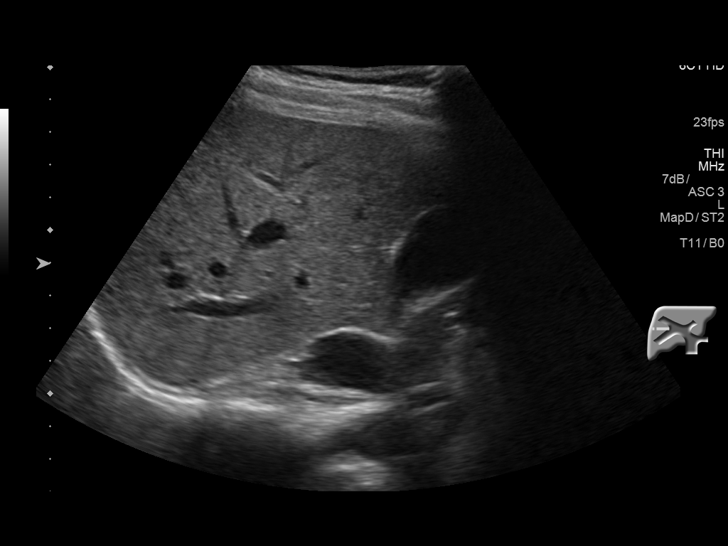
[im 50/50]
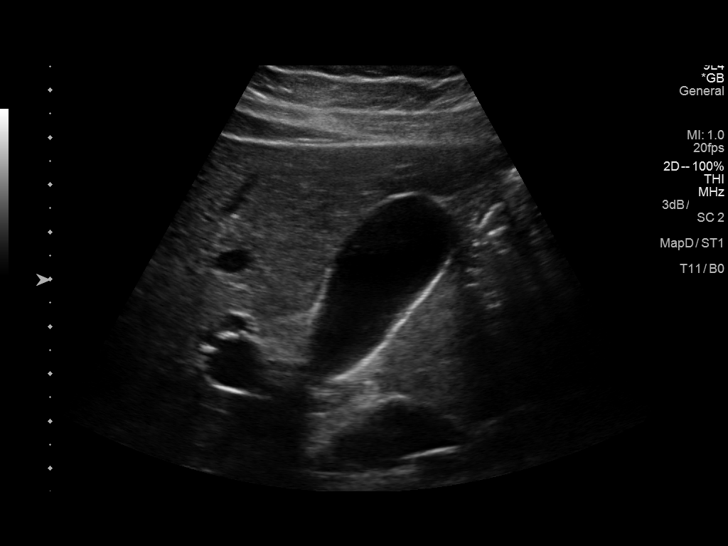

[14 of 25 positions shown; findings below may reference images not displayed]

FINDINGS: Gallbladder:

The gallbladder is visualized and no gallstones are noted. There is
no pain over the gallbladder with compression.

Common bile duct:

Diameter: The common bile duct is slightly from measuring between
6.7 and 7.7 mm distally. A distal common bile duct calculus,
stricture, or mass cannot be excluded. I did review the unenhanced
CT, but on that unenhanced study, the distal common bile duct and
pancreas are not well visualized.

Liver:

The liver is unremarkable with a normal echogenic pattern. No focal
hepatic abnormality is seen.
IMPRESSION: 1. Slightly prominent common bile duct. Cannot exclude a distal
common bile duct calculus, stricture, or mass. Consider CT of the
abdomen with oral and IV contrast.
2. No gallstones.

## 2017-10-26 IMAGING — DX DG CHEST 2V
2 series · 2 of 2 positions shown · non-contrast
Comparison: Chest radiograph performed 01/17/2010

CLINICAL DATA: Chronic fatigue and left-sided chest pain. Initial
encounter.

EXAM:
CHEST  2 VIEW

[chest pa]
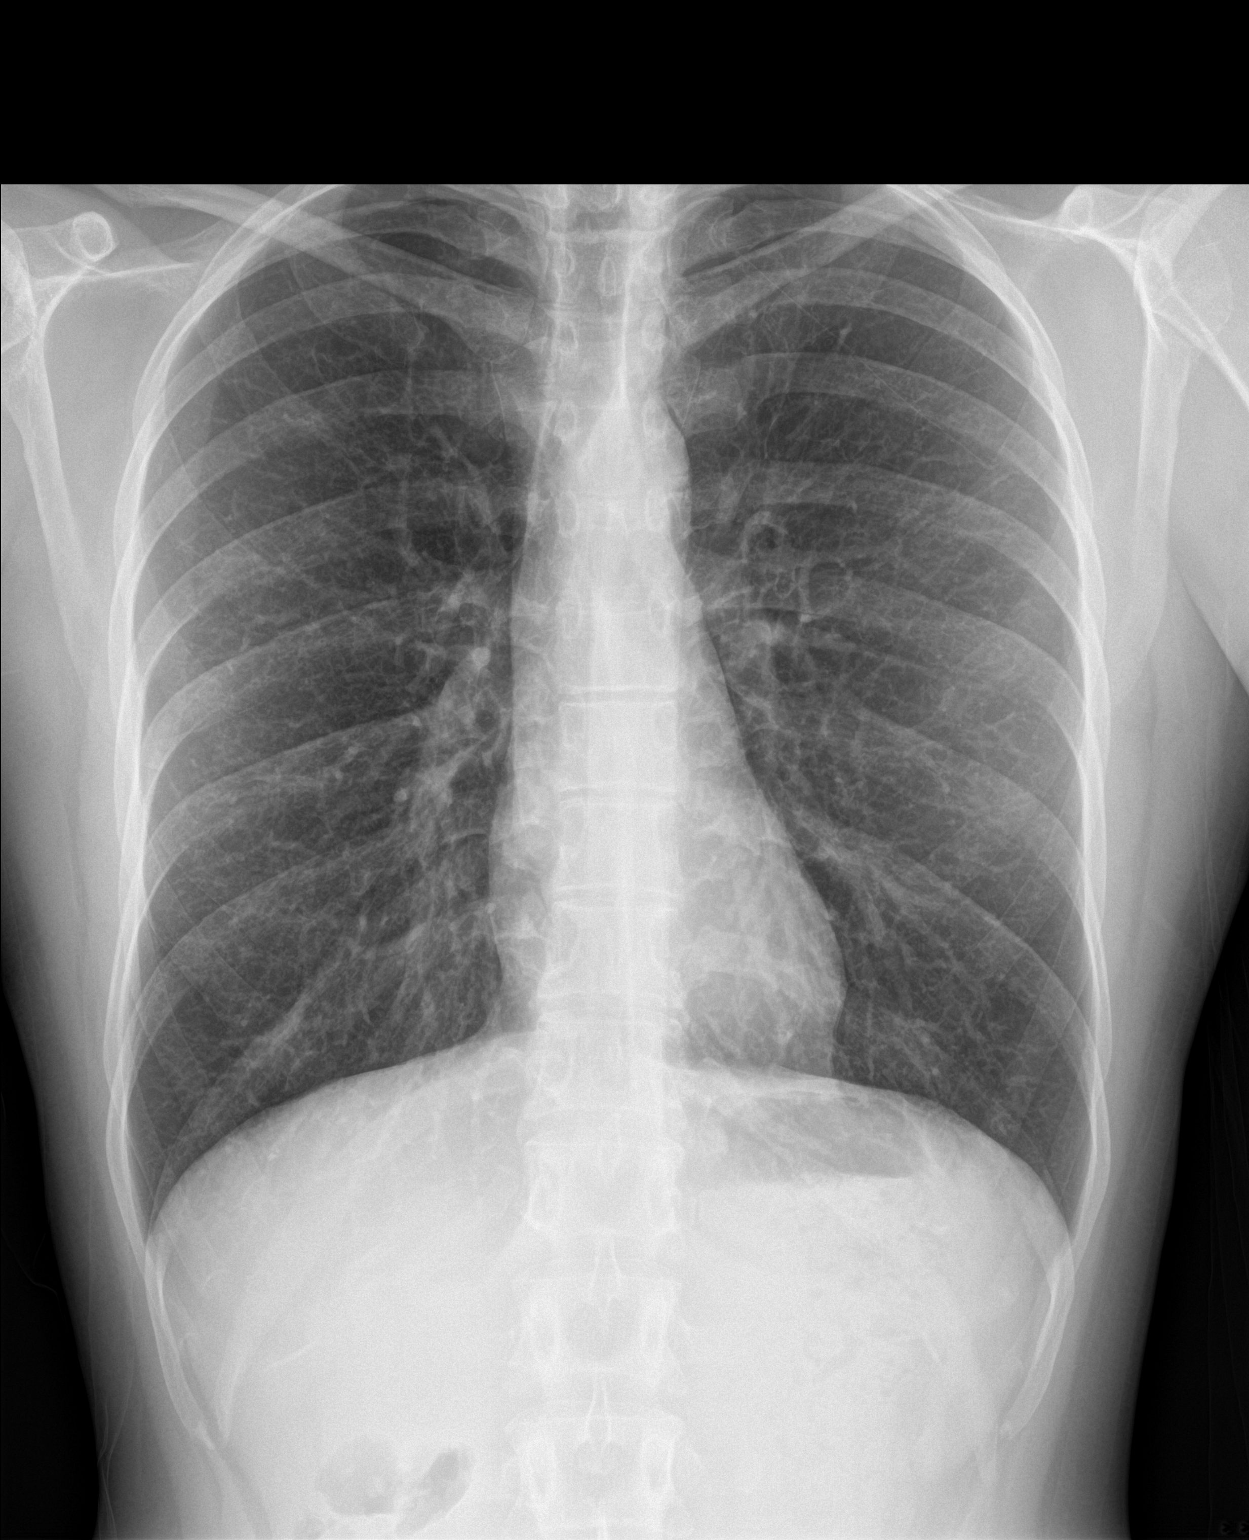

[chest lat]
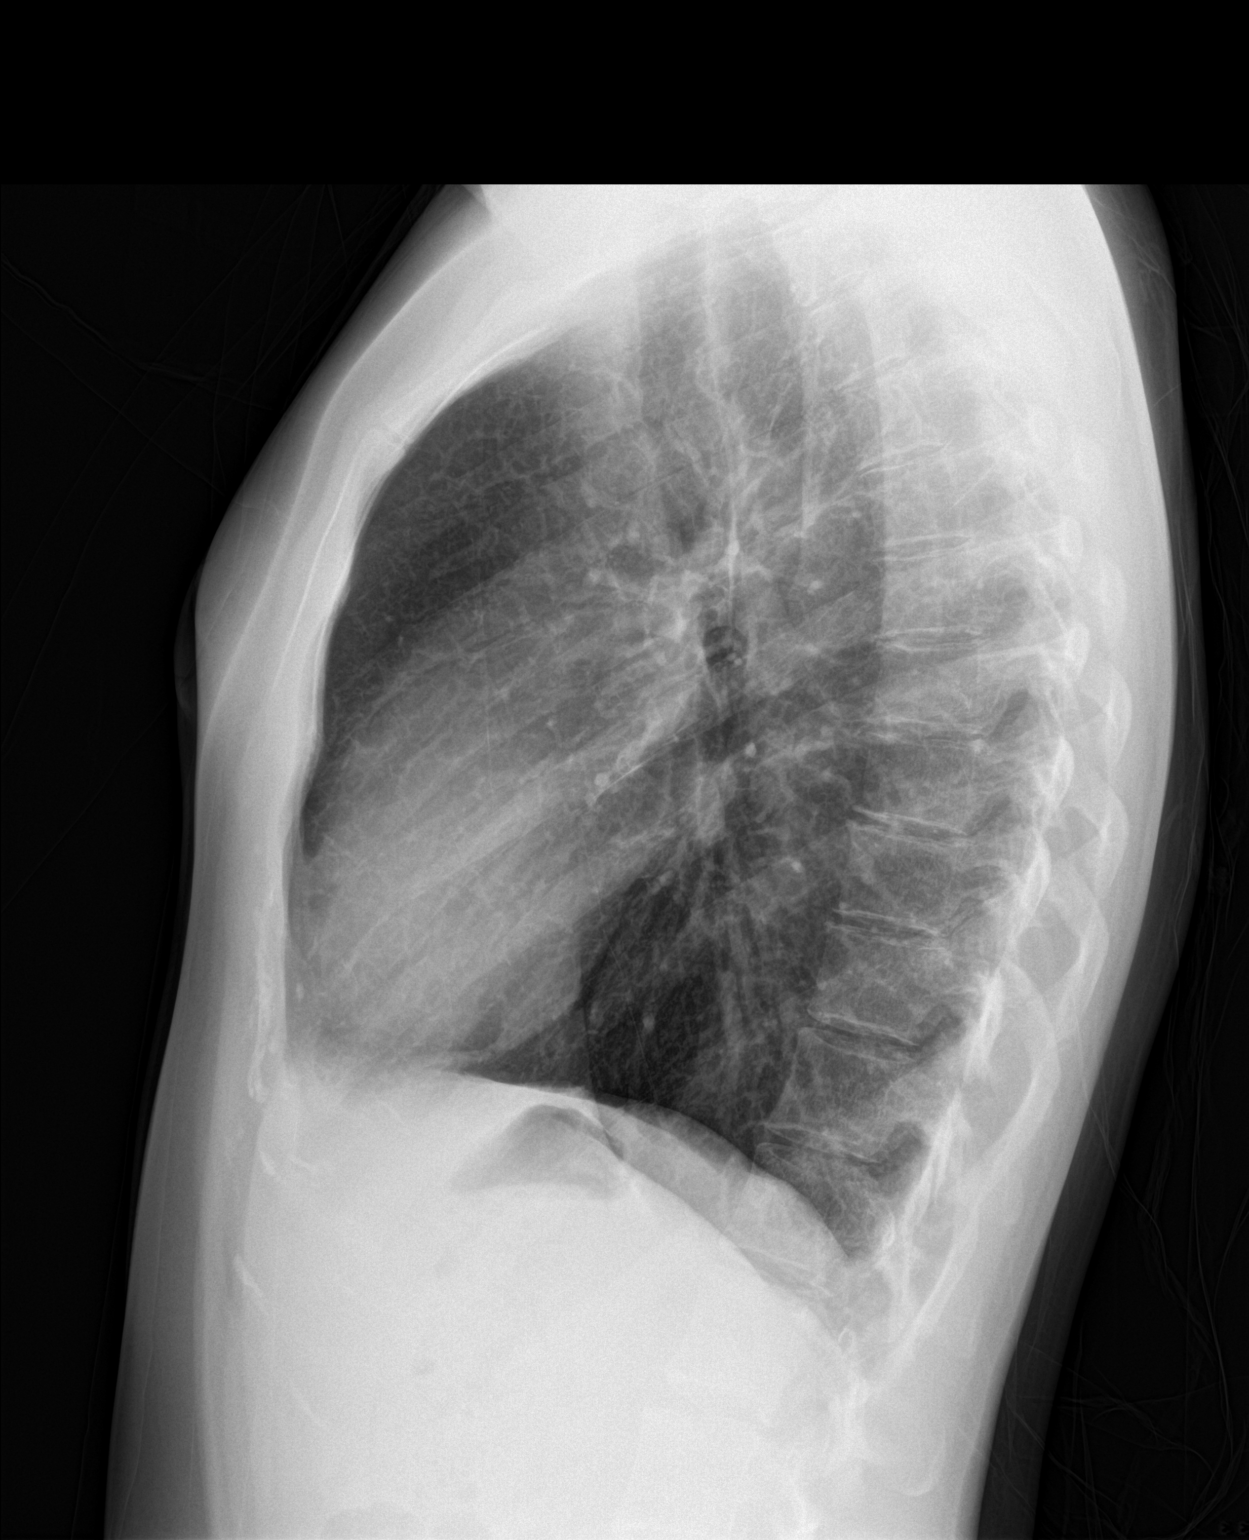

[2 of 2 positions shown; findings below may reference images not displayed]

FINDINGS: The lungs are well-aerated and clear. There is no evidence of focal
opacification, pleural effusion or pneumothorax.

The heart is normal in size; the mediastinal contour is within
normal limits. No acute osseous abnormalities are seen.
IMPRESSION: No acute cardiopulmonary process seen.

## 2017-10-30 ENCOUNTER — Telehealth: Payer: Self-pay | Admitting: Gastroenterology

## 2017-10-30 NOTE — Telephone Encounter (Signed)
Arbie Cookey from [facility] left vm she states she received a referral that states Evalution for pt but she needs to know more information about if pt has had other testing done please call her at  [phone]

## 2017-11-08 NOTE — Telephone Encounter (Signed)
Sheila Benton not in at the moment, she will call if anything further is needed.

## 2017-11-11 NOTE — Telephone Encounter (Signed)
I called and spoke with Sheila Benton at [facility] regarding referral today. The referral has been to different GI departments. She asked if the pt had an EGD, yes and needed the report and said the patient was suggested to have POEM but the referral was for surgery for malrotation of the bowel so she will send the referral to surgery in the am and also not sure why the POEM was brought up. I will let pt know via my chart what is going on. Once gotten to the right place, hopefully it will not be long before she has an appointment.

## 2017-11-11 NOTE — Progress Notes (Deleted)
[facility]  PATIENT: Sheila Benton DOB: [birth year]   REASON FOR VISIT: Follow-up for narcolepsy HISTORY FROM: Patient and husband [name]    HISTORY OF PRESENT ILLNESS:UPDATE 5/21/2019CM Sheila Benton, 37 year old female returns for follow-up with history of narcolepsy.  When last seen by Dr. Brett Fairy she was placed on Nuvigil 250 mg.  Patient states that her daytime sleepiness is much improved on the medication however it increased her anxiety and she stopped it 1 week ago. She did not try a half dose as  Dr. Brett Fairy had recommended if her anxiety increased ESS score dropped to 8 on the medication.  And fatigue severity index 45.  Both of these are much improved patient returns for reevaluation.  Patient states she has no desire to go on Xyrem.   04/18/17 CDI have the pleasure of meeting today on 18 April 2017 with Mr. and Sheila Benton.  Sheila Benton underwent a sleep study on 03 April 2017 first a baseline polysomnography based on her concern of excessive daytime sleepiness.  She had endorsed the Epworth Sleepiness Scale at 19 out of 24 points.  The sleep study showed negligible amount of apnea, no periodic limb movements no abnormality in heart rate or oxygen saturation.  An M SLT test followed the following day on 04 April 2017.  The patient had 2 REM sleep onsets and for naps.  Her mean sleep latency over 4 naps was 2.1 minutes.  REM sleep latency was 7.25 minutes.  This M SLT is diagnostic for the condition of narcolepsy.  We are now meeting to see how to best treat Sheila Benton today's Epworth score was endorsed at 17 out of 24 points there is still a high degree of fatigue he was 52 out of 59 points endorsed on the fatigue severity score. She is pathologically sleepy today, after a night time sleep of over 9 hours.  She recently had a bout of migraines- 3-4 since last Friday. Has sinusitis and rhinitis.    Sheila Benton is a 37 year old Caucasian mother of  3, right-handed, living with 3 dogs.  She reports having been sleepier than her peers all her life, never having difficulties to sleep in any situation, location and usually feeling that she sleeps a little too much.  She does have asthma since childhood and respiratory as well as some skin allergies.  Occasionally she will be short of breath, coughing and wheezing but this is not a nightly occurrence and she does not feel that it explains her nonrestorative non-refreshing sleep.  She also suffers from anxiety and fatigue, status post hysterectomy on 24 Jul 2016 and tubal ligation on 01 December 2003.  Chief complaint according to patient :" I am just so sleepy"- ' I am anxious but not depressed "  Sleep habits are as follows: She usually goes to bed between 10 and 11 PM and is asleep promptly.  Her bedroom is conducive to sleep, quiet, dark ,cool. She wakes up 2-3 times at night and usually goes to the bathroom, but it is not the urge to urinate that wakes her in the first place.  She has a bedroom with her husband and 2 dogs.  She is a side sleeper and sleeps on 2 pillows, but has a flat mattress and is not adjustable. Her husband has been snoring, but has not reported or witnessed her to do the same.  She considers her sleep deep and sound, she is not easily aroused.  She worries a  lot but it does not keep her from going to sleep. After each bathroom break she is promptly asleep again, she reports vivid dreams sometimes several times at night, and sometimes the story continues in different installments.  She has never experienced sleep paralysis, she is not sure that she dreams during short naps, and reports no dream intrusion or hypnagogic hallucination. She wakes up in the morning around 7 AM but by that time her husband's alarm has already rang several times.  She does not feel necessarily refreshed but she does not feel an energy surge in the morning either.  She naps almost every day -often  inadvertently, she falls asleep in different situations if she is not physically active or mentally stimulated.  She always gets 8 hours of nocturnal sleep if not more.  REVIEW OF SYSTEMS: Full 14 system review of systems performed and notable only for those listed, all others are neg:  Constitutional: neg  Cardiovascular: neg Ear/Nose/Throat: neg  Skin: neg Eyes: neg Respiratory: neg Gastroitestinal: neg  Hematology/Lymphatic: neg  Endocrine: neg Musculoskeletal:neg Allergy/Immunology: neg Neurological: neg Psychiatric: neg Sleep : Narcolepsy   ALLERGIES: Allergies  Allergen Reactions  . Armodafinil Other (See Comments)    "Shoulder ticks, back pain, increased anxiety, keeps me awake"  . Contrast Media [Iodinated Diagnostic Agents] Rash  . Omnipaque [Iohexol] Itching    Patient approximately 5 minutes after injection sneezed once and then started itching uncontrollably all over. SPM    HOME MEDICATIONS: Outpatient Medications Prior to Visit  Medication Sig Dispense Refill  . albuterol (PROVENTIL HFA;VENTOLIN HFA) 108 (90 Base) MCG/ACT inhaler INHALE 1 PUFF INTO THE LUNGS EVERY 6 HOURS AS NEEDED FOR WHEEZING OR SHORTNESS OF BREATH (Patient not taking: Reported on 10/07/2017) 8.5 g 0  . albuterol (PROVENTIL) (2.5 MG/3ML) 0.083% nebulizer solution VVN Q 6 H PRF WHZ OR SOB  3  . Armodafinil 250 MG tablet Take 1 tablet (250 mg total) by mouth daily. (Patient not taking: Reported on 10/07/2017) 30 tablet 5  . fluticasone furoate-vilanterol (BREO ELLIPTA) 200-25 MCG/INH AEPB Inhale 1 puff into the lungs daily. 60 each 12  . naproxen (NAPROSYN) 500 MG tablet TAKE 1 TABLET(500 MG) BY MOUTH TWICE DAILY WITH A MEAL 60 tablet 0  . omeprazole (PRILOSEC) 20 MG capsule Take 20 mg by mouth daily.    . VENTOLIN HFA 108 (90 Base) MCG/ACT inhaler INHALE 1 TO 2 PUFFS INTO THE LUNGS EVERY 6 HOURS AS NEEDED FOR WHEEZING OR SHORTNESS OF BREATH 18 g 0   No facility-administered medications prior to  visit.     PAST MEDICAL HISTORY: Past Medical History:  Diagnosis Date  . ADD (attention deficit disorder)   . Anemia   . Anxiety   . Asthma    WELL CONTROLLED  . Headache    MIGRAINES  . History of kidney stones   . Irregular menses     PAST SURGICAL HISTORY: Past Surgical History:  Procedure Laterality Date  . ABDOMINAL HYSTERECTOMY    . CYSTOSCOPY  07/24/2016   Procedure: CYSTOSCOPY;  Surgeon: Gae Dry, MD;  Location: [facility];  Service: Gynecology;;  . ESOPHAGOGASTRODUODENOSCOPY (EGD) WITH PROPOFOL N/A 09/20/2017   Procedure: ESOPHAGOGASTRODUODENOSCOPY (EGD) WITH PROPOFOL;  Surgeon: Virgel Manifold, MD;  Location: [facility];  Service: Endoscopy;  Laterality: N/A;  . LAPAROSCOPIC BILATERAL SALPINGECTOMY Bilateral 07/24/2016   Procedure: LAPAROSCOPIC BILATERAL SALPINGECTOMY;  Surgeon: Gae Dry, MD;  Location: [facility];  Service: Gynecology;  Laterality: Bilateral;  . LAPAROSCOPIC HYSTERECTOMY N/A  07/24/2016   Procedure: HYSTERECTOMY TOTAL LAPAROSCOPIC;  Surgeon: Gae Dry, MD;  Location: [facility];  Service: Gynecology;  Laterality: N/A;  . TUBAL LIGATION  12/24/2003    FAMILY HISTORY: Family History  Problem Relation Age of Onset  . Hypertension Mother   . Endometriosis Sister   . Heart disease Maternal Grandmother   . Hypertension Maternal Grandmother   . Stroke Maternal Grandmother   . Lung cancer Maternal Grandfather     SOCIAL HISTORY: Social History   Socioeconomic History  . Marital status: Married    Spouse name: Not on file  . Number of children: Not on file  . Years of education: Not on file  . Highest education level: Not on file  Occupational History  . Not on file  Social Needs  . Financial resource strain: Not on file  . Food insecurity:    Worry: Not on file    Inability: Not on file  . Transportation needs:    Medical: Not on file    Non-medical: Not on file  Tobacco Use  . Smoking status: Former Research scientist (life sciences)  .  Smokeless tobacco: Never Used  Substance and Sexual Activity  . Alcohol use: No  . Drug use: No  . Sexual activity: Yes    Comment: Tubes Tied  Lifestyle  . Physical activity:    Days per week: Not on file    Minutes per session: Not on file  . Stress: Not on file  Relationships  . Social connections:    Talks on phone: Not on file    Gets together: Not on file    Attends religious service: Not on file    Active member of club or organization: Not on file    Attends meetings of clubs or organizations: Not on file    Relationship status: Not on file  . Intimate partner violence:    Fear of current or ex partner: Not on file    Emotionally abused: Not on file    Physically abused: Not on file    Forced sexual activity: Not on file  Other Topics Concern  . Not on file  Social History Narrative  . Not on file     PHYSICAL EXAM  There were no vitals filed for this visit. There is no height or weight on file to calculate BMI.  Generalized: Well developed, in no acute distress  Head: normocephalic and atraumatic,. Oropharynx benign  Neck: Supple,  Musculoskeletal: No deformity   Neurological examination   Mentation: Alert oriented to time, place, history taking. Attention span and concentration appropriate. Recent and remote memory intact.  Follows all commands speech and language fluent.   Cranial nerve II-XII: Pupils were equal round reactive to light extraocular movements were full, visual field were full on confrontational test. Facial sensation and strength were normal. hearing was intact to finger rubbing bilaterally. Uvula tongue midline. head turning and shoulder shrug were normal and symmetric.Tongue protrusion into cheek strength was normal. Motor: normal bulk and tone, full strength in the BUE, BLE,  Sensory: normal and symmetric to light touch, Coordination: finger-nose-finger, heel-to-shin bilaterally, no dysmetria Gait and Station: Rising up from seated  position without assistance, normal stance,  moderate stride, good arm swing, smooth turning, able to perform tiptoe, and heel walking without difficulty. Tandem gait is steady  DIAGNOSTIC DATA (LABS, IMAGING, TESTING) - I reviewed patient records, labs, notes, testing and imaging myself where available.  Lab Results  Component Value Date   WBC 7.3  05/06/2017   HGB 12.1 05/06/2017   HCT 38.7 05/06/2017   MCV 91 05/06/2017   PLT 348 05/06/2017      Component Value Date/Time   NA 140 05/06/2017 0934   NA 136 02/06/2013 2146   K 3.6 05/06/2017 0934   K 3.4 (L) 02/06/2013 2146   CL 104 05/06/2017 0934   CL 105 02/06/2013 2146   CO2 23 05/06/2017 0934   CO2 27 02/06/2013 2146   GLUCOSE 72 05/06/2017 0934   GLUCOSE 103 (H) 02/06/2013 2146   BUN 7 05/06/2017 0934   BUN 10 02/06/2013 2146   CREATININE 0.61 05/06/2017 0934   CREATININE 0.84 02/06/2013 2146   CALCIUM 9.1 05/06/2017 0934   CALCIUM 9.4 02/06/2013 2146   PROT 6.4 05/06/2017 0934   PROT 7.5 02/06/2013 2146   ALBUMIN 4.1 05/06/2017 0934   ALBUMIN 3.8 02/06/2013 2146   AST 14 05/06/2017 0934   AST 16 02/06/2013 2146   ALT 8 05/06/2017 0934   ALT 13 02/06/2013 2146   ALKPHOS 70 05/06/2017 0934   ALKPHOS 94 02/06/2013 2146   BILITOT <0.2 05/06/2017 0934   BILITOT 0.6 02/06/2013 2146   GFRNONAA 115 05/06/2017 0934   GFRNONAA >60 02/06/2013 2146   GFRAA 133 05/06/2017 0934   GFRAA >60 02/06/2013 2146   Lab Results  Component Value Date   CHOL 156 05/06/2017   HDL 42 05/06/2017   LDLCALC 94 05/06/2017   TRIG 100 05/06/2017    Lab Results  Component Value Date   TSH 2.350 05/06/2017      ASSESSMENT AND PLAN  37 y.o. year old female  has a past medical history of ADD (attention deficit disorder), Anemia, Anxiety, Asthma, Headache, History of kidney stones, and Irregular menses.  And narcolepsy here to follow-up for her narcolepsy.  She got anxious on the 250 mg of Nuvigil and stopped taking it about a week  ago.  She did not try 1/2 tablet.ESS 8 FSS 45 . Pt is not interested in Xyrem.   PLAN: Decrease Nuvigil to 1/2 tab daily for 1 month then full tab.  Follow up in 3 months Dennie Bible, [facility], [facility], APRN  [facility] [address], [place], [place] [address] [phone]

## 2017-11-12 ENCOUNTER — Telehealth: Payer: Self-pay | Admitting: *Deleted

## 2017-11-12 ENCOUNTER — Ambulatory Visit: Payer: BLUE CROSS/BLUE SHIELD | Admitting: Nurse Practitioner

## 2017-11-12 NOTE — Telephone Encounter (Signed)
Patient was no show for follow up with NP today.  

## 2017-11-13 ENCOUNTER — Encounter: Payer: Self-pay | Admitting: Nurse Practitioner

## 2017-11-20 ENCOUNTER — Other Ambulatory Visit: Payer: Self-pay | Admitting: Family Medicine

## 2017-11-20 DIAGNOSIS — M7540 Impingement syndrome of unspecified shoulder: Secondary | ICD-10-CM | POA: Insufficient documentation

## 2017-11-20 DIAGNOSIS — M542 Cervicalgia: Secondary | ICD-10-CM | POA: Insufficient documentation

## 2018-01-01 DIAGNOSIS — Q433 Congenital malformations of intestinal fixation: Secondary | ICD-10-CM | POA: Insufficient documentation

## 2018-01-07 ENCOUNTER — Ambulatory Visit: Payer: BLUE CROSS/BLUE SHIELD | Admitting: Gastroenterology

## 2018-01-07 ENCOUNTER — Encounter: Payer: Self-pay | Admitting: *Deleted

## 2018-01-07 DIAGNOSIS — R1084 Generalized abdominal pain: Secondary | ICD-10-CM

## 2018-03-11 ENCOUNTER — Other Ambulatory Visit: Payer: Self-pay | Admitting: Family Medicine

## 2018-03-11 NOTE — Telephone Encounter (Signed)
Requested Prescriptions  Pending Prescriptions Disp Refills  . VENTOLIN HFA 108 (90 Base) MCG/ACT inhaler [Pharmacy Med Name: VENTOLIN HFA INH W/DOS CTR 200PUFFS] 18 g 0    Sig: INHALE 1 TO 2 PUFFS INTO THE LUNGS EVERY 6 HOURS AS NEEDED FOR WHEEZING OR SHORTNESS OF BREATH     Pulmonology:  Beta Agonists Failed - 03/11/2018 10:41 AM      Failed - One inhaler should last at least one month. If the patient is requesting refills earlier, contact the patient to check for uncontrolled symptoms.      Passed - Valid encounter within last 12 months    Recent Outpatient Visits          10 months ago Routine general medical examination at a health care facility   [facility], [place] P, DO   11 months ago Acute sinusitis, recurrence not specified, unspecified location   [facility], Jeannette How, MD   1 year ago Moderate persistent asthma with acute exacerbation   [facility], [place]   1 year ago Chronic fatigue   [place], [place], DO   1 year ago Generalized anxiety disorder   [place], [place], DO      Future Appointments            In 2 months Johnson, Megan P, DO [place], [facility]          Filled until appt in 2 months

## 2018-03-13 ENCOUNTER — Telehealth: Payer: Self-pay | Admitting: Family Medicine

## 2018-03-13 NOTE — Telephone Encounter (Signed)
Copied from [place]. Topic: General - Other >> Mar 13, 2018  1:29 PM Oneta Rack wrote: Osvaldo Human name: Curt Bears  Relation to pt: [facility] Pharmacist  Call back number: [phone]  Pharmacy: [place], [address] AT [place] [phone] (Phone) [phone] (Fax)  Reason for call:  Insurance will  not cover VENTOLIN HFA 108 (90 Base) MCG/ACT inhaler and pharmacist unable to change due to rx stating denspense as written, pharamcist requesting verbal orders for pro air, please advise

## 2018-03-13 NOTE — Telephone Encounter (Signed)
OK to fill whatever is covered.

## 2018-03-13 NOTE — Telephone Encounter (Signed)
Left message on machine for pharmacy given verbal okay to switch medication.

## 2018-04-21 ENCOUNTER — Other Ambulatory Visit: Payer: Self-pay

## 2018-04-21 ENCOUNTER — Ambulatory Visit
Admission: EM | Admit: 2018-04-21 | Discharge: 2018-04-21 | Disposition: A | Discharge status: Home / Self Care | Payer: Managed Care, Other (non HMO) | Attending: Family Medicine | Admitting: Family Medicine

## 2018-04-21 DIAGNOSIS — Z87891 Personal history of nicotine dependence: Secondary | ICD-10-CM | POA: Diagnosis not present

## 2018-04-21 DIAGNOSIS — R05 Cough: Secondary | ICD-10-CM

## 2018-04-21 DIAGNOSIS — H6502 Acute serous otitis media, left ear: Secondary | ICD-10-CM | POA: Diagnosis not present

## 2018-04-21 DIAGNOSIS — R059 Cough, unspecified: Secondary | ICD-10-CM

## 2018-04-21 MED ORDER — HYDROCOD POLST-CPM POLST ER 10-8 MG/5ML PO SUER: 5.0000 mL | Freq: Two times a day (BID) | ORAL | 0 refills | Status: DC | PRN

## 2018-04-21 MED ORDER — AMOXICILLIN 875 MG PO TABS: 875.0000 mg | ORAL_TABLET | Freq: Two times a day (BID) | ORAL | 0 refills | Status: DC

## 2018-04-21 NOTE — ED Provider Notes (Signed)
[facility]    CSN: [id number] Arrival date & time: 04/21/18  0845     History   Chief Complaint Chief Complaint  Patient presents with  . Cough    HPI Sheila Benton is a 38 y.o. female.   The history is provided by the patient.  Cough  Associated symptoms: ear pain and rhinorrhea   Associated symptoms: no wheezing   URI  Presenting symptoms: congestion, cough, ear pain, facial pain and rhinorrhea   Severity:  Moderate Duration:  5 days Timing:  Constant Progression:  Worsening Chronicity:  New Relieved by:  Nothing Ineffective treatments:  OTC medications Associated symptoms: no wheezing   Risk factors: sick contacts     Past Medical History:  Diagnosis Date  . ADD (attention deficit disorder)   . Anemia   . Anxiety   . Asthma    WELL CONTROLLED  . Headache    MIGRAINES  . History of kidney stones   . Irregular menses     Patient Active Problem List   Diagnosis Date Noted  . Stomach irritation   . Hiatal hernia   . Abdominal pain, generalized   . Intractable vomiting with nausea   . Narcolepsy 04/15/2017  . Abnormal weight gain 03/25/2017  . Hypersomnia, persistent 01/23/2017  . Excessive daytime sleepiness 01/23/2017  . Fatigue 12/20/2016  . Endometriosis 07/24/2016  . Pelvic pain in female 07/24/2016  . 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  . Anxiety disorder 03/10/2015  . Asthma 02/10/2015    Past Surgical History:  Procedure Laterality Date  . ABDOMINAL HYSTERECTOMY    . CYSTOSCOPY  07/24/2016   Procedure: CYSTOSCOPY;  Surgeon: Gae Dry, MD;  Location: [facility];  Service: Gynecology;;  . ESOPHAGOGASTRODUODENOSCOPY (EGD) WITH PROPOFOL N/A 09/20/2017   Procedure: ESOPHAGOGASTRODUODENOSCOPY (EGD) WITH PROPOFOL;  Surgeon: Virgel Manifold, MD;  Location: [facility];  Service: Endoscopy;  Laterality: N/A;  . LAPAROSCOPIC BILATERAL SALPINGECTOMY Bilateral 07/24/2016   Procedure: LAPAROSCOPIC BILATERAL  SALPINGECTOMY;  Surgeon: Gae Dry, MD;  Location: [facility];  Service: Gynecology;  Laterality: Bilateral;  . LAPAROSCOPIC HYSTERECTOMY N/A 07/24/2016   Procedure: HYSTERECTOMY TOTAL LAPAROSCOPIC;  Surgeon: Gae Dry, MD;  Location: [facility];  Service: Gynecology;  Laterality: N/A;  . TUBAL LIGATION  12/24/2003    OB History    Gravida  3   Para  3   Term  3   Preterm      AB      Living  3     SAB      TAB      Ectopic      Multiple      Live Births  3            Home Medications    Prior to Admission medications   Medication Sig Start Date End Date Taking? Authorizing Provider  albuterol (PROVENTIL HFA;VENTOLIN HFA) 108 (90 Base) MCG/ACT inhaler INHALE 1 PUFF INTO THE LUNGS EVERY 6 HOURS AS NEEDED FOR WHEEZING OR SHORTNESS OF BREATH 08/27/17  Yes Volney American, PA-C  albuterol (PROVENTIL) (2.5 MG/3ML) 0.083% nebulizer solution VVN Q 6 H PRF WHZ OR SOB 02/05/17  Yes [provider]  fluticasone furoate-vilanterol (BREO ELLIPTA) 200-25 MCG/INH AEPB Inhale 1 puff into the lungs daily. 05/06/17  Yes Johnson, Megan P, DO  naproxen (NAPROSYN) 500 MG tablet TAKE 1 TABLET(500 MG) BY MOUTH TWICE DAILY WITH A MEAL 08/27/17  Yes Nyra Capes  VENTOLIN HFA 108 [phone]  Base) MCG/ACT inhaler INHALE 1 TO 2 PUFFS INTO THE LUNGS EVERY 6 HOURS AS NEEDED FOR WHEEZING OR SHORTNESS OF BREATH 03/11/18  Yes Johnson, Megan P, DO  amoxicillin (AMOXIL) 875 MG tablet Take 1 tablet (875 mg total) by mouth 2 (two) times daily. 04/21/18   Norval Gable, MD  Armodafinil 250 MG tablet Take 1 tablet (250 mg total) by mouth daily. Patient not taking: Reported on 10/07/2017 04/18/17   Dohmeier, Asencion Partridge, MD  chlorpheniramine-HYDROcodone [facility]) 10-8 MG/5ML SUER Take 5 mLs by mouth every 12 (twelve) hours as needed. 04/21/18   Norval Gable, MD  omeprazole (PRILOSEC) 20 MG capsule Take 20 mg by mouth daily.    [provider]    Family  History Family History  Problem Relation Age of Onset  . Hypertension Mother   . Endometriosis Sister   . Heart disease Maternal Grandmother   . Hypertension Maternal Grandmother   . Stroke Maternal Grandmother   . Lung cancer Maternal Grandfather     Social History Social History   Tobacco Use  . Smoking status: Former Research scientist (life sciences)  . Smokeless tobacco: Never Used  Substance Use Topics  . Alcohol use: No  . Drug use: No     Allergies   Armodafinil; Contrast media [iodinated diagnostic agents]; and Omnipaque [iohexol]   Review of Systems Review of Systems  HENT: Positive for congestion, ear pain and rhinorrhea.   Respiratory: Positive for cough. Negative for wheezing.      Physical Exam Triage Vital Signs ED Triage Vitals  Enc Vitals Group     BP 04/21/18 0902 109/79     Pulse Rate 04/21/18 0902 83     Resp 04/21/18 0902 18     Temp 04/21/18 0902 97.7 F (36.5 C)     Temp Source 04/21/18 0902 Oral     SpO2 04/21/18 0902 97 %     Weight 04/21/18 0900 157 lb (71.2 kg)     Height 04/21/18 0900 5\' 6"  (1.676 m)     Head Circumference --      Peak Flow --      Pain Score 04/21/18 0900 8     Pain Loc --      Pain Edu? --      Excl. in [place]? --    No data found.  Updated Vital Signs BP 109/79 (BP Location: Right Arm)   Pulse 83   Temp 97.7 F (36.5 C) (Oral)   Resp 18   Ht 5\' 6"  (1.676 m)   Wt 71.2 kg   LMP 07/13/2016 (Exact Date)   SpO2 97%   BMI 25.34 kg/m   Visual Acuity Right Eye Distance:   Left Eye Distance:   Bilateral Distance:    Right Eye Near:   Left Eye Near:    Bilateral Near:     Physical Exam Vitals signs and nursing note reviewed.  Constitutional:      General: She is not in acute distress.    Appearance: She is well-developed. She is not diaphoretic.  HENT:     Head: Normocephalic and atraumatic.     Right Ear: Tympanic membrane, ear canal and external ear normal.     Left Ear: Ear canal and external ear normal. A middle ear  effusion is present. Tympanic membrane is erythematous and bulging.     Nose: Mucosal edema and rhinorrhea present. No nasal deformity, septal deviation or laceration.     Mouth/Throat:     Pharynx: Uvula midline. No  oropharyngeal exudate.  Eyes:     General: No scleral icterus.       Right eye: No discharge.        Left eye: No discharge.     Conjunctiva/sclera: Conjunctivae normal.     Pupils: Pupils are equal, round, and reactive to light.  Neck:     Musculoskeletal: Normal range of motion and neck supple.     Thyroid: No thyromegaly.  Cardiovascular:     Rate and Rhythm: Normal rate and regular rhythm.     Heart sounds: Normal heart sounds.  Pulmonary:     Effort: Pulmonary effort is normal. No respiratory distress.     Breath sounds: Normal breath sounds. No wheezing or rales.  Lymphadenopathy:     Cervical: No cervical adenopathy.      UC Treatments / Results  Labs (all labs ordered are listed, but only abnormal results are displayed) Labs Reviewed - No data to display  EKG None  Radiology No results found.  Procedures Procedures (including critical care time)  Medications Ordered in UC Medications - No data to display  Initial Impression / Assessment and Plan / UC Course  I have reviewed the triage vital signs and the nursing notes.  Pertinent labs & imaging results that were available during my care of the patient were reviewed by me and considered in my medical decision making (see chart for details).      Final Clinical Impressions(s) / UC Diagnoses   Final diagnoses:  Acute serous otitis media of left ear, recurrence not specified  Cough     Discharge Instructions     Flonase nasal spray sudafed    ED Prescriptions    Medication Sig Dispense Auth. Provider   amoxicillin (AMOXIL) 875 MG tablet Take 1 tablet (875 mg total) by mouth 2 (two) times daily. 20 tablet Norval Gable, MD   chlorpheniramine-HYDROcodone [facility])  10-8 MG/5ML SUER Take 5 mLs by mouth every 12 (twelve) hours as needed. 60 mL Norval Gable, MD     1. diagnosis reviewed with patient 2. rx as per orders above; reviewed possible side effects, interactions, risks and benefits  3. Recommend supportive treatment as above  4. Follow-up prn if symptoms worsen or don't improve   Controlled Substance Prescriptions [place] Controlled Substance Registry consulted? Not Applicable   Norval Gable, MD 04/21/18 1032

## 2018-04-21 NOTE — ED Triage Notes (Signed)
Patient complains of cough, sinus pain and pressure and congestion. States that this started on Thursday and has been constant and worsening, states that she has tried multiple OTC medications.

## 2018-04-21 NOTE — Discharge Instructions (Addendum)
Flonase nasal spray sudafed

## 2018-05-13 ENCOUNTER — Encounter: Payer: Managed Care, Other (non HMO) | Admitting: Family Medicine

## 2018-05-18 ENCOUNTER — Other Ambulatory Visit: Payer: Self-pay | Admitting: Family Medicine

## 2018-05-19 NOTE — Telephone Encounter (Signed)
Patient called and advised she will need to schedule a physical with Dr. Wynetta Emery in order to refill her requested medication. She says she is not wanting to come to the office with all this coronavirus, she has asthma and doesn't want to be exposed. I expressed understanding and advised I could schedule whenever she wants, as long as there is an appointment, she will get the refill. Patient requests an appointment for morning in May, appointment scheduled for Thursday, 07/24/18 at 1000 with Dr. Wynetta Emery for a physical.

## 2018-05-19 NOTE — Telephone Encounter (Signed)
Requested medication (s) are due for refill today: Yes  Requested medication (s) are on the active medication list: Yes  Last refill:  05/06/17  Future visit scheduled: Yes  Notes to clinic:  Expired Rx, unable to refill     Requested Prescriptions  Pending Prescriptions Disp Refills   BREO ELLIPTA 200-25 MCG/INH AEPB [Pharmacy Med Name: BREO ELLIPTA 200-25MCG ORAL INH(30)] 60 each 12    Sig: INHALE 1 PUFF INTO THE LUNGS DAILY     Pulmonology:  [employer] Failed - 05/19/2018 10:18 AM      Failed - Valid encounter within last 12 months    Recent Outpatient Visits          1 year ago Routine general medical examination at a health care facility   [facility], [place] P, DO   1 year ago Acute sinusitis, recurrence not specified, unspecified location   [facility] Cr[place], Jeannette How, MD   1 year ago Moderate persistent asthma with acute exacerbation   [facility], [place]   1 year ago Chronic fatigue   [place], [place], DO   1 year ago Generalized anxiety disorder   [facility], Barb Merino, DO      Future Appointments            In 2 months Wynetta Emery, Barb Merino, DO [employer], [place]

## 2018-05-19 NOTE — Telephone Encounter (Signed)
Called pt and advised that refill was approved

## 2018-06-28 ENCOUNTER — Other Ambulatory Visit: Payer: Self-pay | Admitting: Family Medicine

## 2018-07-23 ENCOUNTER — Telehealth: Payer: Self-pay | Admitting: Family Medicine

## 2018-07-23 NOTE — Telephone Encounter (Signed)
Called pt to reschedule physical no answer left voicemail. Also sent MyChart message to reschedule.

## 2018-07-24 ENCOUNTER — Encounter: Payer: Self-pay | Admitting: Family Medicine

## 2018-10-14 ENCOUNTER — Telehealth: Payer: Self-pay | Admitting: Gastroenterology

## 2018-10-14 NOTE — Telephone Encounter (Signed)
Left vm to offer apt  °

## 2018-10-15 ENCOUNTER — Other Ambulatory Visit: Payer: Self-pay

## 2018-10-15 ENCOUNTER — Ambulatory Visit (INDEPENDENT_AMBULATORY_CARE_PROVIDER_SITE_OTHER): Payer: Managed Care, Other (non HMO) | Admitting: Gastroenterology

## 2018-10-15 ENCOUNTER — Encounter: Payer: Self-pay | Admitting: Gastroenterology

## 2018-10-15 VITALS — BP 118/75 | HR 101 | Temp 97.5°F | Ht 66.0 in | Wt 169.8 lb

## 2018-10-15 DIAGNOSIS — K219 Gastro-esophageal reflux disease without esophagitis: Secondary | ICD-10-CM | POA: Diagnosis not present

## 2018-10-15 DIAGNOSIS — Q433 Congenital malformations of intestinal fixation: Secondary | ICD-10-CM

## 2018-10-15 MED ORDER — OMEPRAZOLE 20 MG PO CPDR: 20.0000 mg | DELAYED_RELEASE_CAPSULE | Freq: Two times a day (BID) | ORAL | 0 refills | Status: DC

## 2018-10-15 NOTE — Progress Notes (Signed)
Sheila Antigua, MD [address]  [place]  [place], [place] [address]  Main: [phone]  Fax: [phone]   Primary Care Physician: Valerie Roys, DO   Chief Complaint  Patient presents with  . Follow-up    Abdominal Pain-Improved as long as she takes omeprazole  . Weight Gain    HPI: Sheila Benton is a 38 y.o. female with chronic history of abdominal pain, bloating, here for follow-up. She has chronic malrotation deformity of the bowel loops noted on previous CT scans.  Due to her intermittent nausea and vomiting, and abdominal pain which can be explained by her chronic malrotation ([email or web address]) noted on 2017 and 2019 CT scans, surgical referral was placed and patient was seen at [facility]  They recommended a colonoscopy which was also done at [facility].  I do not have the procedure report but asked for brief notation in care everywhere, no polyps were removed and extent of exam was cecum.  Patient states the colonoscopy was normal.  Patient was supposed to have a follow-up with the surgeon, to discuss surgical options, but has not heard from them. She underwent EGD on September 20, 2017 with biopsies negative for H. pylori.  It showed gastric erythema, 1 cm hiatal hernia.  Patient reports improvement in GERD symptoms with omeprazole once daily, and states that GERD symptoms are well controlled, but she continues to have abdominal bloating right after eating.   Current Outpatient Medications  Medication Sig Dispense Refill  . albuterol (PROVENTIL HFA;VENTOLIN HFA) 108 (90 Base) MCG/ACT inhaler INHALE 1 PUFF INTO THE LUNGS EVERY 6 HOURS AS NEEDED FOR WHEEZING OR SHORTNESS OF BREATH 8.5 g 0  . albuterol (PROVENTIL) (2.5 MG/3ML) 0.083% nebulizer solution VVN Q 6 H PRF WHZ OR SOB  3  . BREO ELLIPTA 200-25 MCG/INH AEPB INHALE 1 PUFF INTO THE LUNGS DAILY 60 each 12  . omeprazole (PRILOSEC) 20 MG capsule Take 20 mg by mouth daily.    .  Armodafinil 250 MG tablet Take 1 tablet (250 mg total) by mouth daily. (Patient not taking: Reported on 10/07/2017) 30 tablet 5  . VENTOLIN HFA 108 (90 Base) MCG/ACT inhaler INHALE 1 TO 2 PUFFS INTO THE LUNGS EVERY 6 HOURS AS NEEDED FOR WHEEZING OR SHORTNESS OF BREATH (Patient not taking: Reported on 10/15/2018) 18 g 0   No current facility-administered medications for this visit.     Allergies as of 10/15/2018 - Review Complete 10/15/2018  Allergen Reaction Noted  . Armodafinil Other (See Comments) 07/16/2017  . Contrast media [iodinated diagnostic agents] Rash 01/07/2017  . Omnipaque [iohexol] Itching 04/26/2015    ROS:  General: Negative for anorexia, weight loss, fever, chills, fatigue, weakness. ENT: Negative for hoarseness, difficulty swallowing , nasal congestion. CV: Negative for chest pain, angina, palpitations, dyspnea on exertion, peripheral edema.  Respiratory: Negative for dyspnea at rest, dyspnea on exertion, cough, sputum, wheezing.  GI: See history of present illness. GU:  Negative for dysuria, hematuria, urinary incontinence, urinary frequency, nocturnal urination.  Endo: Negative for unusual weight change.    Physical Examination:   BP 118/75   Pulse (!) 101   Temp (!) 97.5 F (36.4 C)   Ht 5\' 6"  (1.676 m)   Wt 169 lb 12.8 oz (77 kg)   LMP 07/13/2016 (Exact Date)   BMI 27.41 kg/m   General: Well-nourished, well-developed in no acute distress.  Eyes: No icterus. Conjunctivae pink. Mouth: Oropharyngeal mucosa moist and pink , no lesions erythema or exudate. Neck: Supple,  Trachea midline Abdomen: Bowel sounds are normal, nontender, nondistended, no hepatosplenomegaly or masses, no abdominal bruits or hernia , no rebound or guarding.   Extremities: No lower extremity edema. No clubbing or deformities. Neuro: Alert and oriented x 3.  Grossly intact. Skin: Warm and dry, no jaundice.   Psych: Alert and cooperative, normal mood and affect.   Labs: CMP      Component Value Date/Time   NA 140 05/06/2017 0934   NA 136 02/06/2013 2146   K 3.6 05/06/2017 0934   K 3.4 (L) 02/06/2013 2146   CL 104 05/06/2017 0934   CL 105 02/06/2013 2146   CO2 23 05/06/2017 0934   CO2 27 02/06/2013 2146   GLUCOSE 72 05/06/2017 0934   GLUCOSE 103 (H) 02/06/2013 2146   BUN 7 05/06/2017 0934   BUN 10 02/06/2013 2146   CREATININE 0.61 05/06/2017 0934   CREATININE 0.84 02/06/2013 2146   CALCIUM 9.1 05/06/2017 0934   CALCIUM 9.4 02/06/2013 2146   PROT 6.4 05/06/2017 0934   PROT 7.5 02/06/2013 2146   ALBUMIN 4.1 05/06/2017 0934   ALBUMIN 3.8 02/06/2013 2146   AST 14 05/06/2017 0934   AST 16 02/06/2013 2146   ALT 8 05/06/2017 0934   ALT 13 02/06/2013 2146   ALKPHOS 70 05/06/2017 0934   ALKPHOS 94 02/06/2013 2146   BILITOT <0.2 05/06/2017 0934   BILITOT 0.6 02/06/2013 2146   GFRNONAA 115 05/06/2017 0934   GFRNONAA >60 02/06/2013 2146   GFRAA 133 05/06/2017 0934   GFRAA >60 02/06/2013 2146   Lab Results  Component Value Date   WBC 7.3 05/06/2017   HGB 12.1 05/06/2017   HCT 38.7 05/06/2017   MCV 91 05/06/2017   PLT 348 05/06/2017    Imaging Studies: No results found.  Assessment and Plan:   Sheila Benton is a 38 y.o. y/o female here for follow-up of abdominal pain, nausea vomiting, abdominal bloating, with chronic malrotation deformity of the bowel loops  Our clinic will try to reach out to Dr. [name] clinic to ensure she has proper follow-up with them if they were going to discuss surgical options after her colonoscopy which is now complete at [facility].  I have asked the patient to contact us in 2 to 3 weeks if she has not heard from them about an appointment  Patient reports improvement in GERD symptoms, but reports heartburn when she bends down, and also has abdominal bloating with no H. pylori on biopsies.  Dyspepsia can improve with PPI and therefore we can increase dosage for 30 days  (Risks of PPI use were discussed with patient including  bone loss, C. Diff diarrhea, pneumonia, infections, CKD, electrolyte abnormalities.  If clinically possible based on symptoms, goal would be to maintain patient on the lowest dose possible, or discontinue the medication with institution of acid reflux lifestyle modifications over time. Pt. Verbalizes understanding and chooses to continue the medication.)  Follow-up in 3 to 6 months   Dr Sheila Benton

## 2018-10-22 ENCOUNTER — Encounter: Payer: Self-pay | Admitting: Gastroenterology

## 2018-10-29 ENCOUNTER — Telehealth: Payer: Self-pay

## 2018-10-29 NOTE — Telephone Encounter (Signed)
Can you please call the patient and let them know that we have spoken to them, and they have recommended that if she would like to proceed with surgery, that she would need to make a follow-up appointment with them, and gave her their phone number as below and encourage her to follow-up with them.  Above is per Dr. Vito Backers Patient and patient verbalized understanding

## 2018-10-29 NOTE — Telephone Encounter (Signed)
Called Dr Ronita Hipps nurse and he stated they have not seen patient since 10/19. He is going to reach out to Dr. Ronita Hipps NP  and find out if they need to see her.

## 2018-10-29 NOTE — Telephone Encounter (Signed)
Per Dr. Ronita Hipps nurse he states that the patient never followed up with there gastro team because patient wanted a second opinion. If she does want to have this done she will need a follow up appointment with them and then they will scheduled the surgery.

## 2018-10-29 NOTE — Telephone Encounter (Signed)
-----   Message from Virgel Manifold, MD sent at 10/29/2018 12:18 PM EDT ----- We were supposed to recheck to Dr. [name] office after her last clinic visit to ask them if they can follow-up with her in regard to surgery for malrotation of her colon.  Can you please reach out to them and find out what their plans are and if they can please follow-up with her in this regard.  Gwinda Maine, MD  [facility] [place], [place] [address]  [phone]  [phone] (Fax)

## 2018-11-29 IMAGING — US US PELVIS COMPLETE
1 series · 14 of 25 positions shown · non-contrast
Comparison: Noncontrast CT Abdomen and Pelvis 02/07/2013

CLINICAL DATA: 36-year-old female with pelvic pain for 3 weeks,
vaginal bleeding for 12 days.



[Series 1: us pelvis complete · 0.18mm/px · 14 of 82 slices shown]
[im 1/82]
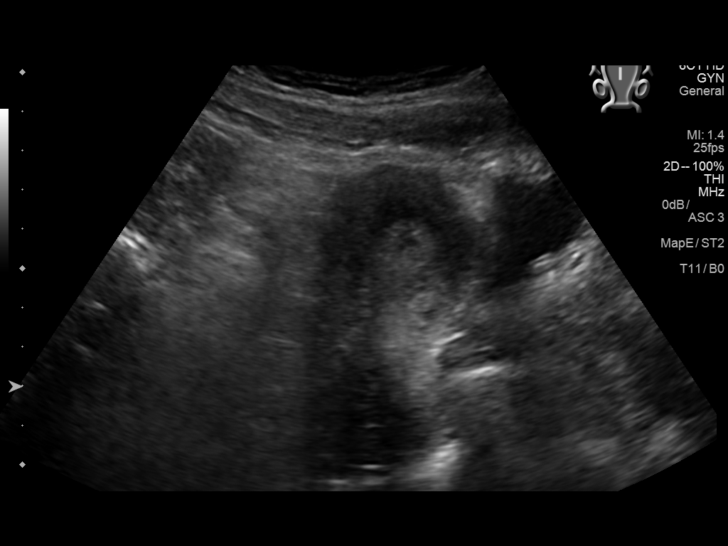
[im 7/82]
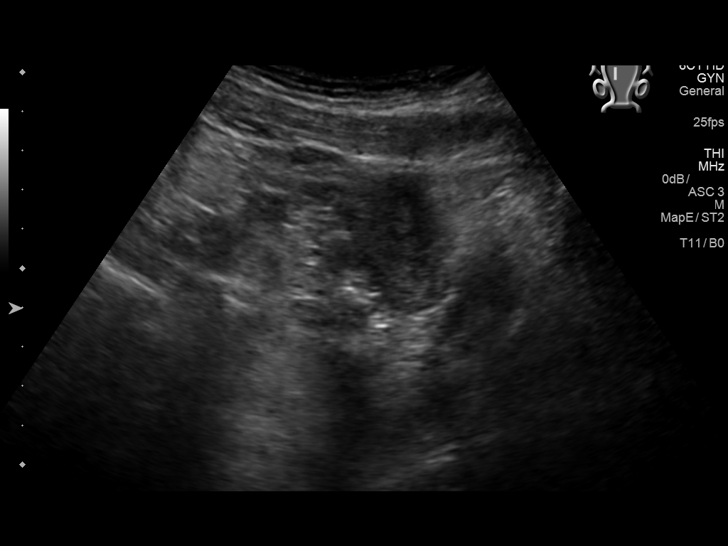
[im 14/82]
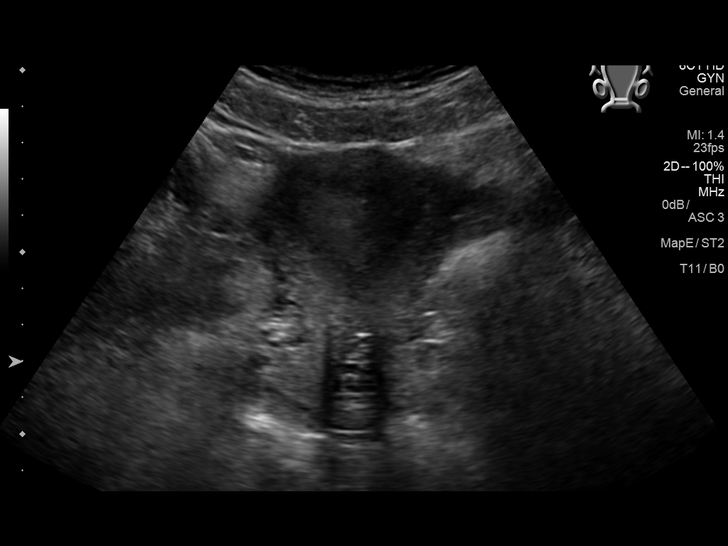
[im 21/82]
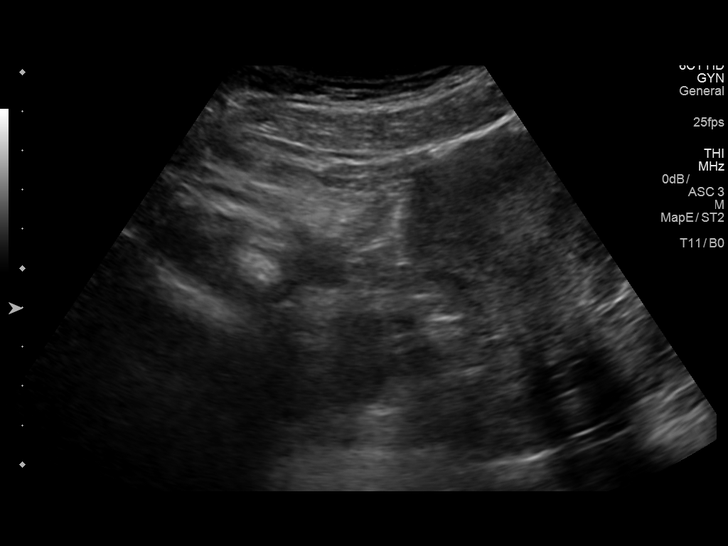
[im 28/82]
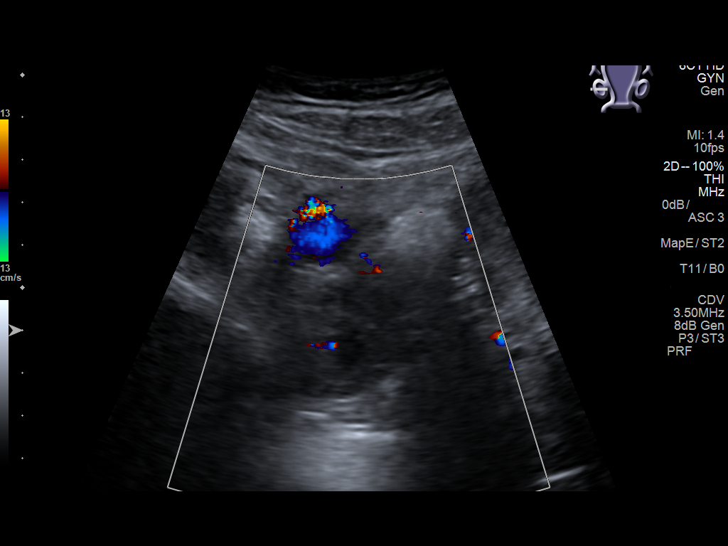
[im 31/82]
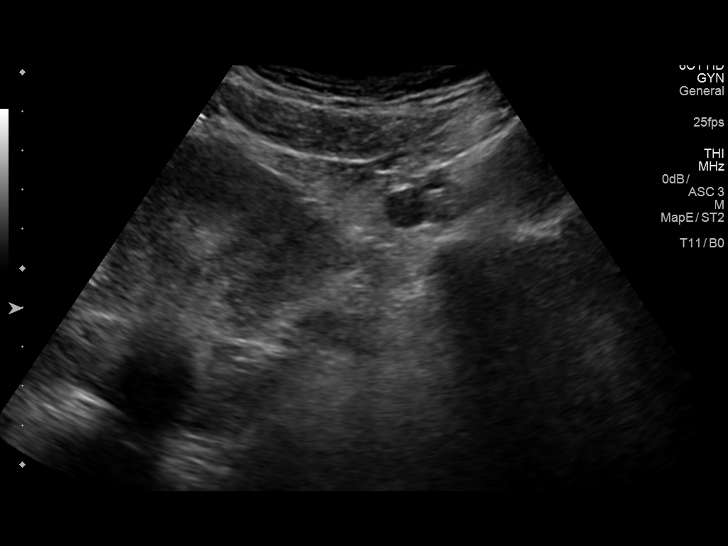
[im 38/82]
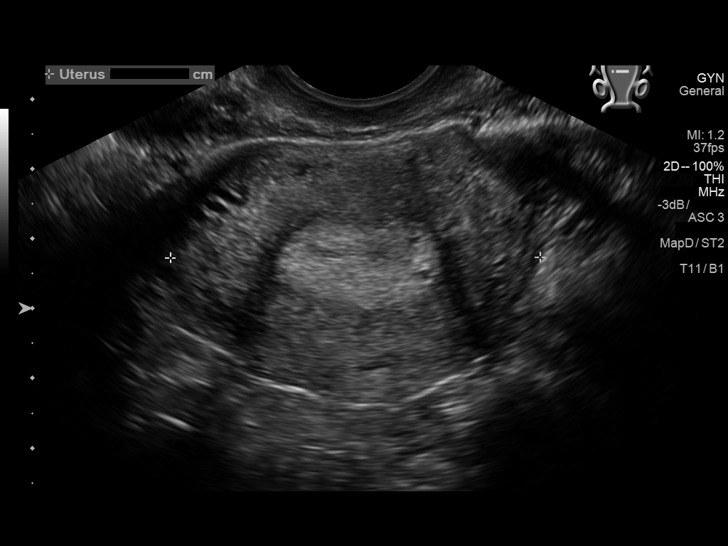
[im 44/82]
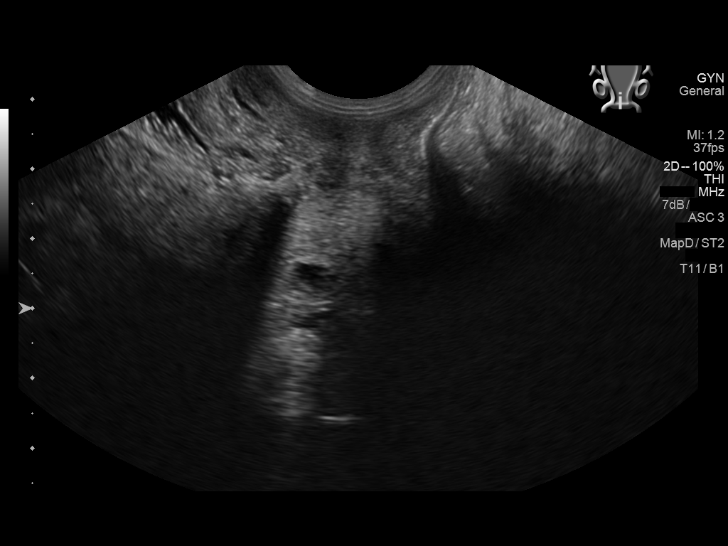
[im 51/82]
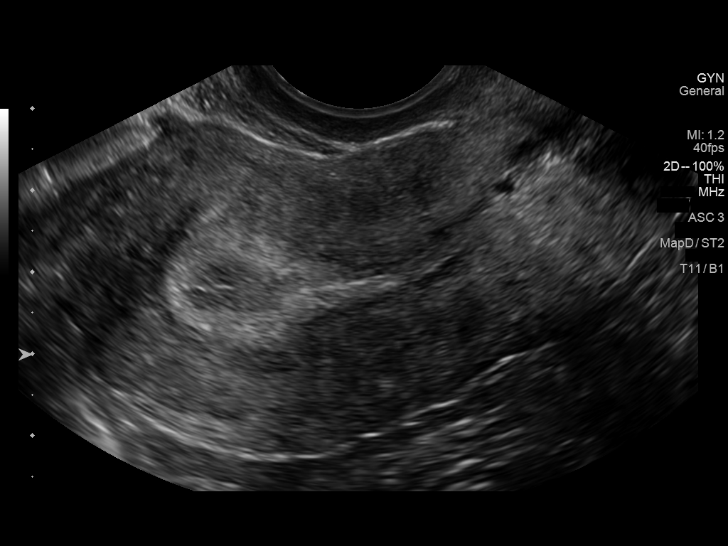
[im 55/82]
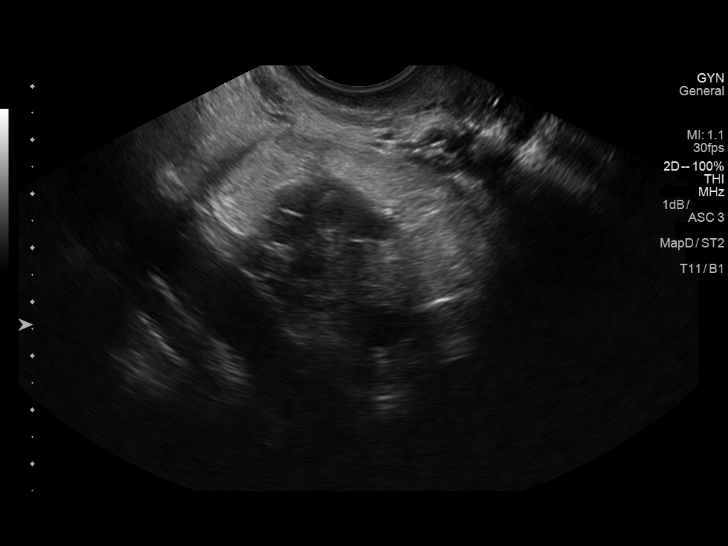
[im 61/82]
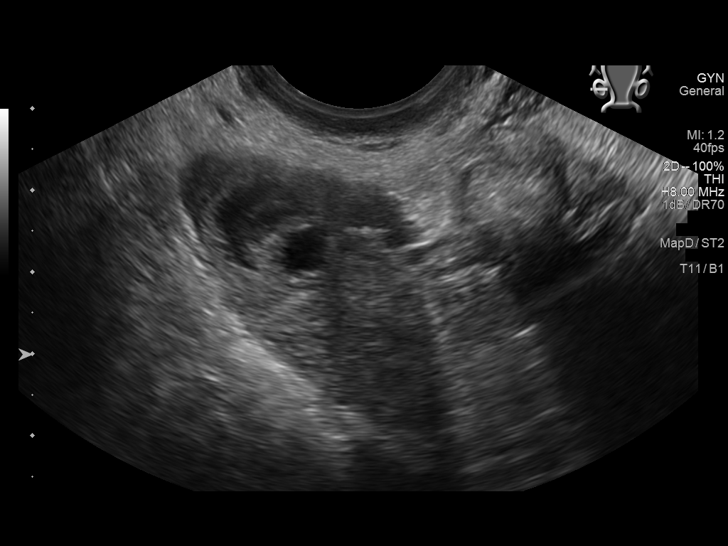
[im 68/82]
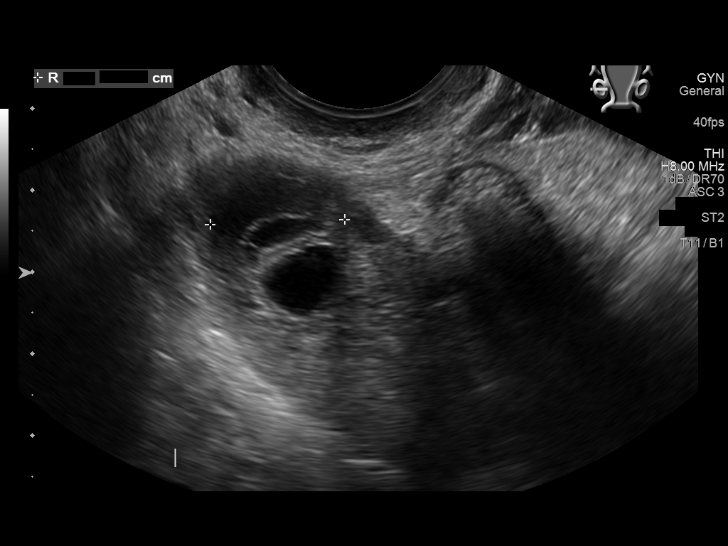
[im 75/82]
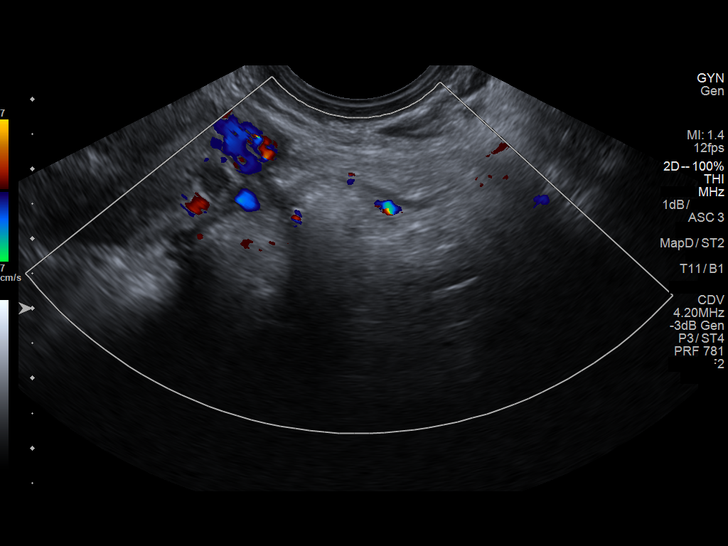
[im 82/82]
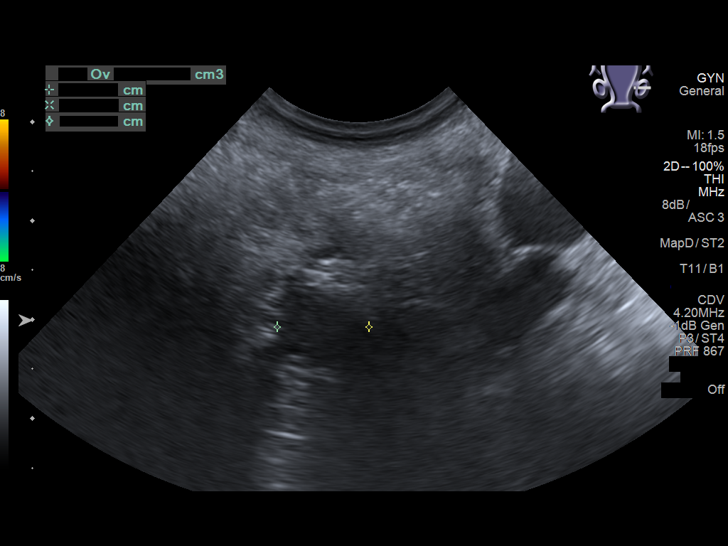

[14 of 25 positions shown; findings below may reference images not displayed]

FINDINGS: Uterus

Measurements: 8.0 x 4.2 x 5.3 cm. No fibroids or other mass
visualized. Incidental nabothian cysts at the cervix.

Endometrium

Thickness: 13 mm.  No focal abnormality visualized.

Right ovary

Measurements: 3.9 x 2.6 x 2.4 cm. Several cysts or follicles with no
internal vascular elements (image 71). Normal appearance/no adnexal
mass.

Left ovary

Measurements: 1.9 x 0.9 x 0.9 cm. Not as well visualized. Normal
appearance/no adnexal mass.

Other findings

No abnormal free fluid.
IMPRESSION: Normal ultrasound appearance of the pelvis. Physiologic right
ovarian cysts/follicles.

## 2019-01-15 ENCOUNTER — Other Ambulatory Visit: Payer: Self-pay

## 2019-01-15 ENCOUNTER — Ambulatory Visit: Payer: Managed Care, Other (non HMO) | Admitting: Gastroenterology

## 2019-01-15 ENCOUNTER — Encounter: Payer: Self-pay | Admitting: Gastroenterology

## 2019-01-15 VITALS — BP 118/87 | HR 94 | Temp 98.4°F | Wt 176.2 lb

## 2019-01-15 DIAGNOSIS — K219 Gastro-esophageal reflux disease without esophagitis: Secondary | ICD-10-CM | POA: Diagnosis not present

## 2019-01-15 MED ORDER — FAMOTIDINE 20 MG PO TABS: 20.0000 mg | ORAL_TABLET | Freq: Every day | ORAL | 0 refills | Status: DC

## 2019-01-15 NOTE — Patient Instructions (Signed)

## 2019-01-15 NOTE — Progress Notes (Signed)
Error

## 2019-01-16 NOTE — Progress Notes (Signed)
Vonda Antigua, MD [address]  [place]  [place], [place] [address]  Main: [phone]  Fax: [phone]   Primary Care Physician: Valerie Roys, DO   Chief Complaint  Patient presents with  . Gastroesophageal Reflux    Patient states symptoms are no changed     HPI: Sheila Benton is a 38 y.o. female here for follow-up of GERD.  Patient on omeprazole 20 mg twice daily and continuing to report symptoms.  Specially when bending, especially in the mornings.  No dysphagia.  Omeprazole is helping but is continuing to have daily symptoms.  No dysphagia.  No weight loss.  Upper endoscopy in July 2019 showed gastric erythema and a hiatal hernia.  Was referred to [facility] due to chronic malrotation and possible nausea vomiting associated with that.  She had a colonoscopy done there and has an upcoming appointment for further evaluation and to discuss surgery.  Current Outpatient Medications  Medication Sig Dispense Refill  . albuterol (PROVENTIL HFA;VENTOLIN HFA) 108 (90 Base) MCG/ACT inhaler INHALE 1 PUFF INTO THE LUNGS EVERY 6 HOURS AS NEEDED FOR WHEEZING OR SHORTNESS OF BREATH 8.5 g 0  . albuterol (PROVENTIL) (2.5 MG/3ML) 0.083% nebulizer solution VVN Q 6 H PRF WHZ OR SOB  3  . omeprazole (PRILOSEC) 20 MG capsule Take 1 capsule (20 mg total) by mouth 2 (two) times daily. 60 capsule 0  . Armodafinil 250 MG tablet Take 1 tablet (250 mg total) by mouth daily. (Patient not taking: Reported on 10/07/2017) 30 tablet 5  . famotidine (PEPCID) 20 MG tablet Take 1 tablet (20 mg total) by mouth at bedtime. 90 tablet 0   No current facility-administered medications for this visit.     Allergies as of 01/15/2019 - Review Complete 01/15/2019  Allergen Reaction Noted  . Armodafinil Other (See Comments) 07/16/2017  . Contrast media [iodinated diagnostic agents] Rash 01/07/2017  . Omnipaque [iohexol] Itching 04/26/2015    ROS:  General: Negative for anorexia, weight loss, fever,  chills, fatigue, weakness. ENT: Negative for hoarseness, difficulty swallowing , nasal congestion. CV: Negative for chest pain, angina, palpitations, dyspnea on exertion, peripheral edema.  Respiratory: Negative for dyspnea at rest, dyspnea on exertion, cough, sputum, wheezing.  GI: See history of present illness. GU:  Negative for dysuria, hematuria, urinary incontinence, urinary frequency, nocturnal urination.  Endo: Negative for unusual weight change.    Physical Examination:   BP 118/87 (BP Location: Left Arm, Patient Position: Sitting, Cuff Size: Normal)   Pulse 94   Temp 98.4 F (36.9 C) (Oral)   Wt 176 lb 4 oz (79.9 kg)   LMP 07/13/2016 (Exact Date)   BMI 28.45 kg/m   General: Well-nourished, well-developed in no acute distress.  Eyes: No icterus. Conjunctivae pink. Mouth: Oropharyngeal mucosa moist and pink , no lesions erythema or exudate. Neck: Supple, Trachea midline Abdomen: Bowel sounds are normal, nontender, nondistended, no hepatosplenomegaly or masses, no abdominal bruits or hernia , no rebound or guarding.   Extremities: No lower extremity edema. No clubbing or deformities. Neuro: Alert and oriented x 3.  Grossly intact. Skin: Warm and dry, no jaundice.   Psych: Alert and cooperative, normal mood and affect.   Labs: CMP     Component Value Date/Time   NA 140 05/06/2017 0934   NA 136 02/06/2013 2146   K 3.6 05/06/2017 0934   K 3.4 (L) 02/06/2013 2146   CL 104 05/06/2017 0934   CL 105 02/06/2013 2146   CO2 23 05/06/2017  0934   CO2 27 02/06/2013 2146   GLUCOSE 72 05/06/2017 0934   GLUCOSE 103 (H) 02/06/2013 2146   BUN 7 05/06/2017 0934   BUN 10 02/06/2013 2146   CREATININE 0.61 05/06/2017 0934   CREATININE 0.84 02/06/2013 2146   CALCIUM 9.1 05/06/2017 0934   CALCIUM 9.4 02/06/2013 2146   PROT 6.4 05/06/2017 0934   PROT 7.5 02/06/2013 2146   ALBUMIN 4.1 05/06/2017 0934   ALBUMIN 3.8 02/06/2013 2146   AST 14 05/06/2017 0934   AST 16 02/06/2013 2146    ALT 8 05/06/2017 0934   ALT 13 02/06/2013 2146   ALKPHOS 70 05/06/2017 0934   ALKPHOS 94 02/06/2013 2146   BILITOT <0.2 05/06/2017 0934   BILITOT 0.6 02/06/2013 2146   GFRNONAA 115 05/06/2017 0934   GFRNONAA >60 02/06/2013 2146   GFRAA 133 05/06/2017 0934   GFRAA >60 02/06/2013 2146   Lab Results  Component Value Date   WBC 7.3 05/06/2017   HGB 12.1 05/06/2017   HCT 38.7 05/06/2017   MCV 91 05/06/2017   PLT 348 05/06/2017    Imaging Studies: No results found.  Assessment and Plan:   Sheila Benton is a 38 y.o. y/o female here for follow-up of GERD  Due to ongoing symptoms, will add Pepcid at bedtime  If symptoms do not get better in 3 to 4 weeks I have asked her to notify us. If symptoms get better, can eventually start decreasing omeprazole  (Risks of PPI use were discussed with patient including bone loss, C. Diff diarrhea, pneumonia, infections, CKD, electrolyte abnormalities.   Pt. Verbalizes understanding and chooses to continue the medication.)  Patient educated extensively on acid reflux lifestyle modification, including buying a bed wedge, not eating 3 hrs before bedtime, diet modifications, and handout given for the same.   Continue follow-up with [facility] for chronic malrotation of gut    Dr Vonda Antigua

## 2019-03-02 ENCOUNTER — Other Ambulatory Visit: Payer: Self-pay

## 2019-03-02 ENCOUNTER — Ambulatory Visit
Admission: EM | Admit: 2019-03-02 | Discharge: 2019-03-02 | Disposition: A | Discharge status: Home / Self Care | Payer: Managed Care, Other (non HMO)

## 2019-03-02 ENCOUNTER — Encounter: Payer: Self-pay | Admitting: Emergency Medicine

## 2019-03-02 DIAGNOSIS — H6503 Acute serous otitis media, bilateral: Secondary | ICD-10-CM

## 2019-03-02 MED ORDER — NEOMYCIN-POLYMYXIN-HC 3.5-10000-1 OT SUSP: 3.0000 [drp] | Freq: Three times a day (TID) | OTIC | 0 refills | Status: AC

## 2019-03-02 MED ORDER — AMOXICILLIN-POT CLAVULANATE 875-125 MG PO TABS: 1.0000 | ORAL_TABLET | Freq: Two times a day (BID) | ORAL | 0 refills | Status: DC

## 2019-03-02 NOTE — Discharge Instructions (Signed)
Take medications as prescribed. Continue your daily allergy medicine. Follow-up with ENT if no improvement in symptoms.   Feel better soon,  Aldona Bar

## 2019-03-02 NOTE — ED Triage Notes (Signed)
Pt c/o bilateral ear pain and bleeding this morning. She states that she noticed blood this morning when she was cleaning her ears. She had felt popping and fullness a b=couple of days prior.

## 2019-03-02 NOTE — ED Provider Notes (Signed)
[facility]    CSN: [id number] Arrival date & time: 03/02/19  0818      History   Chief Complaint Chief Complaint  Patient presents with   Ear Problem    bilateral    HPI Sheila Benton is a 39 y.o. female.   Subjective:   Sheila Benton is a 39 y.o. female who presents for possible ear infection. Symptoms include bilateral ear drainage , bilateral ear pain and congestion. Onset of symptoms was several weeks ago and has been gradually worsening since that time. Associated symptoms include sinus pressure/drainage. She denies any fevers, chills, body aches, sore throat, cough, shortness of breath, nausea, vomiting, headache or dizziness. No change in taste or smell.  She is drinking plenty of fluids. She has been taking her usual allergy medicines. She denies any known exposure to COVID-19.   The following portions of the patient's history were reviewed and updated as appropriate: allergies, current medications, past family history, past medical history, past social history, past surgical history and problem list.       Past Medical History:  Diagnosis Date   ADD (attention deficit disorder)    Anemia    Anxiety    Asthma    WELL CONTROLLED   Headache    MIGRAINES   History of kidney stones    Irregular menses     Patient Active Problem List   Diagnosis Date Noted   Malrotation of intestine 01/01/2018   Impingement syndrome of shoulder region 11/20/2017   Neck pain 11/20/2017   Stomach irritation    Hiatal hernia    Abdominal pain, generalized    Intractable vomiting with nausea    Narcolepsy 04/15/2017   Abnormal weight gain 03/25/2017   Hypersomnia, persistent 01/23/2017   Excessive daytime sleepiness 01/23/2017   Fatigue 12/20/2016   Endometriosis 07/24/2016   Pelvic pain in female 07/24/2016   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   Anxiety disorder 03/10/2015   Asthma 02/10/2015    Past Surgical  History:  Procedure Laterality Date   ABDOMINAL HYSTERECTOMY     CYSTOSCOPY  07/24/2016   Procedure: CYSTOSCOPY;  Surgeon: Gae Dry, MD;  Location: [facility];  Service: Gynecology;;   ESOPHAGOGASTRODUODENOSCOPY (EGD) WITH PROPOFOL N/A 09/20/2017   Procedure: ESOPHAGOGASTRODUODENOSCOPY (EGD) WITH PROPOFOL;  Surgeon: Virgel Manifold, MD;  Location: [facility];  Service: Endoscopy;  Laterality: N/A;   LAPAROSCOPIC BILATERAL SALPINGECTOMY Bilateral 07/24/2016   Procedure: LAPAROSCOPIC BILATERAL SALPINGECTOMY;  Surgeon: Gae Dry, MD;  Location: [facility];  Service: Gynecology;  Laterality: Bilateral;   LAPAROSCOPIC HYSTERECTOMY N/A 07/24/2016   Procedure: HYSTERECTOMY TOTAL LAPAROSCOPIC;  Surgeon: Gae Dry, MD;  Location: [facility];  Service: Gynecology;  Laterality: N/A;   TUBAL LIGATION  12/24/2003    OB History    Gravida  3   Para  3   Term  3   Preterm      AB      Living  3     SAB      TAB      Ectopic      Multiple      Live Births  3            Home Medications    Prior to Admission medications   Medication Sig Start Date End Date Taking? Authorizing Provider  albuterol (PROVENTIL HFA;VENTOLIN HFA) 108 (90 Base) MCG/ACT inhaler INHALE 1 PUFF INTO THE LUNGS EVERY 6 HOURS AS NEEDED FOR WHEEZING OR SHORTNESS OF  BREATH 08/27/17  Yes Volney American, PA-C  BREO ELLIPTA 200-25 MCG/INH AEPB Inhale 1 puff into the lungs daily. 02/14/19  Yes [provider]  famotidine (PEPCID) 20 MG tablet Take 1 tablet (20 mg total) by mouth at bedtime. 01/15/19  Yes Virgel Manifold, MD  omeprazole (PRILOSEC) 20 MG capsule Take 1 capsule (20 mg total) by mouth 2 (two) times daily. 10/15/18 03/02/19 Yes Vonda Antigua B, MD  albuterol (PROVENTIL) (2.5 MG/3ML) 0.083% nebulizer solution VVN Q 6 H PRF WHZ OR SOB 02/05/17   [provider]  amoxicillin-clavulanate (AUGMENTIN) 875-125 MG tablet Take 1 tablet by mouth every 12  (twelve) hours. 03/02/19   Enrique Sack, FNP  Armodafinil 250 MG tablet Take 1 tablet (250 mg total) by mouth daily. Patient not taking: Reported on 10/07/2017 04/18/17   Dohmeier, Asencion Partridge, MD  neomycin-polymyxin-hydrocortisone (CORTISPORIN) 3.5-10000-1 OTIC suspension Place 3 drops into both ears 3 (three) times daily for 7 days. 03/02/19 03/09/19  Enrique Sack, FNP    Family History Family History  Problem Relation Age of Onset   Hypertension Mother    Endometriosis Sister    Heart disease Maternal Grandmother    Hypertension Maternal Grandmother    Stroke Maternal Grandmother    Lung cancer Maternal Grandfather     Social History Social History   Tobacco Use   Smoking status: Former Smoker   Smokeless tobacco: Never Used  Substance Use Topics   Alcohol use: No   Drug use: No     Allergies   Armodafinil, Contrast media [iodinated diagnostic agents], and Omnipaque [iohexol]   Review of Systems Review of Systems  Constitutional: Negative for chills, fatigue and fever.  HENT: Positive for congestion, ear discharge, ear pain, postnasal drip and sinus pressure. Negative for rhinorrhea, sore throat and tinnitus.   Respiratory: Negative.   Gastrointestinal: Negative.   Musculoskeletal: Negative.   Neurological: Negative.   All other systems reviewed and are negative.    Physical Exam Triage Vital Signs ED Triage Vitals  Enc Vitals Group     BP 03/02/19 0839 (!) 131/102     Pulse Rate 03/02/19 0839 99     Resp 03/02/19 0839 18     Temp 03/02/19 0839 98 F (36.7 C)     Temp Source 03/02/19 0839 Oral     SpO2 03/02/19 0839 97 %     Weight 03/02/19 0836 175 lb (79.4 kg)     Height 03/02/19 0836 5\' 6"  (1.676 m)     Head Circumference --      Peak Flow --      Pain Score 03/02/19 0835 2     Pain Loc --      Pain Edu? --      Excl. in [place]? --    No data found.  Updated Vital Signs BP (!) 131/102 (BP Location: Right Arm)    Pulse 99    Temp 98 F  (36.7 C) (Oral)    Resp 18    Ht 5\' 6"  (1.676 m)    Wt 175 lb (79.4 kg)    LMP 07/13/2016 (Exact Date)    SpO2 97%    BMI 28.25 kg/m   Visual Acuity Right Eye Distance:   Left Eye Distance:   Bilateral Distance:    Right Eye Near:   Left Eye Near:    Bilateral Near:     Physical Exam Vitals reviewed.  Constitutional:      Appearance: Normal appearance. She is not ill-appearing  or toxic-appearing.  HENT:     Head: Normocephalic.     Right Ear: Hearing and external ear normal. Tenderness present. No drainage or swelling. A middle ear effusion is present. Tympanic membrane is not erythematous.     Left Ear: Hearing and external ear normal. Tenderness present. No drainage or swelling. A middle ear effusion is present. Tympanic membrane is not erythematous.     Nose: Nose normal.  Eyes:     Extraocular Movements: Extraocular movements intact.     Pupils: Pupils are equal, round, and reactive to light.  Cardiovascular:     Rate and Rhythm: Normal rate and regular rhythm.  Pulmonary:     Effort: Pulmonary effort is normal.     Breath sounds: Normal breath sounds.  Musculoskeletal:        General: Normal range of motion.     Cervical back: Normal range of motion and neck supple.  Skin:    General: Skin is warm and dry.  Neurological:     General: No focal deficit present.     Mental Status: She is alert and oriented to person, place, and time.  Psychiatric:        Mood and Affect: Mood normal.        Behavior: Behavior normal.      UC Treatments / Results  Labs (all labs ordered are listed, but only abnormal results are displayed) Labs Reviewed - No data to display  EKG   Radiology No results found.  Procedures Procedures (including critical care time)  Medications Ordered in UC Medications - No data to display  Initial Impression / Assessment and Plan / UC Course  I have reviewed the triage vital signs and the nursing notes.  Pertinent labs & imaging results  that were available during my care of the patient were reviewed by me and considered in my medical decision making (see chart for details).    39 yo female with a history of seasonal allergies presents with worsening ear pain and pressure. She also has her usual allergy symptoms without any fevers, chills or body aches. She has no known COVID exposure. Cortisporin otic drops and Augmentin treatment. Continue daily allergy medications. OTC analgesics, fluids and rest. Follow-up with ENT if not improving.   Today's evaluation has revealed no signs of a dangerous process. Discussed diagnosis with patient and/or guardian. Patient and/or guardian aware of their diagnosis, possible red flag symptoms to watch out for and need for close follow up. Patient and/or guardian understands verbal and written discharge instructions. Patient and/or guardian comfortable with plan and disposition.  Patient and/or guardian has a clear mental status at this time, good insight into illness (after discussion and teaching) and has clear judgment to make decisions regarding their care  This care was provided during an unprecedented National Emergency due to the Novel Coronavirus (COVID-19) pandemic. COVID-19 infections and transmission risks place heavy strains on healthcare resources.  As this pandemic evolves, our facility, providers, and staff strive to respond fluidly, to remain operational, and to provide care relative to available resources and information. Outcomes are unpredictable and treatments are without well-defined guidelines. Further, the impact of COVID-19 on all aspects of urgent care, including the impact to patients seeking care for reasons other than COVID-19, is unavoidable during this national emergency. At this time of the global pandemic, management of patients has significantly changed, even for non-COVID positive patients given high local and regional COVID volumes at this time requiring high healthcare  system  and resource utilization. The standard of care for management of both COVID suspected and non-COVID suspected patients continues to change rapidly at the local, regional, national, and global levels. This patient was worked up and treated to the best available but ever changing evidence and resources available at this current time.   Documentation was completed with the aid of voice recognition software. Transcription may contain typographical errors.  Final Clinical Impressions(s) / UC Diagnoses   Final diagnoses:  Bilateral acute serous otitis media, recurrence not specified     Discharge Instructions     Take medications as prescribed. Continue your daily allergy medicine. Follow-up with ENT if no improvement in symptoms.   Feel better soon,  [facility]     ED Prescriptions    Medication Sig Dispense Auth. Provider   amoxicillin-clavulanate (AUGMENTIN) 875-125 MG tablet Take 1 tablet by mouth every 12 (twelve) hours. 14 tablet Salaam Battershell, [place], FNP   neomycin-polymyxin-hydrocortisone (CORTISPORIN) 3.5-10000-1 OTIC suspension Place 3 drops into both ears 3 (three) times daily for 7 days. 3.2 mL Enrique Sack, FNP     PDMP not reviewed this encounter.   Orlin Hilding [place], [place] 03/02/19 [phone]

## 2019-03-24 ENCOUNTER — Ambulatory Visit: Payer: Managed Care, Other (non HMO) | Attending: Internal Medicine

## 2019-03-24 DIAGNOSIS — Z20822 Contact with and (suspected) exposure to covid-19: Secondary | ICD-10-CM

## 2019-03-25 LAB — NOVEL CORONAVIRUS, NAA: SARS-CoV-2, NAA: NOT DETECTED

## 2019-04-30 ENCOUNTER — Other Ambulatory Visit: Payer: Self-pay

## 2019-04-30 ENCOUNTER — Ambulatory Visit (INDEPENDENT_AMBULATORY_CARE_PROVIDER_SITE_OTHER): Payer: Managed Care, Other (non HMO) | Admitting: Family Medicine

## 2019-04-30 ENCOUNTER — Encounter: Payer: Self-pay | Admitting: Family Medicine

## 2019-04-30 VITALS — BP 138/82 | HR 84 | Temp 98.3°F

## 2019-04-30 DIAGNOSIS — Z114 Encounter for screening for human immunodeficiency virus [HIV]: Secondary | ICD-10-CM

## 2019-04-30 DIAGNOSIS — J4540 Moderate persistent asthma, uncomplicated: Secondary | ICD-10-CM | POA: Diagnosis not present

## 2019-04-30 DIAGNOSIS — Z13220 Encounter for screening for lipoid disorders: Secondary | ICD-10-CM

## 2019-04-30 DIAGNOSIS — R635 Abnormal weight gain: Secondary | ICD-10-CM | POA: Diagnosis not present

## 2019-04-30 DIAGNOSIS — R631 Polydipsia: Secondary | ICD-10-CM | POA: Diagnosis not present

## 2019-04-30 DIAGNOSIS — R5382 Chronic fatigue, unspecified: Secondary | ICD-10-CM | POA: Diagnosis not present

## 2019-04-30 DIAGNOSIS — F411 Generalized anxiety disorder: Secondary | ICD-10-CM

## 2019-04-30 LAB — UA/M W/RFLX CULTURE, ROUTINE
Bilirubin, UA: NEGATIVE
Glucose, UA: NEGATIVE
Ketones, UA: NEGATIVE
Leukocytes,UA: NEGATIVE
Nitrite, UA: NEGATIVE
Protein,UA: NEGATIVE
RBC, UA: NEGATIVE
Specific Gravity, UA: 1.01 (ref 1.005–1.030)
Urobilinogen, Ur: 0.2 mg/dL (ref 0.2–1.0)
pH, UA: 5.5 (ref 5.0–7.5)

## 2019-04-30 LAB — BAYER DCA HB A1C WAIVED: HB A1C (BAYER DCA - WAIVED): 5.6 % (ref <7.0)

## 2019-04-30 MED ORDER — FLUTICASONE-SALMETEROL 250-50 MCG/DOSE IN AEPB: 1.0000 | INHALATION_SPRAY | Freq: Two times a day (BID) | RESPIRATORY_TRACT | 3 refills | Status: DC

## 2019-04-30 NOTE — Patient Instructions (Signed)
We are recommending the vaccine to everyone who has not had an allergic reaction to any of the components of the vaccine. If you have specific questions about the vaccine, please bring them up with your health care provider to discuss them.   We will likely not be getting the vaccine in the office for the first rounds of vaccinations. The way they are releasing the vaccines is going to be through the health systems (like [facility], [facility], [facility], [facility]), through your county health department, or through the pharmacies.   The [facility] is giving vaccines to those 65+ and Health Care Workers Teachers and Child Care providers start 04/22/19 Call [phone] to schedule  If you are 65+ you can get a vaccine through [place] by signing up for an appointment.  You can sign up by going to: [email or web address].  You can get more information by going to: [email or web address].gov/[email or web address]  [facility] next door is giving the Moderna Vaccine- you can call [phone] or stop by there to schedule.  

## 2019-04-30 NOTE — Progress Notes (Signed)
BP 138/82   Pulse 84   Temp 98.3 F (36.8 C)   LMP 07/13/2016 (Exact Date)   SpO2 98%    Subjective:    Patient ID: Sheila Benton, female    DOB: Aug 30, 1980, 39 y.o.   MRN: [id number]  HPI: Sheila Benton is a 39 y.o. female  Chief Complaint  Patient presents with  . Asthma    Patient states that the [facility] is over 600 a month, she needs something different   ASTHMA- [facility] is now $600. Cannot afford that. Needs to chang to something else. Hasn't been on the breo for about a month. Breathing has not been doing well. Using nebulizer every day and using her inhaler every night. Asthma status: uncontrolled Satisfied with current treatment?: no Albuterol/rescue inhaler frequency: a few times a day Dyspnea frequency: a few times a day Wheezing frequency:a few times a day Cough frequency: a few times a day Nocturnal symptom frequency: nightly Limitation of activity: yes Current upper respiratory symptoms: no Triggers: allergies Aerochamber/spacer use: no Visits to ER or Urgent Care in past year: no Pneumovax: Not up to Date Influenza: Up to Date  Relevant past medical, surgical, family and social history reviewed and updated as indicated. Interim medical history since our last visit reviewed. Allergies and medications reviewed and updated.  Review of Systems  Constitutional: Positive for chills, fatigue and unexpected weight change. Negative for activity change, appetite change, diaphoresis and fever.  HENT: Negative.   Respiratory: Positive for cough, chest tightness, shortness of breath and wheezing. Negative for apnea, choking and stridor.   Cardiovascular: Positive for leg swelling. Negative for chest pain and palpitations.  Gastrointestinal: Positive for constipation and diarrhea. Negative for abdominal distention, abdominal pain, anal bleeding, blood in stool, nausea, rectal pain and vomiting.  Endocrine: Positive for cold intolerance and polydipsia. Negative for heat  intolerance, polyphagia and polyuria.  Genitourinary: Negative.   Musculoskeletal: Negative.   Skin: Negative.   Neurological: Negative.   Psychiatric/Behavioral: Negative.     Per HPI unless specifically indicated above     Objective:    BP 138/82   Pulse 84   Temp 98.3 F (36.8 C)   LMP 07/13/2016 (Exact Date)   SpO2 98%   Wt Readings from Last 3 Encounters:  03/02/19 175 lb (79.4 kg)  01/15/19 176 lb 4 oz (79.9 kg)  10/15/18 169 lb 12.8 oz (77 kg)    Physical Exam Vitals and nursing note reviewed.  Constitutional:      General: She is not in acute distress.    Appearance: Normal appearance. She is not ill-appearing, toxic-appearing or diaphoretic.  HENT:     Head: Normocephalic and atraumatic.     Right Ear: External ear normal.     Left Ear: External ear normal.     Nose: Nose normal.     Mouth/Throat:     Mouth: Mucous membranes are moist.     Pharynx: Oropharynx is clear.  Eyes:     General: No scleral icterus.       Right eye: No discharge.        Left eye: No discharge.     Extraocular Movements: Extraocular movements intact.     Conjunctiva/sclera: Conjunctivae normal.     Pupils: Pupils are equal, round, and reactive to light.  Cardiovascular:     Rate and Rhythm: Normal rate and regular rhythm.     Pulses: Normal pulses.     Heart sounds: Normal heart sounds. No murmur.  No friction rub. No gallop.   Pulmonary:     Effort: Pulmonary effort is normal. No respiratory distress.     Breath sounds: No stridor. Wheezing (scattered) present. No rhonchi or rales.  Chest:     Chest wall: No tenderness.  Musculoskeletal:        General: Normal range of motion.     Cervical back: Normal range of motion and neck supple.  Skin:    General: Skin is warm and dry.     Capillary Refill: Capillary refill takes less than 2 seconds.     Coloration: Skin is not jaundiced or pale.     Findings: No bruising, erythema, lesion or rash.  Neurological:     General: No  focal deficit present.     Mental Status: She is alert and oriented to person, place, and time. Mental status is at baseline.  Psychiatric:        Mood and Affect: Mood normal.        Behavior: Behavior normal.        Thought Content: Thought content normal.        Judgment: Judgment normal.     Results for orders placed or performed in visit on 04/30/19  CBC with Differential/Platelet  Result Value Ref Range   WBC 8.2 3.4 - 10.8 x10E3/uL   RBC 4.62 3.77 - 5.28 x10E6/uL   Hemoglobin 12.9 11.1 - 15.9 g/dL   Hematocrit 40.8 34.0 - 46.6 %   MCV 88 79 - 97 fL   MCH 27.9 26.6 - 33.0 pg   MCHC 31.6 31.5 - 35.7 g/dL   RDW 13.0 11.7 - 15.4 %   Platelets 368 150 - 450 x10E3/uL   Neutrophils 61 Not Estab. %   Lymphs 25 Not Estab. %   Monocytes 8 Not Estab. %   Eos 4 Not Estab. %   Basos 1 Not Estab. %   Neutrophils Absolute 5.1 1.4 - 7.0 x10E3/uL   Lymphocytes Absolute 2.0 0.7 - 3.1 x10E3/uL   Monocytes Absolute 0.7 0.1 - 0.9 x10E3/uL   EOS (ABSOLUTE) 0.3 0.0 - 0.4 x10E3/uL   Basophils Absolute 0.1 0.0 - 0.2 x10E3/uL   Immature Granulocytes 1 Not Estab. %   Immature Grans (Abs) 0.1 0.0 - 0.1 x10E3/uL  Comprehensive metabolic panel  Result Value Ref Range   Glucose 81 65 - 99 mg/dL   BUN 7 6 - 24 mg/dL   Creatinine, Ser 0.81 0.57 - 1.00 mg/dL   GFR calc non Af Amer 91 >59 mL/min/1.73   GFR calc Af Amer 105 >59 mL/min/1.73   BUN/Creatinine Ratio 9 9 - 23   Sodium 138 134 - 144 mmol/L   Potassium 4.2 3.5 - 5.2 mmol/L   Chloride 105 96 - 106 mmol/L   CO2 20 20 - 29 mmol/L   Calcium 9.1 8.7 - 10.2 mg/dL   Total Protein 6.3 6.0 - 8.5 g/dL   Albumin 3.9 3.8 - 4.8 g/dL   Globulin, Total 2.4 1.5 - 4.5 g/dL   Albumin/Globulin Ratio 1.6 1.2 - 2.2   Bilirubin Total 0.3 0.0 - 1.2 mg/dL   Alkaline Phosphatase 89 39 - 117 IU/L   AST 20 0 - 40 IU/L   ALT 35 (H) 0 - 32 IU/L  Lipid Panel w/o Chol/HDL Ratio  Result Value Ref Range   Cholesterol, Total 188 100 - 199 mg/dL   Triglycerides  153 (H) 0 - 149 mg/dL   HDL 33 (L) >39 mg/dL   VLDL  Cholesterol Cal 28 5 - 40 mg/dL   LDL Chol Calc (NIH) 127 (H) 0 - 99 mg/dL  TSH  Result Value Ref Range   TSH 1.400 0.450 - 4.500 uIU/mL  UA/M w/rflx Culture, Routine   Specimen: Urine   URINE  Result Value Ref Range   Specific Gravity, UA 1.010 1.005 - 1.030   pH, UA 5.5 5.0 - 7.5   Color, UA Yellow Yellow   Appearance Ur Clear Clear   Leukocytes,UA Negative Negative   Protein,UA Negative Negative/Trace   Glucose, UA Negative Negative   Ketones, UA Negative Negative   RBC, UA Negative Negative   Bilirubin, UA Negative Negative   Urobilinogen, Ur 0.2 0.2 - 1.0 mg/dL   Nitrite, UA Negative Negative  VITAMIN D 25 Hydroxy (Vit-D Deficiency, Fractures)  Result Value Ref Range   Vit D, 25-Hydroxy 10.5 (L) 30.0 - 100.0 ng/mL  HIV Antibody (routine testing w rflx)  Result Value Ref Range   HIV Screen 4th Generation wRfx Non Reactive Non Reactive  Bayer DCA Hb A1c Waived  Result Value Ref Range   HB A1C (BAYER DCA - WAIVED) 5.6 <7.0 %      Assessment & Plan:   Problem List Items Addressed This Visit      Respiratory   Asthma - Primary    Breo got too expensive. Will start advair and recheck 1 month. Call with any concerns. Continue to monitor.       Relevant Medications   montelukast (SINGULAIR) 10 MG tablet   Fluticasone-Salmeterol (WIXELA INHUB) 250-50 MCG/DOSE AEPB   Other Relevant Orders   CBC with Differential/Platelet (Completed)   Comprehensive metabolic panel (Completed)   TSH (Completed)     Other   Anxiety disorder    Will check labs. Await results. Treat as needed.      Fatigue    Will check labs. Await results. Treat as needed.      Relevant Orders   CBC with Differential/Platelet (Completed)   Comprehensive metabolic panel (Completed)   TSH (Completed)   VITAMIN D 25 Hydroxy (Vit-D Deficiency, Fractures) (Completed)    Other Visit Diagnoses    Weight gain       Will check labs. Await results.  Treat as needed.    Relevant Orders   CBC with Differential/Platelet (Completed)   Comprehensive metabolic panel (Completed)   TSH (Completed)   Bayer DCA Hb A1c Waived (Completed)   Polydipsia       Will check labs. Await results. Treat as needed.    Relevant Orders   CBC with Differential/Platelet (Completed)   Comprehensive metabolic panel (Completed)   TSH (Completed)   UA/M w/rflx Culture, Routine (Completed)   Screening for cholesterol level       Will check labs. Await results. Treat as needed.    Relevant Orders   Lipid Panel w/o Chol/HDL Ratio (Completed)   Screening for HIV without presence of risk factors       Will check labs. Await results. Treat as needed.    Relevant Orders   HIV Antibody (routine testing w rflx) (Completed)       Follow up plan: Return in about 4 weeks (around 05/28/2019) for physical.

## 2019-05-01 LAB — COMPREHENSIVE METABOLIC PANEL
ALT: 35 IU/L — ABNORMAL HIGH (ref 0–32)
AST: 20 IU/L (ref 0–40)
Albumin/Globulin Ratio: 1.6 (ref 1.2–2.2)
Albumin: 3.9 g/dL (ref 3.8–4.8)
Alkaline Phosphatase: 89 IU/L (ref 39–117)
BUN/Creatinine Ratio: 9 (ref 9–23)
BUN: 7 mg/dL (ref 6–24)
Bilirubin Total: 0.3 mg/dL (ref 0.0–1.2)
CO2: 20 mmol/L (ref 20–29)
Calcium: 9.1 mg/dL (ref 8.7–10.2)
Chloride: 105 mmol/L (ref 96–106)
Creatinine, Ser: 0.81 mg/dL (ref 0.57–1.00)
GFR calc Af Amer: 105 mL/min/{1.73_m2} (ref >59)
GFR calc non Af Amer: 91 mL/min/{1.73_m2} (ref >59)
Globulin, Total: 2.4 g/dL (ref 1.5–4.5)
Glucose: 81 mg/dL (ref 65–99)
Potassium: 4.2 mmol/L (ref 3.5–5.2)
Sodium: 138 mmol/L (ref 134–144)
Total Protein: 6.3 g/dL (ref 6.0–8.5)

## 2019-05-01 LAB — TSH: TSH: 1.4 u[IU]/mL (ref 0.450–4.500)

## 2019-05-01 LAB — CBC WITH DIFFERENTIAL/PLATELET
Basophils Absolute: 0.1 10*3/uL (ref 0.0–0.2)
Basos: 1 %
EOS (ABSOLUTE): 0.3 10*3/uL (ref 0.0–0.4)
Eos: 4 %
Hematocrit: 40.8 % (ref 34.0–46.6)
Hemoglobin: 12.9 g/dL (ref 11.1–15.9)
Immature Grans (Abs): 0.1 10*3/uL (ref 0.0–0.1)
Immature Granulocytes: 1 %
Lymphocytes Absolute: 2 10*3/uL (ref 0.7–3.1)
Lymphs: 25 %
MCH: 27.9 pg (ref 26.6–33.0)
MCHC: 31.6 g/dL (ref 31.5–35.7)
MCV: 88 fL (ref 79–97)
Monocytes Absolute: 0.7 10*3/uL (ref 0.1–0.9)
Monocytes: 8 %
Neutrophils Absolute: 5.1 10*3/uL (ref 1.4–7.0)
Neutrophils: 61 %
Platelets: 368 10*3/uL (ref 150–450)
RBC: 4.62 x10E6/uL (ref 3.77–5.28)
RDW: 13 % (ref 11.7–15.4)
WBC: 8.2 10*3/uL (ref 3.4–10.8)

## 2019-05-01 LAB — LIPID PANEL W/O CHOL/HDL RATIO
Cholesterol, Total: 188 mg/dL (ref 100–199)
HDL: 33 mg/dL — ABNORMAL LOW (ref >39)
LDL Chol Calc (NIH): 127 mg/dL — ABNORMAL HIGH (ref 0–99)
Triglycerides: 153 mg/dL — ABNORMAL HIGH (ref 0–149)
VLDL Cholesterol Cal: 28 mg/dL (ref 5–40)

## 2019-05-01 LAB — HIV ANTIBODY (ROUTINE TESTING W REFLEX): HIV Screen 4th Generation wRfx: NONREACTIVE

## 2019-05-01 LAB — VITAMIN D 25 HYDROXY (VIT D DEFICIENCY, FRACTURES): Vit D, 25-Hydroxy: 10.5 ng/mL — ABNORMAL LOW (ref 30.0–100.0)

## 2019-05-02 ENCOUNTER — Encounter: Payer: Self-pay | Admitting: Family Medicine

## 2019-05-02 NOTE — Assessment & Plan Note (Signed)
Will check labs. Await results. Treat as needed.  

## 2019-05-02 NOTE — Assessment & Plan Note (Signed)
Breo got too expensive. Will start advair and recheck 1 month. Call with any concerns. Continue to monitor.

## 2019-05-03 ENCOUNTER — Encounter: Payer: Self-pay | Admitting: Family Medicine

## 2019-05-03 ENCOUNTER — Other Ambulatory Visit: Payer: Self-pay | Admitting: Family Medicine

## 2019-05-03 MED ORDER — VITAMIN D (ERGOCALCIFEROL) 1.25 MG (50000 UNIT) PO CAPS: 50000.0000 [IU] | ORAL_CAPSULE | ORAL | 0 refills | Status: DC

## 2019-05-28 ENCOUNTER — Other Ambulatory Visit: Payer: Self-pay

## 2019-05-28 ENCOUNTER — Ambulatory Visit (INDEPENDENT_AMBULATORY_CARE_PROVIDER_SITE_OTHER): Payer: Managed Care, Other (non HMO) | Admitting: Family Medicine

## 2019-05-28 ENCOUNTER — Encounter: Payer: Self-pay | Admitting: Family Medicine

## 2019-05-28 VITALS — BP 114/73 | HR 88 | Temp 98.5°F | Ht 65.75 in | Wt 176.6 lb

## 2019-05-28 DIAGNOSIS — Z1231 Encounter for screening mammogram for malignant neoplasm of breast: Secondary | ICD-10-CM | POA: Diagnosis not present

## 2019-05-28 DIAGNOSIS — J4540 Moderate persistent asthma, uncomplicated: Secondary | ICD-10-CM | POA: Diagnosis not present

## 2019-05-28 DIAGNOSIS — Z0000 Encounter for general adult medical examination without abnormal findings: Secondary | ICD-10-CM

## 2019-05-28 MED ORDER — DICLOFENAC SODIUM 1 % EX GEL: 4.0000 g | Freq: Four times a day (QID) | CUTANEOUS | 3 refills | Status: DC

## 2019-05-28 NOTE — Assessment & Plan Note (Signed)
Under good control on current regimen. Continue current regimen. Continue to monitor. Call with any concerns. Refills given. Labs drawn last visit and normal.   

## 2019-05-28 NOTE — Patient Instructions (Addendum)
Health Maintenance, Female Adopting a healthy lifestyle and getting preventive care are important in promoting health and wellness. Ask your health care provider about:  The right schedule for you to have regular tests and exams.  Things you can do on your own to prevent diseases and keep yourself healthy. What should I know about diet, weight, and exercise? Eat a healthy diet   Eat a diet that includes plenty of vegetables, fruits, low-fat dairy products, and lean protein.  Do not eat a lot of foods that are high in solid fats, added sugars, or sodium. Maintain a healthy weight Body mass index (BMI) is used to identify weight problems. It estimates body fat based on height and weight. Your health care provider can help determine your BMI and help you achieve or maintain a healthy weight. Get regular exercise Get regular exercise. This is one of the most important things you can do for your health. Most adults should:  Exercise for at least 150 minutes each week. The exercise should increase your heart rate and make you sweat (moderate-intensity exercise).  Do strengthening exercises at least twice a week. This is in addition to the moderate-intensity exercise.  Spend less time sitting. Even light physical activity can be beneficial. Watch cholesterol and blood lipids Have your blood tested for lipids and cholesterol at 40 years of age, then have this test every 5 years. Have your cholesterol levels checked more often if:  Your lipid or cholesterol levels are high.  You are older than 40 years of age.  You are at high risk for heart disease. What should I know about cancer screening? Depending on your health history and family history, you may need to have cancer screening at various ages. This may include screening for:  Breast cancer.  Cervical cancer.  Colorectal cancer.  Skin cancer.  Lung cancer. What should I know about heart disease, diabetes, and high blood  pressure? Blood pressure and heart disease  High blood pressure causes heart disease and increases the risk of stroke. This is more likely to develop in people who have high blood pressure readings, are of African descent, or are overweight.  Have your blood pressure checked: ? Every 3-5 years if you are 18-39 years of age. ? Every year if you are 40 years old or older. Diabetes Have regular diabetes screenings. This checks your fasting blood sugar level. Have the screening done:  Once every three years after age 40 if you are at a normal weight and have a low risk for diabetes.  More often and at a younger age if you are overweight or have a high risk for diabetes. What should I know about preventing infection? Hepatitis B If you have a higher risk for hepatitis B, you should be screened for this virus. Talk with your health care provider to find out if you are at risk for hepatitis B infection. Hepatitis C Testing is recommended for:  Everyone born from 1945 through 1965.  Anyone with known risk factors for hepatitis C. Sexually transmitted infections (STIs)  Get screened for STIs, including gonorrhea and chlamydia, if: ? You are sexually active and are younger than 40 years of age. ? You are older than 40 years of age and your health care provider tells you that you are at risk for this type of infection. ? Your sexual activity has changed since you were last screened, and you are at increased risk for chlamydia or gonorrhea. Ask your health care provider if   you are at risk.  Ask your health care provider about whether you are at high risk for HIV. Your health care provider may recommend a prescription medicine to help prevent HIV infection. If you choose to take medicine to prevent HIV, you should first get tested for HIV. You should then be tested every 3 months for as long as you are taking the medicine. Pregnancy  If you are about to stop having your period (premenopausal) and  you may become pregnant, seek counseling before you get pregnant.  Take 400 to 800 micrograms (mcg) of folic acid every day if you become pregnant.  Ask for birth control (contraception) if you want to prevent pregnancy. Osteoporosis and menopause Osteoporosis is a disease in which the bones lose minerals and strength with aging. This can result in bone fractures. If you are 54 years old or older, or if you are at risk for osteoporosis and fractures, ask your health care provider if you should:  Be screened for bone loss.  Take a calcium or vitamin D supplement to lower your risk of fractures.  Be given hormone replacement therapy (HRT) to treat symptoms of menopause. Follow these instructions at home: Lifestyle  Do not use any products that contain nicotine or tobacco, such as cigarettes, e-cigarettes, and chewing tobacco. If you need help quitting, ask your health care provider.  Do not use street drugs.  Do not share needles.  Ask your health care provider for help if you need support or information about quitting drugs. Alcohol use  Do not drink alcohol if: ? Your health care provider tells you not to drink. ? You are pregnant, may be pregnant, or are planning to become pregnant.  If you drink alcohol: ? Limit how much you use to 0-1 drink a day. ? Limit intake if you are breastfeeding.  Be aware of how much alcohol is in your drink. In the [place] one drink equals one 12 oz bottle of beer (355 mL), one 5 oz glass of wine (148 mL), or one 1 oz glass of hard liquor (44 mL). General instructions  Schedule regular health, dental, and eye exams.  Stay current with your vaccines.  Tell your health care provider if: ? You often feel depressed. ? You have ever been abused or do not feel safe at home. Summary  Adopting a healthy lifestyle and getting preventive care are important in promoting health and wellness.  Follow your health care provider's instructions about healthy  diet, exercising, and getting tested or screened for diseases.  Follow your health care provider's instructions on monitoring your cholesterol and blood pressure. This information is not intended to replace advice given to you by your health care provider. Make sure you discuss any questions you have with your health care provider. Document Revised: 02/05/2018 Document Reviewed: 02/05/2018 [employer] Patient Education  [place] Ask your health care provider which exercises are safe for you. Do exercises exactly as told by your health care provider and adjust them as directed. It is normal to feel mild stretching, pulling, tightness, or discomfort as you do these exercises. Stop right away if you feel sudden pain or your pain gets worse. Do not begin these exercises until told by your health care provider. Stretching and range-of-motion exercises These exercises warm up your muscles and joints and improve the movement and flexibility of your leg. These exercises also help to relieve pain and stiffness. Quadriceps stretch, prone  1. Lie on your  abdomen (prone position) on a firm surface, such as a bed or padded floor. 2. Bend your left / right knee and reach back to hold your ankle or pant leg. If you cannot reach your ankle or pant leg, loop a belt around your foot and grab the belt instead. 3. Gently pull your heel toward your buttocks. Your knee should not slide out to the side. You should feel a stretch in the front of your thigh and knee (quadriceps). 4. Hold this position for __________ seconds. Repeat __________ times. Complete this exercise __________ times a day. Lunge This exercise stretches the muscle in the inner thigh (adductor). 1. Stand and spread your legs about 3 ft (1 m) apart. Put your left / right leg slightly back for balance. 2. Lean away from your left / right leg by bending your other knee and shifting your weight toward your bent knee.  You may rest your hands on your thigh for balance. You should feel a stretch in your left / right inner thigh. 3. Hold this position for __________ seconds. Repeat __________ times. Complete this exercise __________ times a day. Hamstring stretch, supine  1. Lie on your back (supine position). 2. Loop a belt or towel over the ball of your left / right foot. The ball of your foot is on the walking surface, right under your toes. 3. Straighten your left / right knee and slowly pull on the belt or towel to raise your leg. Stop when you feel a gentle stretch in the back of your left / right knee or thigh (hamstrings). ? Do not let your left / right knee bend. ? Keep your other leg flat on the floor. 4. Hold this position for __________ seconds. Repeat __________ times. Complete this exercise __________ times a day. Strengthening exercises These exercises build strength and endurance in your leg. Endurance is the ability to use your muscles for a long time, even after they get tired. Wall slides This exercise strengthens the muscles in the front of your thigh and knee (quadriceps). 1. Lean your back against a smooth wall or door, and walk your feet out 18-24 inches (46-61 cm) from it. 2. Place your feet hip-width apart. 3. Slowly slide down the wall or door until your knees bend as far as told by your health care provider. Keep your knees over your heels, not your toes. Keep your knees in line with your hips. 4. Hold this position for __________ seconds. 5. Use the muscles in the front of your thigh to push yourself up to the standing position. 6. Rest for __________ seconds after each repetition. Repeat __________ times. Complete this exercise __________ times a day. Straight leg raises, side-lying This exercise is sometimes called a hip abductor exercise. It strengthens the muscles that rotate the leg at the hip and move it away from your body (hip abductors). 1. Lie on your side with your left  / right leg in the top position. Lie so your head, shoulder, hip, and knee line up. Bend your bottom knee slightly to help you balance. 2. Lift your top leg 4-6 inches (10-15 cm) while keeping your toes pointed straight ahead. 3. Hold this position for __________ seconds. 4. Slowly lower your leg to the starting position. 5. Let your muscles relax completely after each repetition. Repeat __________ times. Complete this exercise __________ times a day. Straight leg raises, prone This exercise strengthens the muscles that move the hips (hip extensors). 1. Lie on your abdomen (prone  position) on a firm surface. You can put a pillow under your hips if that is more comfortable for your lower back. 2. Squeeze your buttocks muscles and lift your left / right leg about 4-6 inches (10-15 cm). Keep your knee straight as you lift your leg. 3. Hold this position for __________ seconds. 4. Slowly lower your leg to the starting position. 5. Let your muscles relax completely after each repetition. Repeat __________ times. Complete this exercise __________ times a day. Bridge This exercise strengthens the muscles that move the hips (hip extensors). 1. Lie on your back on a firm surface with your knees bent and your feet flat on the floor. 2. Tighten your buttocks muscles and lift your bottom off the floor until the trunk of your body is level with your thighs. ? Do not arch your back. ? You should feel the muscles working in your buttocks and the back of your thighs. If you do not feel these muscles, slide your feet 1-2 inches (2.5-5 cm) farther away from your buttocks. 3. Hold this position for __________ seconds. 4. Slowly lower your hips to the starting position. 5. Let your muscles relax completely after each repetition. 6. If this exercise is too easy, try doing it with your arms crossed over your chest. Repeat __________ times. Complete this exercise __________ times a day. This information is not  intended to replace advice given to you by your health care provider. Make sure you discuss any questions you have with your health care provider. Document Revised: 06/05/2018 Document Reviewed: 04/21/2018 [employer] Patient Education  [place].

## 2019-05-28 NOTE — Progress Notes (Signed)
BP 114/73 (BP Location: Left Arm, Patient Position: Sitting, Cuff Size: Normal)   Pulse 88   Temp 98.5 F (36.9 C) (Oral)   Ht 5' 5.75" (1.67 m)   Wt 176 lb 9.6 oz (80.1 kg)   LMP 07/13/2016 (Exact Date)   SpO2 98%   BMI 28.72 kg/m    Subjective:    Patient ID: Sheila Benton, female    DOB: 12/26/1980, 39 y.o.   MRN: [id number]  HPI: Sheila Benton is a 39 y.o. female presenting on 05/28/2019 for comprehensive medical examination. Current medical complaints include:  ASTHMA Asthma status: better Satisfied with current treatment?: yes Albuterol/rescue inhaler frequency: rarely Dyspnea frequency: occasionally Wheezing frequency: occasionally Cough frequency: occasionally Nocturnal symptom frequency: never  Limitation of activity: no Current upper respiratory symptoms: no Aerochamber/spacer use: no Visits to ER or Urgent Care in past year: no Pneumovax: Post-poned due to COVID vaccine Influenza: Post-poned due to COVID vaccine  Menopausal Symptoms: no  Depression Screen done today and results listed below:  Depression screen [facility] 2/9 04/30/2019 05/06/2017 03/20/2017 12/19/2016 11/06/2016  Decreased Interest 0 0 1 2 0  Down, Depressed, Hopeless 0 0 0 0 0  PHQ - 2 Score 0 0 1 2 0  Altered sleeping - 1 - 3 3  Tired, decreased energy - 1 - 3 3  Change in appetite - 0 - 0 3  Feeling bad or failure about yourself  - 0 - 0 0  Trouble concentrating - 0 - 3 3  Moving slowly or fidgety/restless - 0 - 0 0  Suicidal thoughts - 0 - 0 0  PHQ-9 Score - 2 - 11 12  Difficult doing work/chores - Not difficult at all - Somewhat difficult -   Past Medical History:  Past Medical History:  Diagnosis Date  . ADD (attention deficit disorder)   . Anemia   . Anxiety   . Asthma    WELL CONTROLLED  . Headache    MIGRAINES  . History of kidney stones   . Irregular menses     Surgical History:  Past Surgical History:  Procedure Laterality Date  . ABDOMINAL HYSTERECTOMY    . CYSTOSCOPY   07/24/2016   Procedure: CYSTOSCOPY;  Surgeon: Gae Dry, MD;  Location: [facility];  Service: Gynecology;;  . ESOPHAGOGASTRODUODENOSCOPY (EGD) WITH PROPOFOL N/A 09/20/2017   Procedure: ESOPHAGOGASTRODUODENOSCOPY (EGD) WITH PROPOFOL;  Surgeon: Virgel Manifold, MD;  Location: [facility];  Service: Endoscopy;  Laterality: N/A;  . LAPAROSCOPIC BILATERAL SALPINGECTOMY Bilateral 07/24/2016   Procedure: LAPAROSCOPIC BILATERAL SALPINGECTOMY;  Surgeon: Gae Dry, MD;  Location: [facility];  Service: Gynecology;  Laterality: Bilateral;  . LAPAROSCOPIC HYSTERECTOMY N/A 07/24/2016   Procedure: HYSTERECTOMY TOTAL LAPAROSCOPIC;  Surgeon: Gae Dry, MD;  Location: [facility];  Service: Gynecology;  Laterality: N/A;  . TUBAL LIGATION  12/24/2003    Medications:  Current Outpatient Medications on File Prior to Visit  Medication Sig  . albuterol (PROVENTIL HFA;VENTOLIN HFA) 108 (90 Base) MCG/ACT inhaler INHALE 1 PUFF INTO THE LUNGS EVERY 6 HOURS AS NEEDED FOR WHEEZING OR SHORTNESS OF BREATH  . albuterol (PROVENTIL) (2.5 MG/3ML) 0.083% nebulizer solution VVN Q 6 H PRF WHZ OR SOB  . famotidine (PEPCID) 20 MG tablet Take 1 tablet (20 mg total) by mouth at bedtime.  . Fluticasone-Salmeterol (WIXELA INHUB) 250-50 MCG/DOSE AEPB Inhale 1 puff into the lungs 2 (two) times daily.  . montelukast (SINGULAIR) 10 MG tablet Take 1 tablet (10 mg total) by mouth  at bedtime.  Marland Kitchen omeprazole (PRILOSEC) 20 MG capsule Take 1 capsule (20 mg total) by mouth 2 (two) times daily.   No current facility-administered medications on file prior to visit.    Allergies:  Allergies  Allergen Reactions  . Armodafinil Other (See Comments)    "Shoulder ticks, back pain, increased anxiety, keeps me awake"  . Contrast Media [Iodinated Diagnostic Agents] Rash  . Omnipaque [Iohexol] Itching    Patient approximately 5 minutes after injection sneezed once and then started itching uncontrollably all over. SPM    Social  History:  Social History   Socioeconomic History  . Marital status: Married    Spouse name: Not on file  . Number of children: Not on file  . Years of education: Not on file  . Highest education level: Not on file  Occupational History  . Not on file  Tobacco Use  . Smoking status: Former Research scientist (life sciences)  . Smokeless tobacco: Never Used  Substance and Sexual Activity  . Alcohol use: No  . Drug use: No  . Sexual activity: Yes    Comment: Tubes Tied  Other Topics Concern  . Not on file  Social History Narrative  . Not on file   Social Determinants of Health   Financial Resource Strain:   . Difficulty of Paying Living Expenses:   Food Insecurity:   . Worried About Charity fundraiser in the Last Year:   . Arboriculturist in the Last Year:   Transportation Needs:   . Film/video editor (Medical):   Marland Kitchen Lack of Transportation (Non-Medical):   Physical Activity:   . Days of Exercise per Week:   . Minutes of Exercise per Session:   Stress:   . Feeling of Stress :   Social Connections:   . Frequency of Communication with Friends and Family:   . Frequency of Social Gatherings with Friends and Family:   . Attends Religious Services:   . Active Member of Clubs or Organizations:   . Attends Archivist Meetings:   Marland Kitchen Marital Status:   Intimate Partner Violence:   . Fear of Current or Ex-Partner:   . Emotionally Abused:   Marland Kitchen Physically Abused:   . Sexually Abused:    Social History   Tobacco Use  Smoking Status Former Smoker  Smokeless Tobacco Never Used   Social History   Substance and Sexual Activity  Alcohol Use No    Family History:  Family History  Problem Relation Age of Onset  . Hypertension Mother   . Cancer Father   . Endometriosis Sister   . Heart disease Maternal Grandmother   . Hypertension Maternal Grandmother   . Stroke Maternal Grandmother   . Lung cancer Maternal Grandfather     Past medical history, surgical history, medications,  allergies, family history and social history reviewed with patient today and changes made to appropriate areas of the chart.   Review of Systems  Constitutional: Negative.   HENT: Negative.   Eyes: Negative.   Respiratory: Negative.   Cardiovascular: Negative.   Gastrointestinal: Positive for heartburn. Negative for abdominal pain, blood in stool, constipation, diarrhea, melena, nausea and vomiting.  Genitourinary: Negative.   Musculoskeletal: Positive for back pain and myalgias. Negative for falls, joint pain and neck pain.  Skin: Negative.   Neurological: Negative.   Endo/Heme/Allergies: Positive for environmental allergies. Negative for polydipsia. Does not bruise/bleed easily.  Psychiatric/Behavioral: Negative for depression, hallucinations, memory loss, substance abuse and suicidal ideas. The patient  is nervous/anxious. The patient does not have insomnia.     All other ROS negative except what is listed above and in the HPI.      Objective:    BP 114/73 (BP Location: Left Arm, Patient Position: Sitting, Cuff Size: Normal)   Pulse 88   Temp 98.5 F (36.9 C) (Oral)   Ht 5' 5.75" (1.67 m)   Wt 176 lb 9.6 oz (80.1 kg)   LMP 07/13/2016 (Exact Date)   SpO2 98%   BMI 28.72 kg/m   Wt Readings from Last 3 Encounters:  05/28/19 176 lb 9.6 oz (80.1 kg)  03/02/19 175 lb (79.4 kg)  01/15/19 176 lb 4 oz (79.9 kg)    Physical Exam Vitals and nursing note reviewed. Exam conducted with a chaperone present.  Constitutional:      General: She is not in acute distress.    Appearance: Normal appearance. She is not ill-appearing, toxic-appearing or diaphoretic.  HENT:     Head: Normocephalic and atraumatic.     Right Ear: Tympanic membrane, ear canal and external ear normal. There is no impacted cerumen.     Left Ear: Tympanic membrane, ear canal and external ear normal. There is no impacted cerumen.     Nose: Nose normal. No congestion or rhinorrhea.     Mouth/Throat:     Mouth:  Mucous membranes are moist.     Pharynx: Oropharynx is clear. No oropharyngeal exudate or posterior oropharyngeal erythema.  Eyes:     General: No scleral icterus.       Right eye: No discharge.        Left eye: No discharge.     Extraocular Movements: Extraocular movements intact.     Conjunctiva/sclera: Conjunctivae normal.     Pupils: Pupils are equal, round, and reactive to light.  Neck:     Vascular: No carotid bruit.  Cardiovascular:     Rate and Rhythm: Normal rate and regular rhythm.     Pulses: Normal pulses.     Heart sounds: No murmur. No friction rub. No gallop.   Pulmonary:     Effort: Pulmonary effort is normal. No respiratory distress.     Breath sounds: Normal breath sounds. No stridor. No wheezing, rhonchi or rales.  Chest:     Chest wall: No tenderness.     Breasts:        Right: Normal. No swelling, bleeding, inverted nipple, mass, nipple discharge, skin change or tenderness.        Left: Normal. No swelling, bleeding, inverted nipple, mass, nipple discharge, skin change or tenderness.  Abdominal:     General: Abdomen is flat. Bowel sounds are normal. There is no distension.     Palpations: Abdomen is soft. There is no mass.     Tenderness: There is no abdominal tenderness. There is no right CVA tenderness, left CVA tenderness, guarding or rebound.     Hernia: No hernia is present.  Genitourinary:    Comments: Pelvic exams deferred with shared decision making Musculoskeletal:        General: No swelling, tenderness, deformity or signs of injury.     Cervical back: Normal range of motion and neck supple. No rigidity. No muscular tenderness.     Right lower leg: No edema.     Left lower leg: No edema.  Lymphadenopathy:     Cervical: No cervical adenopathy.  Skin:    General: Skin is warm and dry.     Capillary Refill: Capillary refill takes  less than 2 seconds.     Coloration: Skin is not jaundiced or pale.     Findings: No bruising, erythema, lesion or rash.   Neurological:     General: No focal deficit present.     Mental Status: She is alert and oriented to person, place, and time. Mental status is at baseline.     Cranial Nerves: No cranial nerve deficit.     Sensory: No sensory deficit.     Motor: No weakness.     Coordination: Coordination normal.     Gait: Gait normal.     Deep Tendon Reflexes: Reflexes normal.  Psychiatric:        Mood and Affect: Mood normal.        Behavior: Behavior normal.        Thought Content: Thought content normal.        Judgment: Judgment normal.     Results for orders placed or performed in visit on 04/30/19  CBC with Differential/Platelet  Result Value Ref Range   WBC 8.2 3.4 - 10.8 x10E3/uL   RBC 4.62 3.77 - 5.28 x10E6/uL   Hemoglobin 12.9 11.1 - 15.9 g/dL   Hematocrit 40.8 34.0 - 46.6 %   MCV 88 79 - 97 fL   MCH 27.9 26.6 - 33.0 pg   MCHC 31.6 31.5 - 35.7 g/dL   RDW 13.0 11.7 - 15.4 %   Platelets 368 150 - 450 x10E3/uL   Neutrophils 61 Not Estab. %   Lymphs 25 Not Estab. %   Monocytes 8 Not Estab. %   Eos 4 Not Estab. %   Basos 1 Not Estab. %   Neutrophils Absolute 5.1 1.4 - 7.0 x10E3/uL   Lymphocytes Absolute 2.0 0.7 - 3.1 x10E3/uL   Monocytes Absolute 0.7 0.1 - 0.9 x10E3/uL   EOS (ABSOLUTE) 0.3 0.0 - 0.4 x10E3/uL   Basophils Absolute 0.1 0.0 - 0.2 x10E3/uL   Immature Granulocytes 1 Not Estab. %   Immature Grans (Abs) 0.1 0.0 - 0.1 x10E3/uL  Comprehensive metabolic panel  Result Value Ref Range   Glucose 81 65 - 99 mg/dL   BUN 7 6 - 24 mg/dL   Creatinine, Ser 0.81 0.57 - 1.00 mg/dL   GFR calc non Af Amer 91 >59 mL/min/1.73   GFR calc Af Amer 105 >59 mL/min/1.73   BUN/Creatinine Ratio 9 9 - 23   Sodium 138 134 - 144 mmol/L   Potassium 4.2 3.5 - 5.2 mmol/L   Chloride 105 96 - 106 mmol/L   CO2 20 20 - 29 mmol/L   Calcium 9.1 8.7 - 10.2 mg/dL   Total Protein 6.3 6.0 - 8.5 g/dL   Albumin 3.9 3.8 - 4.8 g/dL   Globulin, Total 2.4 1.5 - 4.5 g/dL   Albumin/Globulin Ratio 1.6 1.2 -  2.2   Bilirubin Total 0.3 0.0 - 1.2 mg/dL   Alkaline Phosphatase 89 39 - 117 IU/L   AST 20 0 - 40 IU/L   ALT 35 (H) 0 - 32 IU/L  Lipid Panel w/o Chol/HDL Ratio  Result Value Ref Range   Cholesterol, Total 188 100 - 199 mg/dL   Triglycerides 153 (H) 0 - 149 mg/dL   HDL 33 (L) >39 mg/dL   VLDL Cholesterol Cal 28 5 - 40 mg/dL   LDL Chol Calc (NIH) 127 (H) 0 - 99 mg/dL  TSH  Result Value Ref Range   TSH 1.400 0.450 - 4.500 uIU/mL  UA/M w/rflx Culture, Routine   Specimen: Urine  URINE  Result Value Ref Range   Specific Gravity, UA 1.010 1.005 - 1.030   pH, UA 5.5 5.0 - 7.5   Color, UA Yellow Yellow   Appearance Ur Clear Clear   Leukocytes,UA Negative Negative   Protein,UA Negative Negative/Trace   Glucose, UA Negative Negative   Ketones, UA Negative Negative   RBC, UA Negative Negative   Bilirubin, UA Negative Negative   Urobilinogen, Ur 0.2 0.2 - 1.0 mg/dL   Nitrite, UA Negative Negative  VITAMIN D 25 Hydroxy (Vit-D Deficiency, Fractures)  Result Value Ref Range   Vit D, 25-Hydroxy 10.5 (L) 30.0 - 100.0 ng/mL  HIV Antibody (routine testing w rflx)  Result Value Ref Range   HIV Screen 4th Generation wRfx Non Reactive Non Reactive  Bayer DCA Hb A1c Waived  Result Value Ref Range   HB A1C (BAYER DCA - WAIVED) 5.6 <7.0 %      Assessment & Plan:   Problem List Items Addressed This Visit      Respiratory   Asthma    Under good control on current regimen. Continue current regimen. Continue to monitor. Call with any concerns. Refills given. Labs drawn last visit and normal.        Other Visit Diagnoses    Routine general medical examination at a health care facility    -  Primary   Vaccines on hold due to COVID-vaccine. Screening labs checked today. Mammogram ordered today. Continue diet and exercise. Call with any concerns.    Encounter for screening mammogram for malignant neoplasm of breast       Relevant Orders   MM 3D SCREEN BREAST BILATERAL       Follow up  plan: Return in about 6 months (around 11/27/2019).   LABORATORY TESTING:  - Pap smear: up to date  IMMUNIZATIONS:   - Tdap: Tetanus vaccination status reviewed: Post-poned due to COVID vaccine. - Influenza: Up to date - Pneumovax: Post-poned due to COVID vaccine  SCREENING: -Mammogram: Ordered today   PATIENT COUNSELING:   Advised to take 1 mg of folate supplement per day if capable of pregnancy.   Sexuality: Discussed sexually transmitted diseases, partner selection, use of condoms, avoidance of unintended pregnancy  and contraceptive alternatives.   Advised to avoid cigarette smoking.  I discussed with the patient that most people either abstain from alcohol or drink within safe limits (<=14/week and <=4 drinks/occasion for males, <=7/weeks and <= 3 drinks/occasion for females) and that the risk for alcohol disorders and other health effects rises proportionally with the number of drinks per week and how often a drinker exceeds daily limits.  Discussed cessation/primary prevention of drug use and availability of treatment for abuse.   Diet: Encouraged to adjust caloric intake to maintain  or achieve ideal body weight, to reduce intake of dietary saturated fat and total fat, to limit sodium intake by avoiding high sodium foods and not adding table salt, and to maintain adequate dietary potassium and calcium preferably from fresh fruits, vegetables, and low-fat dairy products.    stressed the importance of regular exercise  Injury prevention: Discussed safety belts, safety helmets, smoke detector, smoking near bedding or upholstery.   Dental health: Discussed importance of regular tooth brushing, flossing, and dental visits.    NEXT PREVENTATIVE PHYSICAL DUE IN 1 YEAR. Return in about 6 months (around 11/27/2019).

## 2019-08-12 ENCOUNTER — Ambulatory Visit
Admission: EM | Admit: 2019-08-12 | Discharge: 2019-08-12 | Disposition: A | Discharge status: Home / Self Care | Payer: Managed Care, Other (non HMO) | Attending: Family Medicine | Admitting: Family Medicine

## 2019-08-12 ENCOUNTER — Other Ambulatory Visit: Payer: Self-pay

## 2019-08-12 DIAGNOSIS — G47419 Narcolepsy without cataplexy: Secondary | ICD-10-CM | POA: Diagnosis not present

## 2019-08-12 DIAGNOSIS — J069 Acute upper respiratory infection, unspecified: Secondary | ICD-10-CM | POA: Insufficient documentation

## 2019-08-12 DIAGNOSIS — Z87891 Personal history of nicotine dependence: Secondary | ICD-10-CM | POA: Diagnosis not present

## 2019-08-12 DIAGNOSIS — Z20828 Contact with and (suspected) exposure to other viral communicable diseases: Secondary | ICD-10-CM

## 2019-08-12 DIAGNOSIS — Z79899 Other long term (current) drug therapy: Secondary | ICD-10-CM | POA: Diagnosis not present

## 2019-08-12 DIAGNOSIS — Z20822 Contact with and (suspected) exposure to covid-19: Secondary | ICD-10-CM | POA: Diagnosis not present

## 2019-08-12 DIAGNOSIS — J029 Acute pharyngitis, unspecified: Secondary | ICD-10-CM | POA: Diagnosis not present

## 2019-08-12 DIAGNOSIS — J45909 Unspecified asthma, uncomplicated: Secondary | ICD-10-CM | POA: Diagnosis not present

## 2019-08-12 DIAGNOSIS — R05 Cough: Secondary | ICD-10-CM | POA: Diagnosis not present

## 2019-08-12 LAB — SARS CORONAVIRUS 2 (TAT 6-24 HRS): SARS Coronavirus 2: NEGATIVE

## 2019-08-12 LAB — GROUP A STREP BY PCR: Group A Strep by PCR: NOT DETECTED

## 2019-08-12 MED ORDER — HYDROCOD POLST-CPM POLST ER 10-8 MG/5ML PO SUER: 5.0000 mL | Freq: Two times a day (BID) | ORAL | 0 refills | Status: DC | PRN

## 2019-08-12 NOTE — ED Triage Notes (Signed)
Pt c/o sore throat and runny nose since yesterday, Mucinex not helping.

## 2019-08-12 NOTE — ED Provider Notes (Signed)
[facility]    CSN: [id number] Arrival date & time: 08/12/19  0858      History   Chief Complaint Chief Complaint  Patient presents with  . Sore Throat  . Nasal Congestion    HPI Sheila Benton is a 39 y.o. female.   39 yo female with a c/o sore throat, runny nose, nasal congestion and cough since yesterday. States she took some mucinex last night which didn't help. Denies any fevers, chills, chest pains, shortness of breath.      Past Medical History:  Diagnosis Date  . ADD (attention deficit disorder)   . Anemia   . Anxiety   . Asthma    WELL CONTROLLED  . Headache    MIGRAINES  . History of kidney stones   . Irregular menses     Patient Active Problem List   Diagnosis Date Noted  . Malrotation of intestine 01/01/2018  . Impingement syndrome of shoulder region 11/20/2017  . Neck pain 11/20/2017  . Stomach irritation   . Hiatal hernia   . Abdominal pain, generalized   . Intractable vomiting with nausea   . Narcolepsy 04/15/2017  . Abnormal weight gain 03/25/2017  . Hypersomnia, persistent 01/23/2017  . Excessive daytime sleepiness 01/23/2017  . Fatigue 12/20/2016  . Endometriosis 07/24/2016  . Pelvic pain in female 07/24/2016  . 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  . Anxiety disorder 03/10/2015  . Asthma 02/10/2015    Past Surgical History:  Procedure Laterality Date  . ABDOMINAL HYSTERECTOMY    . CYSTOSCOPY  07/24/2016   Procedure: CYSTOSCOPY;  Surgeon: Gae Dry, MD;  Location: [facility];  Service: Gynecology;;  . ESOPHAGOGASTRODUODENOSCOPY (EGD) WITH PROPOFOL N/A 09/20/2017   Procedure: ESOPHAGOGASTRODUODENOSCOPY (EGD) WITH PROPOFOL;  Surgeon: Virgel Manifold, MD;  Location: [facility];  Service: Endoscopy;  Laterality: N/A;  . LAPAROSCOPIC BILATERAL SALPINGECTOMY Bilateral 07/24/2016   Procedure: LAPAROSCOPIC BILATERAL SALPINGECTOMY;  Surgeon: Gae Dry, MD;  Location: [facility];  Service: Gynecology;   Laterality: Bilateral;  . LAPAROSCOPIC HYSTERECTOMY N/A 07/24/2016   Procedure: HYSTERECTOMY TOTAL LAPAROSCOPIC;  Surgeon: Gae Dry, MD;  Location: [facility];  Service: Gynecology;  Laterality: N/A;  . TUBAL LIGATION  12/24/2003    OB History    Gravida  3   Para  3   Term  3   Preterm      AB      Living  3     SAB      TAB      Ectopic      Multiple      Live Births  3            Home Medications    Prior to Admission medications   Medication Sig Start Date End Date Taking? Authorizing Provider  albuterol (PROVENTIL HFA;VENTOLIN HFA) 108 (90 Base) MCG/ACT inhaler INHALE 1 PUFF INTO THE LUNGS EVERY 6 HOURS AS NEEDED FOR WHEEZING OR SHORTNESS OF BREATH 08/27/17   Volney American, PA-C  albuterol (PROVENTIL) (2.5 MG/3ML) 0.083% nebulizer solution VVN Q 6 H PRF WHZ OR SOB 02/05/17   [provider]  chlorpheniramine-HYDROcodone (TUSSIONEX PENNKINETIC ER) 10-8 MG/5ML SUER Take 5 mLs by mouth every 12 (twelve) hours as needed. 08/12/19   Norval Gable, MD  diclofenac Sodium (VOLTAREN) 1 % GEL Apply 4 g topically 4 (four) times daily. 05/28/19   Johnson, Megan P, DO  famotidine (PEPCID) 20 MG tablet Take 1 tablet (20 mg total) by mouth at bedtime.  01/15/19   Virgel Manifold, MD  Fluticasone-Salmeterol (WIXELA INHUB) 250-50 MCG/DOSE AEPB Inhale 1 puff into the lungs 2 (two) times daily. 04/30/19   Johnson, Megan P, DO  montelukast (SINGULAIR) 10 MG tablet Take 1 tablet (10 mg total) by mouth at bedtime. 04/30/19   Johnson, Megan P, DO  omeprazole (PRILOSEC) 20 MG capsule Take 1 capsule (20 mg total) by mouth 2 (two) times daily. 10/15/18 03/02/19  Virgel Manifold, MD    Family History Family History  Problem Relation Age of Onset  . Hypertension Mother   . Cancer Father   . Endometriosis Sister   . Heart disease Maternal Grandmother   . Hypertension Maternal Grandmother   . Stroke Maternal Grandmother   . Lung cancer Maternal Grandfather      Social History Social History   Tobacco Use  . Smoking status: Former Research scientist (life sciences)  . Smokeless tobacco: Never Used  Vaping Use  . Vaping Use: Every day  Substance Use Topics  . Alcohol use: No  . Drug use: No     Allergies   Armodafinil, Contrast media [iodinated diagnostic agents], and Omnipaque [iohexol]   Review of Systems Review of Systems   Physical Exam Triage Vital Signs ED Triage Vitals [08/12/19 0911]  Enc Vitals Group     BP 119/75     Pulse Rate 77     Resp 16     Temp 98.3 F (36.8 C)     Temp src      SpO2 99 %     Weight      Height      Head Circumference      Peak Flow      Pain Score 3     Pain Loc      Pain Edu?      Excl. in [place]?    No data found.  Updated Vital Signs BP 119/75   Pulse 77   Temp 98.3 F (36.8 C)   Resp 16   LMP 07/13/2016 (Exact Date)   SpO2 99%   Visual Acuity Right Eye Distance:   Left Eye Distance:   Bilateral Distance:    Right Eye Near:   Left Eye Near:    Bilateral Near:     Physical Exam Vitals and nursing note reviewed.  Constitutional:      General: She is not in acute distress.    Appearance: She is not toxic-appearing or diaphoretic.  HENT:     Right Ear: Tympanic membrane normal.     Left Ear: Tympanic membrane normal.     Nose: Congestion and rhinorrhea present.     Mouth/Throat:     Pharynx: Posterior oropharyngeal erythema present. No oropharyngeal exudate.  Cardiovascular:     Rate and Rhythm: Normal rate.     Heart sounds: Normal heart sounds.  Pulmonary:     Effort: Pulmonary effort is normal. No respiratory distress.     Breath sounds: Normal breath sounds. No stridor. No wheezing or rhonchi.  Musculoskeletal:     Cervical back: Neck supple.  Neurological:     Mental Status: She is alert.      UC Treatments / Results  Labs (all labs ordered are listed, but only abnormal results are displayed) Labs Reviewed  GROUP A STREP BY PCR  SARS CORONAVIRUS 2 (TAT 6-24 HRS)     EKG   Radiology No results found.  Procedures Procedures (including critical care time)  Medications Ordered in UC Medications - No data  to display  Initial Impression / Assessment and Plan / UC Course  I have reviewed the triage vital signs and the nursing notes.  Pertinent labs & imaging results that were available during my care of the patient were reviewed by me and considered in my medical decision making (see chart for details).      Final Clinical Impressions(s) / UC Diagnoses   Final diagnoses:  Viral URI with cough     Discharge Instructions     Tylenol/advil, salt water gargles    ED Prescriptions    Medication Sig Dispense Auth. Provider   chlorpheniramine-HYDROcodone (TUSSIONEX PENNKINETIC ER) 10-8 MG/5ML SUER Take 5 mLs by mouth every 12 (twelve) hours as needed. 60 mL Norval Gable, MD      1.diagnosis reviewed with parent 2. rx as per orders above; reviewed possible side effects, interactions, risks and benefits  3. Recommend supportive treatment as above 4. Follow-up prn if symptoms worsen or don't improve  I have reviewed the PDMP during this encounter.   Norval Gable, MD 08/12/19 1102

## 2019-08-12 NOTE — Discharge Instructions (Signed)
Tylenol/advil, salt water gargles

## 2019-09-28 ENCOUNTER — Encounter: Payer: Self-pay | Admitting: Emergency Medicine

## 2019-09-28 ENCOUNTER — Other Ambulatory Visit: Payer: Self-pay

## 2019-09-28 ENCOUNTER — Ambulatory Visit
Admission: EM | Admit: 2019-09-28 | Discharge: 2019-09-28 | Disposition: A | Discharge status: Home / Self Care | Payer: Managed Care, Other (non HMO) | Attending: Family Medicine | Admitting: Family Medicine

## 2019-09-28 DIAGNOSIS — Z8249 Family history of ischemic heart disease and other diseases of the circulatory system: Secondary | ICD-10-CM | POA: Insufficient documentation

## 2019-09-28 DIAGNOSIS — F419 Anxiety disorder, unspecified: Secondary | ICD-10-CM | POA: Insufficient documentation

## 2019-09-28 DIAGNOSIS — Z79899 Other long term (current) drug therapy: Secondary | ICD-10-CM | POA: Diagnosis not present

## 2019-09-28 DIAGNOSIS — J069 Acute upper respiratory infection, unspecified: Secondary | ICD-10-CM | POA: Diagnosis present

## 2019-09-28 DIAGNOSIS — Z87442 Personal history of urinary calculi: Secondary | ICD-10-CM | POA: Diagnosis not present

## 2019-09-28 DIAGNOSIS — Z791 Long term (current) use of non-steroidal anti-inflammatories (NSAID): Secondary | ICD-10-CM | POA: Insufficient documentation

## 2019-09-28 DIAGNOSIS — Z87891 Personal history of nicotine dependence: Secondary | ICD-10-CM | POA: Diagnosis not present

## 2019-09-28 DIAGNOSIS — Z7951 Long term (current) use of inhaled steroids: Secondary | ICD-10-CM | POA: Insufficient documentation

## 2019-09-28 DIAGNOSIS — Z20822 Contact with and (suspected) exposure to covid-19: Secondary | ICD-10-CM | POA: Insufficient documentation

## 2019-09-28 DIAGNOSIS — J4540 Moderate persistent asthma, uncomplicated: Secondary | ICD-10-CM | POA: Diagnosis not present

## 2019-09-28 DIAGNOSIS — F988 Other specified behavioral and emotional disorders with onset usually occurring in childhood and adolescence: Secondary | ICD-10-CM | POA: Insufficient documentation

## 2019-09-28 DIAGNOSIS — H6502 Acute serous otitis media, left ear: Secondary | ICD-10-CM | POA: Diagnosis not present

## 2019-09-28 DIAGNOSIS — G47419 Narcolepsy without cataplexy: Secondary | ICD-10-CM | POA: Insufficient documentation

## 2019-09-28 LAB — SARS CORONAVIRUS 2 (TAT 6-24 HRS): SARS Coronavirus 2: NEGATIVE

## 2019-09-28 MED ORDER — AMOXICILLIN 875 MG PO TABS
875.0000 mg | ORAL_TABLET | Freq: Two times a day (BID) | ORAL | 0 refills | Status: DC
Start: 2019-09-28 — End: 2019-12-07

## 2019-09-28 MED ORDER — PREDNISONE 10 MG PO TABS
ORAL_TABLET | ORAL | 0 refills | Status: DC
Start: 2019-09-28 — End: 2019-12-07

## 2019-09-28 NOTE — ED Provider Notes (Signed)
[facility]    CSN: [id number] Arrival date & time: 09/28/19  1226      History   Chief Complaint Chief Complaint  Patient presents with  . Nasal Congestion    HPI Sheila Benton is a 39 y.o. female.   39 yo female with a c/o nasal congestion, cough, sore throat, ear fullness, headache for the past 4 days. Patient has chronic asthma controlled with steroid inhaler and albuterol. Denies any fevers, chills, chest pains.      Past Medical History:  Diagnosis Date  . ADD (attention deficit disorder)   . Anemia   . Anxiety   . Asthma    WELL CONTROLLED  . Headache    MIGRAINES  . History of kidney stones   . Irregular menses     Patient Active Problem List   Diagnosis Date Noted  . Malrotation of intestine 01/01/2018  . Impingement syndrome of shoulder region 11/20/2017  . Neck pain 11/20/2017  . Stomach irritation   . Hiatal hernia   . Abdominal pain, generalized   . Intractable vomiting with nausea   . Narcolepsy 04/15/2017  . Abnormal weight gain 03/25/2017  . Hypersomnia, persistent 01/23/2017  . Excessive daytime sleepiness 01/23/2017  . Fatigue 12/20/2016  . Endometriosis 07/24/2016  . Pelvic pain in female 07/24/2016  . 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  . Anxiety disorder 03/10/2015  . Asthma 02/10/2015    Past Surgical History:  Procedure Laterality Date  . ABDOMINAL HYSTERECTOMY    . CYSTOSCOPY  07/24/2016   Procedure: CYSTOSCOPY;  Surgeon: Gae Dry, MD;  Location: [facility];  Service: Gynecology;;  . ESOPHAGOGASTRODUODENOSCOPY (EGD) WITH PROPOFOL N/A 09/20/2017   Procedure: ESOPHAGOGASTRODUODENOSCOPY (EGD) WITH PROPOFOL;  Surgeon: Virgel Manifold, MD;  Location: [facility];  Service: Endoscopy;  Laterality: N/A;  . LAPAROSCOPIC BILATERAL SALPINGECTOMY Bilateral 07/24/2016   Procedure: LAPAROSCOPIC BILATERAL SALPINGECTOMY;  Surgeon: Gae Dry, MD;  Location: [facility];  Service: Gynecology;  Laterality:  Bilateral;  . LAPAROSCOPIC HYSTERECTOMY N/A 07/24/2016   Procedure: HYSTERECTOMY TOTAL LAPAROSCOPIC;  Surgeon: Gae Dry, MD;  Location: [facility];  Service: Gynecology;  Laterality: N/A;  . TUBAL LIGATION  12/24/2003    OB History    Gravida  3   Para  3   Term  3   Preterm      AB      Living  3     SAB      TAB      Ectopic      Multiple      Live Births  3            Home Medications    Prior to Admission medications   Medication Sig Start Date End Date Taking? Authorizing Provider  albuterol (PROVENTIL HFA;VENTOLIN HFA) 108 (90 Base) MCG/ACT inhaler INHALE 1 PUFF INTO THE LUNGS EVERY 6 HOURS AS NEEDED FOR WHEEZING OR SHORTNESS OF BREATH 08/27/17  Yes Volney American, PA-C  albuterol (PROVENTIL) (2.5 MG/3ML) 0.083% nebulizer solution VVN Q 6 H PRF WHZ OR SOB 02/05/17  Yes [provider]  diclofenac Sodium (VOLTAREN) 1 % GEL Apply 4 g topically 4 (four) times daily. 05/28/19  Yes Johnson, Megan P, DO  famotidine (PEPCID) 20 MG tablet Take 1 tablet (20 mg total) by mouth at bedtime. 01/15/19  Yes Virgel Manifold, MD  fluticasone-salmeterol (ADVAIR HFA) 230-21 MCG/ACT inhaler Inhale 2 puffs into the lungs 2 (two) times daily.   Yes [provider]  montelukast (SINGULAIR) 10 MG tablet Take 1 tablet (10 mg total) by mouth at bedtime. 04/30/19  Yes Johnson, Megan P, DO  omeprazole (PRILOSEC) 20 MG capsule Take 1 capsule (20 mg total) by mouth 2 (two) times daily. 10/15/18 09/28/19 Yes Virgel Manifold, MD  amoxicillin (AMOXIL) 875 MG tablet Take 1 tablet (875 mg total) by mouth 2 (two) times daily. 09/28/19   Norval Gable, MD  chlorpheniramine-HYDROcodone (TUSSIONEX PENNKINETIC ER) 10-8 MG/5ML SUER Take 5 mLs by mouth every 12 (twelve) hours as needed. 08/12/19   Norval Gable, MD  Fluticasone-Salmeterol [facility] INHUB) 250-50 MCG/DOSE AEPB Inhale 1 puff into the lungs 2 (two) times daily. 04/30/19   Johnson, Megan P, DO  predniSONE  (DELTASONE) 10 MG tablet Start 60 mg po day one, then 50 mg po day two, taper by 10 mg daily until complete. 09/28/19   Norval Gable, MD    Family History Family History  Problem Relation Age of Onset  . Hypertension Mother   . Cancer Father   . Endometriosis Sister   . Heart disease Maternal Grandmother   . Hypertension Maternal Grandmother   . Stroke Maternal Grandmother   . Lung cancer Maternal Grandfather     Social History Social History   Tobacco Use  . Smoking status: Former Research scientist (life sciences)  . Smokeless tobacco: Never Used  Vaping Use  . Vaping Use: Every day  Substance Use Topics  . Alcohol use: No  . Drug use: No     Allergies   Armodafinil, Contrast media [iodinated diagnostic agents], and Omnipaque [iohexol]   Review of Systems Review of Systems   Physical Exam Triage Vital Signs ED Triage Vitals  Enc Vitals Group     BP 09/28/19 1244 120/85     Pulse Rate 09/28/19 1244 82     Resp 09/28/19 1244 18     Temp 09/28/19 1244 98.3 F (36.8 C)     Temp Source 09/28/19 1244 Oral     SpO2 09/28/19 1244 100 %     Weight 09/28/19 1242 176 lb 9.4 oz (80.1 kg)     Height 09/28/19 1242 5' 5.75" (1.67 m)     Head Circumference --      Peak Flow --      Pain Score --      Pain Loc --      Pain Edu? --      Excl. in [place]? --    No data found.  Updated Vital Signs BP 120/85 (BP Location: Left Arm)   Pulse 82   Temp 98.3 F (36.8 C) (Oral)   Resp 18   Ht 5' 5.75" (1.67 m)   Wt 80.1 kg   LMP 07/13/2016 (Exact Date)   SpO2 100%   BMI 28.72 kg/m   Visual Acuity Right Eye Distance:   Left Eye Distance:   Bilateral Distance:    Right Eye Near:   Left Eye Near:    Bilateral Near:     Physical Exam Vitals and nursing note reviewed.  Constitutional:      General: She is not in acute distress.    Appearance: She is not toxic-appearing or diaphoretic.  HENT:     Left Ear: A middle ear effusion is present. Tympanic membrane is erythematous and bulging.      Nose: Congestion and rhinorrhea present.     Mouth/Throat:     Pharynx: Posterior oropharyngeal erythema present. No oropharyngeal exudate.  Cardiovascular:     Heart sounds: Normal  heart sounds.  Pulmonary:     Effort: Pulmonary effort is normal. No respiratory distress.     Breath sounds: Normal breath sounds. No stridor. No wheezing, rhonchi or rales.  Musculoskeletal:     Cervical back: Neck supple.  Neurological:     Mental Status: She is alert.      UC Treatments / Results  Labs (all labs ordered are listed, but only abnormal results are displayed) Labs Reviewed  SARS CORONAVIRUS 2 (TAT 6-24 HRS)    EKG   Radiology No results found.  Procedures Procedures (including critical care time)  Medications Ordered in UC Medications - No data to display  Initial Impression / Assessment and Plan / UC Course  I have reviewed the triage vital signs and the nursing notes.  Pertinent labs & imaging results that were available during my care of the patient were reviewed by me and considered in my medical decision making (see chart for details).      Final Clinical Impressions(s) / UC Diagnoses   Final diagnoses:  Viral URI with cough  Acute serous otitis media of left ear, recurrence not specified  Moderate persistent asthma, unspecified whether complicated    ED Prescriptions    Medication Sig Dispense Auth. Provider   amoxicillin (AMOXIL) 875 MG tablet Take 1 tablet (875 mg total) by mouth 2 (two) times daily. 20 tablet Norval Gable, MD   predniSONE (DELTASONE) 10 MG tablet Start 60 mg po day one, then 50 mg po day two, taper by 10 mg daily until complete. 21 tablet Norval Gable, MD      1.  diagnosis reviewed with patient 2. rx as per orders above; reviewed possible side effects, interactions, risks and benefits  3. Recommend supportive treatment with rest, fluids, otc meds prn 4. Follow-up prn if symptoms worsen or don't improve  PDMP not reviewed this  encounter.   Norval Gable, MD 09/28/19 [phone]

## 2019-09-28 NOTE — ED Triage Notes (Signed)
Pt c/o nasal congestion, cough, headache and sore throat. Started about 3 days ago. Denies fever. She had covid vaccines.

## 2019-11-04 ENCOUNTER — Encounter: Payer: Self-pay | Admitting: Family Medicine

## 2019-11-10 ENCOUNTER — Other Ambulatory Visit: Payer: Self-pay | Admitting: Family Medicine

## 2019-11-10 NOTE — Telephone Encounter (Signed)
Requested medications are due for refill today? Yes  Requested medications are on active medication list? Yes  Last Refill:  04/30/2019  # 60 each with 3 refills   Future visit scheduled? Yes in 3 weeks.    Notes to Clinic:  Please see message below.    Why Am I Seeing These Alternatives?   ADVAIR DISKUS 250-50 MCG/DOSE Sheila Benton Med Name: ADVAIR DISKUS 250/50MCG (YELLOW) 60] is not on the preferred formulary for the patient's insurance plan. Below are alternatives which are likely to be more affordable. Do not assume that every medication presented is a clinically appropriate alternative.  These alternatives were suggested by the patient's pharmacy benefit manager.

## 2019-11-11 NOTE — Telephone Encounter (Signed)
Routing to provider  

## 2019-11-16 ENCOUNTER — Other Ambulatory Visit: Payer: Self-pay | Admitting: Family Medicine

## 2019-11-16 ENCOUNTER — Encounter: Payer: Self-pay | Admitting: Family Medicine

## 2019-11-16 MED ORDER — FLUTICASONE-SALMETEROL 250-50 MCG/DOSE IN AEPB: 1.0000 | INHALATION_SPRAY | Freq: Two times a day (BID) | RESPIRATORY_TRACT | 1 refills | Status: DC

## 2019-12-01 ENCOUNTER — Ambulatory Visit: Payer: Managed Care, Other (non HMO) | Admitting: Family Medicine

## 2019-12-07 ENCOUNTER — Other Ambulatory Visit: Payer: Self-pay

## 2019-12-07 ENCOUNTER — Ambulatory Visit: Payer: Managed Care, Other (non HMO) | Admitting: Family Medicine

## 2019-12-07 ENCOUNTER — Encounter: Payer: Self-pay | Admitting: Family Medicine

## 2019-12-07 VITALS — BP 114/75 | HR 91 | Temp 98.0°F | Resp 16 | Ht 66.0 in | Wt 179.0 lb

## 2019-12-07 DIAGNOSIS — R635 Abnormal weight gain: Secondary | ICD-10-CM | POA: Diagnosis not present

## 2019-12-07 DIAGNOSIS — J4540 Moderate persistent asthma, uncomplicated: Secondary | ICD-10-CM

## 2019-12-07 MED ORDER — ALBUTEROL SULFATE (2.5 MG/3ML) 0.083% IN NEBU: INHALATION_SOLUTION | RESPIRATORY_TRACT | 3 refills | Status: DC

## 2019-12-07 MED ORDER — OMEPRAZOLE 20 MG PO CPDR: 20.0000 mg | DELAYED_RELEASE_CAPSULE | Freq: Two times a day (BID) | ORAL | 1 refills | Status: AC

## 2019-12-07 MED ORDER — FLUTICASONE-SALMETEROL 230-21 MCG/ACT IN AERO: 2.0000 | INHALATION_SPRAY | Freq: Two times a day (BID) | RESPIRATORY_TRACT | 3 refills | Status: DC

## 2019-12-07 MED ORDER — MONTELUKAST SODIUM 10 MG PO TABS: 10.0000 mg | ORAL_TABLET | Freq: Every day | ORAL | 3 refills | Status: DC

## 2019-12-07 MED ORDER — FAMOTIDINE 20 MG PO TABS: 20.0000 mg | ORAL_TABLET | Freq: Every day | ORAL | 1 refills | Status: AC

## 2019-12-07 MED ORDER — ALBUTEROL SULFATE HFA 108 (90 BASE) MCG/ACT IN AERS: INHALATION_SPRAY | RESPIRATORY_TRACT | 6 refills | Status: DC

## 2019-12-07 NOTE — Assessment & Plan Note (Signed)
Under good control on current regimen. Continue current regimen. Continue to monitor. Call with any concerns. Refills given.   

## 2019-12-07 NOTE — Progress Notes (Signed)
BP 114/75   Pulse 91   Temp 98 F (36.7 C) (Oral)   Resp 16   Ht 5\' 6"  (1.676 m)   Wt 179 lb (81.2 kg)   LMP 07/13/2016 (Exact Date)   SpO2 96%   BMI 28.89 kg/m    Subjective:    Patient ID: Sheila Benton, female    DOB: 1980-03-14, 39 y.o.   MRN: [id number]  HPI: Sheila Benton is a 38 y.o. female  Chief Complaint  Patient presents with  . Asthma    6 month follow up   ASTHMA Asthma status: controlled Satisfied with current treatment?: yes Albuterol/rescue inhaler frequency: couple of times a day Dyspnea frequency: occasionally Wheezing frequency:rarely Cough frequency: rarely Nocturnal symptom frequency: none  Limitation of activity: no Current upper respiratory symptoms: no Triggers: allergies Aerochamber/spacer use: yes Visits to ER or Urgent Care in past year: no Pneumovax: Up to Date Influenza: Up to Date  WEIGHT GAIN- weight gain has been making her depressed, messing with her sex drive. Not feeling well at atll Duration: chronic Previous attempts at weight loss: yes Complications of obesity: depression Peak weight: current Weight loss goal: to be healthy Weight loss to date: none Requesting obesity pharmacotherapy: no Current weight loss supplements/medications: no Previous weight loss supplements/meds: no Calories:    Relevant past medical, surgical, family and social history reviewed and updated as indicated. Interim medical history since our last visit reviewed. Allergies and medications reviewed and updated.  Review of Systems  Constitutional: Negative.   Respiratory: Negative.   Cardiovascular: Negative.   Gastrointestinal: Negative.   Musculoskeletal: Negative.   Psychiatric/Behavioral: Negative.     Per HPI unless specifically indicated above     Objective:    BP 114/75   Pulse 91   Temp 98 F (36.7 C) (Oral)   Resp 16   Ht 5\' 6"  (1.676 m)   Wt 179 lb (81.2 kg)   LMP 07/13/2016 (Exact Date)   SpO2 96%   BMI 28.89 kg/m    Wt Readings from Last 3 Encounters:  12/07/19 179 lb (81.2 kg)  09/28/19 176 lb 9.4 oz (80.1 kg)  05/28/19 176 lb 9.6 oz (80.1 kg)    Physical Exam Vitals and nursing note reviewed.  Constitutional:      General: She is not in acute distress.    Appearance: Normal appearance. She is not ill-appearing, toxic-appearing or diaphoretic.  HENT:     Head: Normocephalic and atraumatic.     Right Ear: External ear normal.     Left Ear: External ear normal.     Nose: Nose normal.     Mouth/Throat:     Mouth: Mucous membranes are moist.     Pharynx: Oropharynx is clear.  Eyes:     General: No scleral icterus.       Right eye: No discharge.        Left eye: No discharge.     Extraocular Movements: Extraocular movements intact.     Conjunctiva/sclera: Conjunctivae normal.     Pupils: Pupils are equal, round, and reactive to light.  Cardiovascular:     Rate and Rhythm: Normal rate and regular rhythm.     Pulses: Normal pulses.     Heart sounds: Normal heart sounds. No murmur heard.  No friction rub. No gallop.   Pulmonary:     Effort: Pulmonary effort is normal. No respiratory distress.     Breath sounds: Normal breath sounds. No stridor. No wheezing, rhonchi or  rales.  Chest:     Chest wall: No tenderness.  Musculoskeletal:        General: Normal range of motion.     Cervical back: Normal range of motion and neck supple.  Skin:    General: Skin is warm and dry.     Capillary Refill: Capillary refill takes less than 2 seconds.     Coloration: Skin is not jaundiced or pale.     Findings: No bruising, erythema, lesion or rash.  Neurological:     General: No focal deficit present.     Mental Status: She is alert and oriented to person, place, and time. Mental status is at baseline.  Psychiatric:        Mood and Affect: Mood normal.        Behavior: Behavior normal.        Thought Content: Thought content normal.        Judgment: Judgment normal.     Results for orders placed  or performed during the hospital encounter of 09/28/19  SARS CORONAVIRUS 2 (TAT 6-24 HRS) Nasopharyngeal Nasopharyngeal Swab   Specimen: Nasopharyngeal Swab  Result Value Ref Range   SARS Coronavirus 2 NEGATIVE NEGATIVE      Assessment & Plan:   Problem List Items Addressed This Visit      Respiratory   Asthma - Primary    Under good control on current regimen. Continue current regimen. Continue to monitor. Call with any concerns. Refills given.        Relevant Medications   montelukast (SINGULAIR) 10 MG tablet   albuterol (VENTOLIN HFA) 108 (90 Base) MCG/ACT inhaler   albuterol (PROVENTIL) (2.5 MG/3ML) 0.083% nebulizer solution   fluticasone-salmeterol (ADVAIR HFA) 230-21 MCG/ACT inhaler   Other Relevant Orders   CBC with Differential/Platelet    Other Visit Diagnoses    Weight gain       Will check labs and get her into nutrition. Call wiht any concerns. Continue to monitor.    Relevant Orders   Comprehensive metabolic panel   Bayer DCA Hb A1c Waived   CBC with Differential/Platelet   TSH   LH   Estradiol   FSH   Testosterone, free, total([facility])   Amb ref to Medical Nutrition Therapy-MNT       Follow up plan: Return in about 6 months (around 06/06/2020) for physical.

## 2019-12-08 LAB — BAYER DCA HB A1C WAIVED: HB A1C (BAYER DCA - WAIVED): 5 % (ref <7.0)

## 2019-12-11 LAB — TESTOSTERONE, FREE, TOTAL, SHBG
Sex Hormone Binding: 18.9 nmol/L — ABNORMAL LOW (ref 24.6–122.0)
Testosterone, Free: 0.5 pg/mL (ref 0.0–4.2)
Testosterone: 3 ng/dL — ABNORMAL LOW (ref 8–60)

## 2019-12-11 LAB — COMPREHENSIVE METABOLIC PANEL
ALT: 18 IU/L (ref 0–32)
AST: 15 IU/L (ref 0–40)
Albumin/Globulin Ratio: 1.7 (ref 1.2–2.2)
Albumin: 4.3 g/dL (ref 3.8–4.8)
Alkaline Phosphatase: 93 IU/L (ref 44–121)
BUN/Creatinine Ratio: 12 (ref 9–23)
BUN: 8 mg/dL (ref 6–24)
Bilirubin Total: <0.2 mg/dL (ref 0.0–1.2)
CO2: 22 mmol/L (ref 20–29)
Calcium: 9 mg/dL (ref 8.7–10.2)
Chloride: 103 mmol/L (ref 96–106)
Creatinine, Ser: 0.68 mg/dL (ref 0.57–1.00)
GFR calc Af Amer: 127 mL/min/{1.73_m2} (ref >59)
GFR calc non Af Amer: 110 mL/min/{1.73_m2} (ref >59)
Globulin, Total: 2.6 g/dL (ref 1.5–4.5)
Glucose: 82 mg/dL (ref 65–99)
Potassium: 3.9 mmol/L (ref 3.5–5.2)
Sodium: 137 mmol/L (ref 134–144)
Total Protein: 6.9 g/dL (ref 6.0–8.5)

## 2019-12-11 LAB — CBC WITH DIFFERENTIAL/PLATELET
Basophils Absolute: 0.1 10*3/uL (ref 0.0–0.2)
Basos: 1 %
EOS (ABSOLUTE): 0.4 10*3/uL (ref 0.0–0.4)
Eos: 3 %
Hematocrit: 37.4 % (ref 34.0–46.6)
Hemoglobin: 12.5 g/dL (ref 11.1–15.9)
Immature Grans (Abs): 0.1 10*3/uL (ref 0.0–0.1)
Immature Granulocytes: 1 %
Lymphocytes Absolute: 3.1 10*3/uL (ref 0.7–3.1)
Lymphs: 25 %
MCH: 28.7 pg (ref 26.6–33.0)
MCHC: 33.4 g/dL (ref 31.5–35.7)
MCV: 86 fL (ref 79–97)
Monocytes Absolute: 1 10*3/uL — ABNORMAL HIGH (ref 0.1–0.9)
Monocytes: 8 %
Neutrophils Absolute: 7.9 10*3/uL — ABNORMAL HIGH (ref 1.4–7.0)
Neutrophils: 62 %
Platelets: 403 10*3/uL (ref 150–450)
RBC: 4.36 x10E6/uL (ref 3.77–5.28)
RDW: 12.8 % (ref 11.7–15.4)
WBC: 12.5 10*3/uL — ABNORMAL HIGH (ref 3.4–10.8)

## 2019-12-11 LAB — LUTEINIZING HORMONE: LH: 1.5 m[IU]/mL

## 2019-12-11 LAB — FOLLICLE STIMULATING HORMONE: FSH: 1.7 m[IU]/mL

## 2019-12-11 LAB — TSH: TSH: 2.18 u[IU]/mL (ref 0.450–4.500)

## 2019-12-11 LAB — ESTRADIOL: Estradiol: 99.3 pg/mL

## 2019-12-14 ENCOUNTER — Encounter: Payer: Self-pay | Admitting: Family Medicine

## 2019-12-30 ENCOUNTER — Ambulatory Visit: Payer: Managed Care, Other (non HMO) | Admitting: Dietician

## 2020-01-05 ENCOUNTER — Other Ambulatory Visit: Payer: Self-pay

## 2020-01-05 ENCOUNTER — Encounter: Payer: Self-pay | Admitting: Dietician

## 2020-01-05 ENCOUNTER — Encounter: Payer: Managed Care, Other (non HMO) | Attending: Family Medicine | Admitting: Dietician

## 2020-01-05 VITALS — Ht 66.0 in | Wt 179.1 lb

## 2020-01-05 DIAGNOSIS — R635 Abnormal weight gain: Secondary | ICD-10-CM | POA: Diagnosis not present

## 2020-01-05 NOTE — Progress Notes (Signed)
Medical Nutrition Therapy: Visit start time: 1630  end time: 1730  Assessment:  Diagnosis: weight gain Past medical history: partial hysterectomy, asthma Psychosocial issues/ stress concerns: pt rates stress level as "moderate" and feels "ok" about stress management skills   Preferred learning method:   Auditory  Visual  Hands-on  Current weight: 179.1 lbs  Height: 5'6" Medications, supplements: reconciled in medical record   Progress and evaluation:   Pt reports noticing unintentional weight gain after her partial hysterectomy about 3 years ago; previous average weight 130-150 lbs   Pt reports functional GI condition that has not been diagnosed yet; pt states symptoms include painful cramping, bloating, and intermittent constipation and diarrhea  Pt has tried tracking symptoms and food intake in the past to determine if there were trends based on dietary intake; pt did not notice any patterns   Pt reports she has been tested for crohn's disease along with other GI conditions which have come back negative, pt states if malrotation of the intestines is her diagnosis treatment is surgery     Pt reports she is a picky eater  Favorite foods- pork   Avoided foods- all fruit, except occasionally apples; dairy and eggs d/t intolerance, fish/seafood   Physical activity: 30 min walking, 5x/week  Dietary Intake:  Usual eating pattern includes 3 meals and 0-1 snacks per day. Dining out frequency: 4 meals per week.  Breakfast: pork sausage biscuit; bowl of cereal with 2% milk  Lunch: [employer] pasta (Tuesday/thursday); chips; [employer] Supper: sit down restaurant- [employer] fried chicken; meat, two veggies and starch  Snack: leftovers, chicken nuggets, fries, ramen Beverages: 1 can mountain dew, 48-64 oz water, 1 glass pepsi when dining out   Nutrition Care Education: Basic nutrition: basic food groups, appropriate nutrient balance, appropriate meal and snack  schedule, general nutrition guidelines    Weight control: importance of low sugar and low fat choices, portion control strategies  Advanced nutrition:  recipe modification, cooking techniques, dining out, food label reading  Nutritional Diagnosis:  NB-1.1 Food and nutrition-related knowledge deficit As related to prior exposure to incomplete nutrtion-related information.  As evidenced by pt discussion and diet recall.  Intervention:  Discussion and instruction as noted above.  Pt resistant to modifying eating and drinking habits, even in small increments.  Pt may not be ready for lifestyle changes at this time.  Discussed some small changes that may be helpful with increasing healthy habits.  Pt will reach out when ready to set up a follow up appointment after the holidays. Recommendations below:   Increase fruit and vegetable intake   Incorporate vegetables at lunch and dinner   Incorporate more F/V at snacks  Try frozen veggies that come in microwaveable pouches (may be tender than fresh and low prep time)  Try canned fruit NOT in heavy syrup (mandarin oranges, peaches, pears, applesauce) for snacks + protein (cheese, PB)  Smoothies may be easier to consume than chewing F/V   Decrease sugar-sweetened beverage consumption   Switch from sweet to unsweet tea with sugar substitute   Switch from regular to diet soda  Drink at least 64oz water daily (add crystal light, lemon, or mio as needed)   Maintain physical activity   150 minutes per week is recommendation    Increase fiber intake   Eat at least 3 servings of whole grains a day  Increase fruit and vegetable intake  Increase fluid intake   Drink at least 64oz water daily   Decrease sodium intake  Reduce sodium intake to recommended 1500mg /day   Read nutrition labels on packaged foods to monitor sodium intake   400-600 mg/meal  <200 mg/snack   Decrease fast food frequency   Avoid processed meats    Decrease  saturated fat intake   Try more plant-based sources of protein  Limit processed meats   Switch to low fat dairy products   Education Materials given:   10 healthy diet changes   [employer]  Recipes- [email or web address], [email or web address]   Goals/ instructions  Learner/ who was taught:   Patient   Level of understanding:  Verbalizes/ demonstrates competency  Demonstrated degree of understanding via:   Teach back Learning barriers:  None  Willingness to learn/ readiness for change:  Hesitance, contemplating change  Monitoring and Evaluation:  Dietary intake, exercise, and body weight      follow up: prn

## 2020-03-05 ENCOUNTER — Other Ambulatory Visit: Payer: Self-pay

## 2020-03-05 ENCOUNTER — Ambulatory Visit
Admission: RE | Admit: 2020-03-05 | Discharge: 2020-03-05 | Disposition: A | Discharge status: Home / Self Care | Payer: Managed Care, Other (non HMO) | Source: Ambulatory Visit | Attending: Physician Assistant | Admitting: Physician Assistant

## 2020-03-05 VITALS — BP 123/71 | HR 84 | Temp 97.7°F | Resp 14 | Ht 66.0 in | Wt 170.0 lb

## 2020-03-05 DIAGNOSIS — H6983 Other specified disorders of Eustachian tube, bilateral: Secondary | ICD-10-CM

## 2020-03-05 DIAGNOSIS — J309 Allergic rhinitis, unspecified: Secondary | ICD-10-CM

## 2020-03-05 MED ORDER — PREDNISONE 10 MG PO TABS
ORAL_TABLET | ORAL | 0 refills | Status: DC
Start: 2020-03-05 — End: 2020-06-06

## 2020-03-05 NOTE — ED Triage Notes (Signed)
Pt c/o of bilateral ear pain x 1 week, no drainage.

## 2020-03-05 NOTE — ED Provider Notes (Signed)
[facility]    CSN: [id number] Arrival date & time: 03/05/20  0950      History   Chief Complaint Chief Complaint  Patient presents with  . Otalgia    bilateral    HPI Sheila Benton is a 40 y.o. female presenting for bilateral ear pain for about a week.  Patient states that the ear pain comes and goes.  She is also been having sinus pressure and nasal congestion.  Patient mitts to history of significant allergies.  States she takes Singulair, Claritin, Benadryl daily.  Does not tolerate nasal sprays well.  Patient admits to frequent ear infections and sinus infections.  Denies any associated fever, fatigue, body aches.  Denies any worsening cough.  No breathing difficulty or weakness. Patient denies any COVID-19 exposure and has been fully vaccinated and boosted for COVID-19.  No other complaints or concerns.  HPI  Past Medical History:  Diagnosis Date  . ADD (attention deficit disorder)   . Anemia   . Anxiety   . Asthma    WELL CONTROLLED  . Headache    MIGRAINES  . History of kidney stones   . Irregular menses     Patient Active Problem List   Diagnosis Date Noted  . Malrotation of intestine 01/01/2018  . Impingement syndrome of shoulder region 11/20/2017  . Neck pain 11/20/2017  . Stomach irritation   . Hiatal hernia   . Abdominal pain, generalized   . Intractable vomiting with nausea   . Narcolepsy 04/15/2017  . Abnormal weight gain 03/25/2017  . Hypersomnia, persistent 01/23/2017  . Excessive daytime sleepiness 01/23/2017  . Fatigue 12/20/2016  . Endometriosis 07/24/2016  . Pelvic pain in female 07/24/2016  . 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  . Anxiety disorder 03/10/2015  . Asthma 02/10/2015    Past Surgical History:  Procedure Laterality Date  . ABDOMINAL HYSTERECTOMY    . CYSTOSCOPY  07/24/2016   Procedure: CYSTOSCOPY;  Surgeon: Gae Dry, MD;  Location: [facility];  Service: Gynecology;;  . ESOPHAGOGASTRODUODENOSCOPY  (EGD) WITH PROPOFOL N/A 09/20/2017   Procedure: ESOPHAGOGASTRODUODENOSCOPY (EGD) WITH PROPOFOL;  Surgeon: Virgel Manifold, MD;  Location: [facility];  Service: Endoscopy;  Laterality: N/A;  . LAPAROSCOPIC BILATERAL SALPINGECTOMY Bilateral 07/24/2016   Procedure: LAPAROSCOPIC BILATERAL SALPINGECTOMY;  Surgeon: Gae Dry, MD;  Location: [facility];  Service: Gynecology;  Laterality: Bilateral;  . LAPAROSCOPIC HYSTERECTOMY N/A 07/24/2016   Procedure: HYSTERECTOMY TOTAL LAPAROSCOPIC;  Surgeon: Gae Dry, MD;  Location: [facility];  Service: Gynecology;  Laterality: N/A;  . TUBAL LIGATION  12/24/2003    OB History    Gravida  3   Para  3   Term  3   Preterm      AB      Living  3     SAB      IAB      Ectopic      Multiple      Live Births  3            Home Medications    Prior to Admission medications   Medication Sig Start Date End Date Taking? Authorizing Provider  albuterol (PROVENTIL) (2.5 MG/3ML) 0.083% nebulizer solution VVN Q 6 H PRF WHZ OR SOB 12/07/19  Yes Johnson, Megan P, DO  albuterol (VENTOLIN HFA) 108 (90 Base) MCG/ACT inhaler INHALE 1 PUFF INTO THE LUNGS EVERY 6 HOURS AS NEEDED FOR WHEEZING OR SHORTNESS OF BREATH 12/07/19  Yes Johnson, Megan P, DO  famotidine (PEPCID) 20 MG tablet Take 1 tablet (20 mg total) by mouth at bedtime. 12/07/19  Yes Johnson, Megan P, DO  fluticasone-salmeterol (ADVAIR HFA) 230-21 MCG/ACT inhaler Inhale 2 puffs into the lungs 2 (two) times daily. 12/07/19  Yes Johnson, Megan P, DO  montelukast (SINGULAIR) 10 MG tablet Take 1 tablet (10 mg total) by mouth at bedtime. 12/07/19  Yes Johnson, Megan P, DO  omeprazole (PRILOSEC) 20 MG capsule Take 1 capsule (20 mg total) by mouth 2 (two) times daily. 12/07/19 01/06/20 Yes Johnson, Megan P, DO  predniSONE (DELTASONE) 10 MG tablet Take 6 tablets by mouth daily on the first day and decrease by 1 tablet daily for the next 5 days 03/05/20  Yes Danton Clap, PA-C    Family  History Family History  Problem Relation Age of Onset  . Hypertension Mother   . Cancer Father   . Endometriosis Sister   . Heart disease Maternal Grandmother   . Hypertension Maternal Grandmother   . Stroke Maternal Grandmother   . Lung cancer Maternal Grandfather     Social History Social History   Tobacco Use  . Smoking status: Former Research scientist (life sciences)  . Smokeless tobacco: Never Used  Vaping Use  . Vaping Use: Every day  Substance Use Topics  . Alcohol use: No  . Drug use: No     Allergies   Armodafinil, Contrast media [iodinated diagnostic agents], and Omnipaque [iohexol]   Review of Systems Review of Systems  Constitutional: Negative for chills, diaphoresis, fatigue and fever.  HENT: Positive for congestion, ear pain, rhinorrhea and sinus pressure. Negative for sinus pain and sore throat.   Respiratory: Positive for cough. Negative for shortness of breath.   Gastrointestinal: Negative for abdominal pain, nausea and vomiting.  Musculoskeletal: Negative for arthralgias and myalgias.  Skin: Negative for rash.  Neurological: Negative for weakness and headaches.  Hematological: Negative for adenopathy.     Physical Exam Triage Vital Signs ED Triage Vitals  Enc Vitals Group     BP 03/05/20 1009 123/71     Pulse Rate 03/05/20 1009 84     Resp 03/05/20 1009 14     Temp 03/05/20 1009 97.7 F (36.5 C)     Temp Source 03/05/20 1009 Oral     SpO2 03/05/20 1009 100 %     Weight 03/05/20 1007 170 lb (77.1 kg)     Height 03/05/20 1007 5\' 6"  (1.676 m)     Head Circumference --      Peak Flow --      Pain Score 03/05/20 1006 6     Pain Loc --      Pain Edu? --      Excl. in [place]? --    No data found.  Updated Vital Signs BP 123/71 (BP Location: Left Arm)   Pulse 84   Temp 97.7 F (36.5 C) (Oral)   Resp 14   Ht 5\' 6"  (1.676 m)   Wt 170 lb (77.1 kg)   LMP 07/13/2016 (Exact Date)   SpO2 100%   BMI 27.44 kg/m       Physical Exam Vitals and nursing note reviewed.   Constitutional:      General: She is not in acute distress.    Appearance: Normal appearance. She is not ill-appearing or toxic-appearing.  HENT:     Head: Normocephalic and atraumatic.     Right Ear: Hearing, ear canal and external ear normal. A middle ear effusion is present.  Left Ear: Hearing, ear canal and external ear normal. A middle ear effusion is present.     Nose: Congestion (moderate nasal mucosal edema) and rhinorrhea (trace clear drainage) present.     Mouth/Throat:     Mouth: Mucous membranes are moist.     Pharynx: Oropharynx is clear.  Eyes:     General: No scleral icterus.       Right eye: No discharge.        Left eye: No discharge.     Conjunctiva/sclera: Conjunctivae normal.  Cardiovascular:     Rate and Rhythm: Normal rate and regular rhythm.     Heart sounds: Normal heart sounds.  Pulmonary:     Effort: Pulmonary effort is normal. No respiratory distress.     Breath sounds: Normal breath sounds.  Musculoskeletal:     Cervical back: Neck supple.  Skin:    General: Skin is dry.  Neurological:     General: No focal deficit present.     Mental Status: She is alert. Mental status is at baseline.     Motor: No weakness.     Gait: Gait normal.  Psychiatric:        Mood and Affect: Mood normal.        Behavior: Behavior normal.        Thought Content: Thought content normal.      UC Treatments / Results  Labs (all labs ordered are listed, but only abnormal results are displayed) Labs Reviewed - No data to display  EKG   Radiology No results found.  Procedures Procedures (including critical care time)  Medications Ordered in UC Medications - No data to display  Initial Impression / Assessment and Plan / UC Course  I have reviewed the triage vital signs and the nursing notes.  Pertinent labs & imaging results that were available during my care of the patient were reviewed by me and considered in my medical decision making (see chart for  details).   Patient's exam consistent with allergic sinusitis and dysfunction of both eustachian tubes.  Advised continuing medications that she has at home.  Advised considering switching to Allegra-D.  Sent prednisone as well.  Patient does not tolerate nasal sprays as she says that they have caused her to vomit.  She declines any COVID-19 testing today.  Advised her to follow-up with our department as needed for any new or worsening symptoms, but it does not appear to be a bacterial infection.   Final Clinical Impressions(s) / UC Diagnoses   Final diagnoses:  Allergic sinusitis  Dysfunction of both eustachian tubes   Discharge Instructions   None    ED Prescriptions    Medication Sig Dispense Auth. Provider   predniSONE (DELTASONE) 10 MG tablet Take 6 tablets by mouth daily on the first day and decrease by 1 tablet daily for the next 5 days 21 tablet Danton Clap, PA-C     PDMP not reviewed this encounter.   Danton Clap, PA-C 03/05/20 1122

## 2020-06-06 ENCOUNTER — Other Ambulatory Visit: Payer: Self-pay

## 2020-06-06 ENCOUNTER — Ambulatory Visit (INDEPENDENT_AMBULATORY_CARE_PROVIDER_SITE_OTHER): Payer: Managed Care, Other (non HMO) | Admitting: Family Medicine

## 2020-06-06 ENCOUNTER — Encounter: Payer: Self-pay | Admitting: Family Medicine

## 2020-06-06 VITALS — BP 113/77 | HR 89 | Temp 98.0°F | Ht 66.5 in | Wt 174.4 lb

## 2020-06-06 DIAGNOSIS — J4540 Moderate persistent asthma, uncomplicated: Secondary | ICD-10-CM

## 2020-06-06 DIAGNOSIS — Z0000 Encounter for general adult medical examination without abnormal findings: Secondary | ICD-10-CM | POA: Diagnosis not present

## 2020-06-06 LAB — URINALYSIS, ROUTINE W REFLEX MICROSCOPIC
Bilirubin, UA: NEGATIVE
Glucose, UA: NEGATIVE
Ketones, UA: NEGATIVE
Leukocytes,UA: NEGATIVE
Nitrite, UA: NEGATIVE
Protein,UA: NEGATIVE
RBC, UA: NEGATIVE
Specific Gravity, UA: 1.02 (ref 1.005–1.030)
Urobilinogen, Ur: 0.2 mg/dL (ref 0.2–1.0)
pH, UA: 6 (ref 5.0–7.5)

## 2020-06-06 MED ORDER — ALBUTEROL SULFATE HFA 108 (90 BASE) MCG/ACT IN AERS: INHALATION_SPRAY | RESPIRATORY_TRACT | 6 refills | Status: DC

## 2020-06-06 MED ORDER — ALBUTEROL SULFATE (2.5 MG/3ML) 0.083% IN NEBU: INHALATION_SOLUTION | RESPIRATORY_TRACT | 3 refills | Status: AC

## 2020-06-06 NOTE — Progress Notes (Signed)
BP 113/77   Pulse 89   Temp 98 F (36.7 C)   Ht 5' 6.5" (1.689 m)   Wt 174 lb 6.4 oz (79.1 kg)   LMP 07/13/2016 (Exact Date)   SpO2 98%   BMI 27.73 kg/m    Subjective:    Patient ID: Sheila Benton, female    DOB: 1980-05-01, 40 y.o.   MRN: [id number]  HPI: Sheila Benton is a 40 y.o. female presenting on 06/06/2020 for comprehensive medical examination. Current medical complaints include:  ASTHMA Asthma status: controlled Satisfied with current treatment?: yes Albuterol/rescue inhaler frequency: occasionally Dyspnea frequency: occasionally Wheezing frequency: occasionally Cough frequency: occasionally Nocturnal symptom frequency: never Limitation of activity: no Current upper respiratory symptoms: no Aerochamber/spacer use: no Visits to ER or Urgent Care in past year: yes Pneumovax: Declined Influenza: Up to Date  Menopausal Symptoms: no  Depression Screen done today and results listed below:  Depression screen [facility] 2/9 06/06/2020 01/05/2020 12/07/2019 05/28/2019 04/30/2019  Decreased Interest 0 0 0 1 0  Down, Depressed, Hopeless 1 0 0 1 0  PHQ - 2 Score 1 0 0 2 0  Altered sleeping - - - 2 -  Tired, decreased energy - - - 2 -  Change in appetite - - - 0 -  Feeling bad or failure about yourself  - - - 0 -  Trouble concentrating - - - 0 -  Moving slowly or fidgety/restless - - - 0 -  Suicidal thoughts - - - 0 -  PHQ-9 Score - - - 6 -  Difficult doing work/chores - - - Somewhat difficult -    Past Medical History:  Past Medical History:  Diagnosis Date  . ADD (attention deficit disorder)   . Anemia   . Anxiety   . Asthma    WELL CONTROLLED  . Headache    MIGRAINES  . History of kidney stones   . Irregular menses     Surgical History:  Past Surgical History:  Procedure Laterality Date  . ABDOMINAL HYSTERECTOMY    . CYSTOSCOPY  07/24/2016   Procedure: CYSTOSCOPY;  Surgeon: Gae Dry, MD;  Location: [facility];  Service: Gynecology;;  .  ESOPHAGOGASTRODUODENOSCOPY (EGD) WITH PROPOFOL N/A 09/20/2017   Procedure: ESOPHAGOGASTRODUODENOSCOPY (EGD) WITH PROPOFOL;  Surgeon: Virgel Manifold, MD;  Location: [facility];  Service: Endoscopy;  Laterality: N/A;  . LAPAROSCOPIC BILATERAL SALPINGECTOMY Bilateral 07/24/2016   Procedure: LAPAROSCOPIC BILATERAL SALPINGECTOMY;  Surgeon: Gae Dry, MD;  Location: [facility];  Service: Gynecology;  Laterality: Bilateral;  . LAPAROSCOPIC HYSTERECTOMY N/A 07/24/2016   Procedure: HYSTERECTOMY TOTAL LAPAROSCOPIC;  Surgeon: Gae Dry, MD;  Location: [facility];  Service: Gynecology;  Laterality: N/A;  . TUBAL LIGATION  12/24/2003    Medications:  Current Outpatient Medications on File Prior to Visit  Medication Sig  . famotidine (PEPCID) 20 MG tablet Take 1 tablet (20 mg total) by mouth at bedtime.  . fluticasone-salmeterol (ADVAIR HFA) 230-21 MCG/ACT inhaler Inhale 2 puffs into the lungs 2 (two) times daily.  . montelukast (SINGULAIR) 10 MG tablet Take 1 tablet (10 mg total) by mouth at bedtime.  Marland Kitchen omeprazole (PRILOSEC) 20 MG capsule Take 1 capsule (20 mg total) by mouth 2 (two) times daily.   No current facility-administered medications on file prior to visit.    Allergies:  Allergies  Allergen Reactions  . Armodafinil Other (See Comments)    "Shoulder ticks, back pain, increased anxiety, keeps me awake"  . Contrast Media [Iodinated [employer]] Rash  . Omnipaque [Iohexol] Itching    Patient approximately 5 minutes after injection sneezed once and then started itching uncontrollably all over. SPM    Social History:  Social History   Socioeconomic History  . Marital status: Married    Spouse name: Not on file  . Number of children: Not on file  . Years of education: Not on file  . Highest education level: Not on file  Occupational History  . Not on file  Tobacco Use  . Smoking status: Former Research scientist (life sciences)  . Smokeless tobacco: Never Used  Vaping Use  . Vaping Use:  Every day  Substance and Sexual Activity  . Alcohol use: No  . Drug use: No  . Sexual activity: Yes    Comment: Tubes Tied  Other Topics Concern  . Not on file  Social History Narrative  . Not on file   Social Determinants of Health   Financial Resource Strain: Not on file  Food Insecurity: Not on file  Transportation Needs: Not on file  Physical Activity: Not on file  Stress: Not on file  Social Connections: Not on file  Intimate Partner Violence: Not on file   Social History   Tobacco Use  Smoking Status Former Smoker  Smokeless Tobacco Never Used   Social History   Substance and Sexual Activity  Alcohol Use No    Family History:  Family History  Problem Relation Age of Onset  . Hypertension Mother   . Cancer Father   . Endometriosis Sister   . Heart disease Maternal Grandmother   . Hypertension Maternal Grandmother   . Stroke Maternal Grandmother   . Lung cancer Maternal Grandfather     Past medical history, surgical history, medications, allergies, family history and social history reviewed with patient today and changes made to appropriate areas of the chart.   Review of Systems  Constitutional: Negative.   HENT: Negative.   Eyes: Negative.   Respiratory: Negative.   Cardiovascular: Negative.   Gastrointestinal: Negative.   Genitourinary: Negative.   Musculoskeletal: Negative.   Skin: Negative.   Neurological: Negative.   Endo/Heme/Allergies: Positive for environmental allergies. Negative for polydipsia. Does not bruise/bleed easily.  Psychiatric/Behavioral: Negative.    All other ROS negative except what is listed above and in the HPI.      Objective:    BP 113/77   Pulse 89   Temp 98 F (36.7 C)   Ht 5' 6.5" (1.689 m)   Wt 174 lb 6.4 oz (79.1 kg)   LMP 07/13/2016 (Exact Date)   SpO2 98%   BMI 27.73 kg/m   Wt Readings from Last 3 Encounters:  06/06/20 174 lb 6.4 oz (79.1 kg)  03/05/20 170 lb (77.1 kg)  01/05/20 179 lb 1.6 oz (81.2  kg)    Physical Exam Vitals and nursing note reviewed.  Constitutional:      General: She is not in acute distress.    Appearance: Normal appearance. She is not ill-appearing, toxic-appearing or diaphoretic.  HENT:     Head: Normocephalic and atraumatic.     Right Ear: Tympanic membrane, ear canal and external ear normal. There is no impacted cerumen.     Left Ear: Tympanic membrane, ear canal and external ear normal. There is no impacted cerumen.     Nose: Nose normal. No congestion or rhinorrhea.     Mouth/Throat:     Mouth: Mucous membranes are moist.     Pharynx: Oropharynx is clear. No oropharyngeal exudate or  posterior oropharyngeal erythema.  Eyes:     General: No scleral icterus.       Right eye: No discharge.        Left eye: No discharge.     Extraocular Movements: Extraocular movements intact.     Conjunctiva/sclera: Conjunctivae normal.     Pupils: Pupils are equal, round, and reactive to light.  Neck:     Vascular: No carotid bruit.  Cardiovascular:     Rate and Rhythm: Normal rate and regular rhythm.     Pulses: Normal pulses.     Heart sounds: No murmur heard. No friction rub. No gallop.   Pulmonary:     Effort: Pulmonary effort is normal. No respiratory distress.     Breath sounds: Normal breath sounds. No stridor. No wheezing, rhonchi or rales.  Chest:     Chest wall: No tenderness.  Abdominal:     General: Abdomen is flat. Bowel sounds are normal. There is no distension.     Palpations: Abdomen is soft. There is no mass.     Tenderness: There is no abdominal tenderness. There is no right CVA tenderness, left CVA tenderness, guarding or rebound.     Hernia: No hernia is present.  Genitourinary:    Comments: Breast and pelvic exams deferred with shared decision making Musculoskeletal:        General: No swelling, tenderness, deformity or signs of injury.     Cervical back: Normal range of motion and neck supple. No rigidity. No muscular tenderness.      Right lower leg: No edema.     Left lower leg: No edema.  Lymphadenopathy:     Cervical: No cervical adenopathy.  Skin:    General: Skin is warm and dry.     Capillary Refill: Capillary refill takes less than 2 seconds.     Coloration: Skin is not jaundiced or pale.     Findings: No bruising, erythema, lesion or rash.  Neurological:     General: No focal deficit present.     Mental Status: She is alert and oriented to person, place, and time. Mental status is at baseline.     Cranial Nerves: No cranial nerve deficit.     Sensory: No sensory deficit.     Motor: No weakness.     Coordination: Coordination normal.     Gait: Gait normal.     Deep Tendon Reflexes: Reflexes normal.  Psychiatric:        Mood and Affect: Mood normal.        Behavior: Behavior normal.        Thought Content: Thought content normal.        Judgment: Judgment normal.     Results for orders placed or performed in visit on 12/07/19  Comprehensive metabolic panel  Result Value Ref Range   Glucose 82 65 - 99 mg/dL   BUN 8 6 - 24 mg/dL   Creatinine, Ser 0.68 0.57 - 1.00 mg/dL   GFR calc non Af Amer 110 >59 mL/min/1.73   GFR calc Af Amer 127 >59 mL/min/1.73   BUN/Creatinine Ratio 12 9 - 23   Sodium 137 134 - 144 mmol/L   Potassium 3.9 3.5 - 5.2 mmol/L   Chloride 103 96 - 106 mmol/L   CO2 22 20 - 29 mmol/L   Calcium 9.0 8.7 - 10.2 mg/dL   Total Protein 6.9 6.0 - 8.5 g/dL   Albumin 4.3 3.8 - 4.8 g/dL   Globulin, Total 2.6 1.5 -  4.5 g/dL   Albumin/Globulin Ratio 1.7 1.2 - 2.2   Bilirubin Total <0.2 0.0 - 1.2 mg/dL   Alkaline Phosphatase 93 44 - 121 IU/L   AST 15 0 - 40 IU/L   ALT 18 0 - 32 IU/L  Bayer DCA Hb A1c Waived  Result Value Ref Range   HB A1C (BAYER DCA - WAIVED) 5.0 <7.0 %  CBC with Differential/Platelet  Result Value Ref Range   WBC 12.5 (H) 3.4 - 10.8 x10E3/uL   RBC 4.36 3.77 - 5.28 x10E6/uL   Hemoglobin 12.5 11.1 - 15.9 g/dL   Hematocrit 37.4 34.0 - 46.6 %   MCV 86 79 - 97 fL   MCH  28.7 26.6 - 33.0 pg   MCHC 33.4 31.5 - 35.7 g/dL   RDW 12.8 11.7 - 15.4 %   Platelets 403 150 - 450 x10E3/uL   Neutrophils 62 Not Estab. %   Lymphs 25 Not Estab. %   Monocytes 8 Not Estab. %   Eos 3 Not Estab. %   Basos 1 Not Estab. %   Neutrophils Absolute 7.9 (H) 1.4 - 7.0 x10E3/uL   Lymphocytes Absolute 3.1 0.7 - 3.1 x10E3/uL   Monocytes Absolute 1.0 (H) 0.1 - 0.9 x10E3/uL   EOS (ABSOLUTE) 0.4 0.0 - 0.4 x10E3/uL   Basophils Absolute 0.1 0.0 - 0.2 x10E3/uL   Immature Granulocytes 1 Not Estab. %   Immature Grans (Abs) 0.1 0.0 - 0.1 x10E3/uL  TSH  Result Value Ref Range   TSH 2.180 0.450 - 4.500 uIU/mL  LH  Result Value Ref Range   LH 1.5 mIU/mL  Estradiol  Result Value Ref Range   Estradiol 99.3 pg/mL  FSH  Result Value Ref Range   FSH 1.7 mIU/mL  Testosterone, free, total([facility])  Result Value Ref Range   Testosterone 3 (L) 8 - 60 ng/dL   Testosterone, Free 0.5 0.0 - 4.2 pg/mL   Sex Hormone Binding 18.9 (L) 24.6 - 122.0 nmol/L      Assessment & Plan:   Problem List Items Addressed This Visit      Respiratory   Asthma    Under good control on current regimen. Continue current regimen. Continue to monitor. Call with any concerns. Refills given.        Relevant Medications   albuterol (VENTOLIN HFA) 108 (90 Base) MCG/ACT inhaler   albuterol (PROVENTIL) (2.5 MG/3ML) 0.083% nebulizer solution    Other Visit Diagnoses    Routine general medical examination at a health care facility    -  Primary   Vaccines up to date. Screening labs checked today. Pap UTD. Mammogram ordered today. Continue diet and exercise. Call with any concerns.    Relevant Orders   CBC with Differential/Platelet   Comprehensive metabolic panel   Lipid Panel w/o Chol/HDL Ratio   Urinalysis, Routine w reflex microscopic   TSH   Hepatitis C Antibody       Follow up plan: Return in about 6 months (around 12/06/2020).   LABORATORY TESTING:  - Pap smear: up to  date  IMMUNIZATIONS:   - Tdap: Tetanus vaccination status reviewed: Declined. - Influenza: Up to date - Pneumovax: Refused - COVID: Up to date  SCREENING: -Mammogram: Ordered today   PATIENT COUNSELING:   Advised to take 1 mg of folate supplement per day if capable of pregnancy.   Sexuality: Discussed sexually transmitted diseases, partner selection, use of condoms, avoidance of unintended pregnancy  and contraceptive alternatives.   Advised to avoid cigarette smoking.  I discussed with the patient that most people either abstain from alcohol or drink within safe limits (<=14/week and <=4 drinks/occasion for males, <=7/weeks and <= 3 drinks/occasion for females) and that the risk for alcohol disorders and other health effects rises proportionally with the number of drinks per week and how often a drinker exceeds daily limits.  Discussed cessation/primary prevention of drug use and availability of treatment for abuse.   Diet: Encouraged to adjust caloric intake to maintain  or achieve ideal body weight, to reduce intake of dietary saturated fat and total fat, to limit sodium intake by avoiding high sodium foods and not adding table salt, and to maintain adequate dietary potassium and calcium preferably from fresh fruits, vegetables, and low-fat dairy products.    stressed the importance of regular exercise  Injury prevention: Discussed safety belts, safety helmets, smoke detector, smoking near bedding or upholstery.   Dental health: Discussed importance of regular tooth brushing, flossing, and dental visits.    NEXT PREVENTATIVE PHYSICAL DUE IN 1 YEAR. Return in about 6 months (around 12/06/2020).

## 2020-06-06 NOTE — Assessment & Plan Note (Signed)
Under good control on current regimen. Continue current regimen. Continue to monitor. Call with any concerns. Refills given.   

## 2020-06-07 LAB — CBC WITH DIFFERENTIAL/PLATELET
Basophils Absolute: 0.1 10*3/uL (ref 0.0–0.2)
Basos: 1 %
EOS (ABSOLUTE): 0.3 10*3/uL (ref 0.0–0.4)
Eos: 3 %
Hematocrit: 38.1 % (ref 34.0–46.6)
Hemoglobin: 12.8 g/dL (ref 11.1–15.9)
Immature Grans (Abs): 0.1 10*3/uL (ref 0.0–0.1)
Immature Granulocytes: 1 %
Lymphocytes Absolute: 2.8 10*3/uL (ref 0.7–3.1)
Lymphs: 26 %
MCH: 28.7 pg (ref 26.6–33.0)
MCHC: 33.6 g/dL (ref 31.5–35.7)
MCV: 85 fL (ref 79–97)
Monocytes Absolute: 0.7 10*3/uL (ref 0.1–0.9)
Monocytes: 7 %
Neutrophils Absolute: 6.8 10*3/uL (ref 1.4–7.0)
Neutrophils: 62 %
Platelets: 382 10*3/uL (ref 150–450)
RBC: 4.46 x10E6/uL (ref 3.77–5.28)
RDW: 12.5 % (ref 11.7–15.4)
WBC: 10.8 10*3/uL (ref 3.4–10.8)

## 2020-06-07 LAB — LIPID PANEL W/O CHOL/HDL RATIO
Cholesterol, Total: 173 mg/dL (ref 100–199)
HDL: 37 mg/dL — ABNORMAL LOW (ref >39)
LDL Chol Calc (NIH): 110 mg/dL — ABNORMAL HIGH (ref 0–99)
Triglycerides: 147 mg/dL (ref 0–149)
VLDL Cholesterol Cal: 26 mg/dL (ref 5–40)

## 2020-06-07 LAB — COMPREHENSIVE METABOLIC PANEL
ALT: 14 IU/L (ref 0–32)
AST: 13 IU/L (ref 0–40)
Albumin/Globulin Ratio: 1.9 (ref 1.2–2.2)
Albumin: 4.4 g/dL (ref 3.8–4.8)
Alkaline Phosphatase: 96 IU/L (ref 44–121)
BUN/Creatinine Ratio: 12 (ref 9–23)
BUN: 9 mg/dL (ref 6–24)
Bilirubin Total: <0.2 mg/dL (ref 0.0–1.2)
CO2: 21 mmol/L (ref 20–29)
Calcium: 9.2 mg/dL (ref 8.7–10.2)
Chloride: 103 mmol/L (ref 96–106)
Creatinine, Ser: 0.77 mg/dL (ref 0.57–1.00)
Globulin, Total: 2.3 g/dL (ref 1.5–4.5)
Glucose: 150 mg/dL — ABNORMAL HIGH (ref 65–99)
Potassium: 4 mmol/L (ref 3.5–5.2)
Sodium: 138 mmol/L (ref 134–144)
Total Protein: 6.7 g/dL (ref 6.0–8.5)
eGFR: 99 mL/min/{1.73_m2} (ref >59)

## 2020-06-07 LAB — TSH: TSH: 1.56 u[IU]/mL (ref 0.450–4.500)

## 2020-06-07 LAB — HEPATITIS C ANTIBODY: Hep C Virus Ab: <0.1 s/co ratio (ref 0.0–0.9)

## 2020-07-01 ENCOUNTER — Other Ambulatory Visit: Payer: Self-pay | Admitting: Family Medicine

## 2020-07-01 NOTE — Telephone Encounter (Signed)
Pt stated she does not take this medication and is unsure as to why pharmacy is asking for refill.

## 2020-07-01 NOTE — Telephone Encounter (Signed)
Requested medication (s) are due for refill today: yes  Requested medication (s) are on the active medication list: no  Last refill:  05/23/20  Future visit scheduled: yes  Notes to clinic: d/c'd per Dr Anselmo Rod 12/06/20 but last refill 05/23/20    Requested Prescriptions  Pending Prescriptions Disp Refills   WIXELA INHUB 250-50 MCG/ACT AEPB [Pharmacy Med Name: WIXELA INHUB DISKUS 250/50MCG 60S] 180 each     Sig: INHALE 1 PUFF INTO THE LUNGS TWICE DAILY      Pulmonology:  Combination Products Passed - 07/01/2020  8:40 AM      Passed - Valid encounter within last 12 months    Recent Outpatient Visits           3 weeks ago Routine general medical examination at a health care facility   [facility], [place], DO   6 months ago Moderate persistent asthma without complication   [place], Megan P, DO   1 year ago Routine general medical examination at a health care facility   [facility], [place], DO   1 year ago Moderate persistent asthma without complication   [place], Megan P, DO   3 years ago Routine general medical examination at a health care facility   [place], [place], DO       Future Appointments             In 5 months Wynetta Emery, Barb Merino, DO [facility], [place]

## 2020-10-28 ENCOUNTER — Ambulatory Visit: Payer: Self-pay | Admitting: *Deleted

## 2020-10-28 ENCOUNTER — Encounter: Payer: Self-pay | Admitting: Nurse Practitioner

## 2020-10-28 ENCOUNTER — Other Ambulatory Visit: Payer: Self-pay | Admitting: Family Medicine

## 2020-10-28 ENCOUNTER — Other Ambulatory Visit: Payer: Self-pay

## 2020-10-28 ENCOUNTER — Encounter: Payer: Self-pay | Admitting: Family Medicine

## 2020-10-28 ENCOUNTER — Telehealth (INDEPENDENT_AMBULATORY_CARE_PROVIDER_SITE_OTHER): Payer: Managed Care, Other (non HMO) | Admitting: Nurse Practitioner

## 2020-10-28 VITALS — Temp 98.0°F

## 2020-10-28 DIAGNOSIS — U071 COVID-19: Secondary | ICD-10-CM

## 2020-10-28 MED ORDER — FLUTICASONE-SALMETEROL 230-21 MCG/ACT IN AERO: 2.0000 | INHALATION_SPRAY | Freq: Two times a day (BID) | RESPIRATORY_TRACT | 0 refills | Status: DC

## 2020-10-28 MED ORDER — MOLNUPIRAVIR EUA 200MG CAPSULE
4.0000 | ORAL_CAPSULE | Freq: Two times a day (BID) | ORAL | 0 refills | Status: AC
Start: 2020-10-28 — End: 2020-11-02
  Filled 2020-10-28: qty 40, 5d supply, fill #0

## 2020-10-28 NOTE — Telephone Encounter (Signed)
noted 

## 2020-10-28 NOTE — Patient Instructions (Addendum)
Molnupiravir was sent to [facility] outpatient pharmacy. There is a drug interaction between the paxlovid and your advair, and I do not want you to stop your advair. When you get there, call their phone number and they will bring the medicine to your car:   [place], [place] Hours: M-F 7:30 am - 5:30pm

## 2020-10-28 NOTE — Telephone Encounter (Signed)
Patient is calling to report that she has tested + COVID today with symptoms last night. Patient states she has fever, cough- chest congestion, SOB- asthma treatment helped, body aches. Patient advised per COVID protocol treatment/isolation. Patient is concerned about her asthma flare and would like Paxlovid treatment. Call sent to office for PCP review.

## 2020-10-28 NOTE — Assessment & Plan Note (Signed)
Positive home covid-19 test this morning with symptoms starting in the middle of the night. She has been vaccinated x3. With history of asthma, will treat with molnupiravir for 5 days. Continue tylenol and/or ibuprofen for fever and body aches and mucinex. Encourage fluids and rest. Discussed isolation for a minimum of 5 days, then another 5 days if she still has a fever or symptoms aren't improving. Work note given. F/U for worsening symptoms or concerns.

## 2020-10-28 NOTE — Telephone Encounter (Signed)
Reason for Disposition  [1] HIGH RISK for severe COVID complications (e.g., weak immune system, age > 27 years, obesity with BMI > 25, pregnant, chronic lung disease or other chronic medical condition) AND [2] COVID symptoms (e.g., cough, fever)  (Exceptions: Already seen by PCP and no new or worsening symptoms.)  Answer Assessment - Initial Assessment Questions 1. COVID-19 DIAGNOSIS: "Who made your COVID-19 diagnosis?" "Was it confirmed by a positive lab test or self-test?" If not diagnosed by a doctor (or NP/PA), ask "Are there lots of cases (community spread) where you live?" Note: See public health department website, if unsure.     + COVID- today 2. COVID-19 EXPOSURE: "Was there any known exposure to COVID before the symptoms began?" CDC Definition of close contact: within 6 feet (2 meters) for a total of 15 minutes or more over a 24-hour period.      unknown 3. ONSET: "When did the COVID-19 symptoms start?"      Last night 4. WORST SYMPTOM: "What is your worst symptom?" (e.g., cough, fever, shortness of breath, muscle aches)     SOB- heart faster 5. COUGH: "Do you have a cough?" If Yes, ask: "How bad is the cough?"       Yes- trying to be productive- not there yet 6. FEVER: "Do you have a fever?" If Yes, ask: "What is your temperature, how was it measured, and when did it start?"     Yes- 102.4 this morning- ear 7. RESPIRATORY STATUS: "Describe your breathing?" (e.g., shortness of breath, wheezing, unable to speak)      SOB- patient has asthma- she did take breathing treatment 8. BETTER-SAME-WORSE: "Are you getting better, staying the same or getting worse compared to yesterday?"  If getting worse, ask, "In what way?"     Worse- increased symptoms 9. HIGH RISK DISEASE: "Do you have any chronic medical problems?" (e.g., asthma, heart or lung disease, weak immune system, obesity, etc.)     Asthma 10. VACCINE: "Have you had the COVID-19 vaccine?" If Yes, ask: "Which one, how many shots, when  did you get it?"       Yes- pfizer  11. BOOSTER: "Have you received your COVID-19 booster?" If Yes, ask: "Which one and when did you get it?"       Yes- over 6 months 12. PREGNANCY: "Is there any chance you are pregnant?" "When was your last menstrual period?"       N/a 13. OTHER SYMPTOMS: "Do you have any other symptoms?"  (e.g., chills, fatigue, headache, loss of smell or taste, muscle pain, sore throat)       Headache, fatigue, muscle aches 14. O2 SATURATION MONITOR:  "Do you use an oxygen saturation monitor (pulse oximeter) at home?" If Yes, ask "What is your reading (oxygen level) today?" "What is your usual oxygen saturation reading?" (e.g., 95%)       no  Protocols used: Coronavirus (COVID-19) Diagnosed or Suspected-A-AH

## 2020-10-28 NOTE — Progress Notes (Signed)
Acute Office Visit  Subjective:    Patient ID: Sheila Benton, female    DOB: 07/01/1980, 40 y.o.   MRN: [id number]  Chief Complaint  Patient presents with   Covid Positive    Started feeling bad over night.  Headache cough fever body aches sob    HPI Patient is in today for fever, cough, and body aches since last night. She had a positive home covid-19 test this morning.   UPPER RESPIRATORY TRACT INFECTION Worst symptom: headache Fever: yes Cough: yes Shortness of breath: no Wheezing: no Chest pain: no Chest tightness: no Chest congestion: no Nasal congestion: yes Runny nose: yes Post nasal drip: yes Sneezing: yes Sore throat: yes Swollen glands: no Sinus pressure: no Headache: yes Face pain: no Toothache: no Ear pain: no  Ear pressure: yes bilateral Eyes red/itching:no Eye drainage/crusting: no  Vomiting: no Rash: no Fatigue: yes Sick contacts: no Strep contacts: no  Context: worse Recurrent sinusitis: no Relief with OTC cold/cough medications: no  Treatments attempted:  tylenol and mucinex    Past Medical History:  Diagnosis Date   ADD (attention deficit disorder)    Anemia    Anxiety    Asthma    WELL CONTROLLED   Headache    MIGRAINES   History of kidney stones    Irregular menses     Past Surgical History:  Procedure Laterality Date   ABDOMINAL HYSTERECTOMY     CYSTOSCOPY  07/24/2016   Procedure: CYSTOSCOPY;  Surgeon: Gae Dry, MD;  Location: [facility];  Service: Gynecology;;   ESOPHAGOGASTRODUODENOSCOPY (EGD) WITH PROPOFOL N/A 09/20/2017   Procedure: ESOPHAGOGASTRODUODENOSCOPY (EGD) WITH PROPOFOL;  Surgeon: Virgel Manifold, MD;  Location: [facility];  Service: Endoscopy;  Laterality: N/A;   LAPAROSCOPIC BILATERAL SALPINGECTOMY Bilateral 07/24/2016   Procedure: LAPAROSCOPIC BILATERAL SALPINGECTOMY;  Surgeon: Gae Dry, MD;  Location: [facility];  Service: Gynecology;  Laterality: Bilateral;   LAPAROSCOPIC HYSTERECTOMY  N/A 07/24/2016   Procedure: HYSTERECTOMY TOTAL LAPAROSCOPIC;  Surgeon: Gae Dry, MD;  Location: [facility];  Service: Gynecology;  Laterality: N/A;   TUBAL LIGATION  12/24/2003    Family History  Problem Relation Age of Onset   Hypertension Mother    Cancer Father    Endometriosis Sister    Heart disease Maternal Grandmother    Hypertension Maternal Grandmother    Stroke Maternal Grandmother    Lung cancer Maternal Grandfather     Social History   Socioeconomic History   Marital status: Married    Spouse name: Not on file   Number of children: Not on file   Years of education: Not on file   Highest education level: Not on file  Occupational History   Not on file  Tobacco Use   Smoking status: Former   Smokeless tobacco: Never  Vaping Use   Vaping Use: Every day  Substance and Sexual Activity   Alcohol use: No   Drug use: No   Sexual activity: Yes    Comment: Tubes Tied  Other Topics Concern   Not on file  Social History Narrative   Not on file   Social Determinants of Health   Financial Resource Strain: Not on file  Food Insecurity: Not on file  Transportation Needs: Not on file  Physical Activity: Not on file  Stress: Not on file  Social Connections: Not on file  Intimate Partner Violence: Not on file    Outpatient Medications Prior to Visit  Medication Sig Dispense Refill  albuterol (PROVENTIL) (2.5 MG/3ML) 0.083% nebulizer solution VVN Q 6 H PRF WHZ OR SOB 75 mL 3   albuterol (VENTOLIN HFA) 108 (90 Base) MCG/ACT inhaler INHALE 1 PUFF INTO THE LUNGS EVERY 6 HOURS AS NEEDED FOR WHEEZING OR SHORTNESS OF BREATH 8.5 g 6   famotidine (PEPCID) 20 MG tablet Take 1 tablet (20 mg total) by mouth at bedtime. 90 tablet 1   fluticasone-salmeterol (ADVAIR HFA) 230-21 MCG/ACT inhaler Inhale 2 puffs into the lungs 2 (two) times daily. 3 each 3   montelukast (SINGULAIR) 10 MG tablet Take 1 tablet (10 mg total) by mouth at bedtime. 90 tablet 3   omeprazole (PRILOSEC)  20 MG capsule Take 1 capsule (20 mg total) by mouth 2 (two) times daily. 60 capsule 1   No facility-administered medications prior to visit.    Allergies  Allergen Reactions   Armodafinil Other (See Comments)    "Shoulder ticks, back pain, increased anxiety, keeps me awake"   Contrast Media [Iodinated Diagnostic Agents] Rash   Omnipaque [Iohexol] Itching    Patient approximately 5 minutes after injection sneezed once and then started itching uncontrollably all over. SPM    Review of Systems  Constitutional:  Positive for chills, fatigue and fever.  HENT:  Positive for congestion, postnasal drip, rhinorrhea and sore throat. Negative for sinus pressure.   Eyes: Negative.   Respiratory:  Positive for cough. Negative for shortness of breath.   Cardiovascular: Negative.   Gastrointestinal: Negative.   Genitourinary: Negative.   Musculoskeletal:  Positive for myalgias.  Skin: Negative.   Neurological: Negative.       Objective:    Physical Exam Vitals and nursing note reviewed.  Constitutional:      General: She is not in acute distress.    Appearance: Normal appearance.  HENT:     Head: Normocephalic.  Eyes:     Conjunctiva/sclera: Conjunctivae normal.  Pulmonary:     Effort: Pulmonary effort is normal.     Comments: Able to talk in complete sentences Neurological:     Mental Status: She is alert and oriented to person, place, and time.  Psychiatric:        Mood and Affect: Mood normal.        Behavior: Behavior normal.        Thought Content: Thought content normal.        Judgment: Judgment normal.    Temp 98 F (36.7 C)   LMP 07/13/2016 (Exact Date)  Wt Readings from Last 3 Encounters:  06/06/20 174 lb 6.4 oz (79.1 kg)  03/05/20 170 lb (77.1 kg)  01/05/20 179 lb 1.6 oz (81.2 kg)    Health Maintenance Due  Topic Date Due   PNEUMOCOCCAL POLYSACCHARIDE VACCINE AGE 9-64 HIGH RISK  Never done   Pneumococcal Vaccine 83-65 Years old (1 - PCV) Never done    TETANUS/TDAP  11/13/2015   COVID-19 Vaccine (2 - Moderna series) 07/02/2018   INFLUENZA VACCINE  09/26/2020   PAP SMEAR-Modifier  12/14/2020    There are no preventive care reminders to display for this patient.   Lab Results  Component Value Date   TSH 1.560 06/06/2020   Lab Results  Component Value Date   WBC 10.8 06/06/2020   HGB 12.8 06/06/2020   HCT 38.1 06/06/2020   MCV 85 06/06/2020   PLT 382 06/06/2020   Lab Results  Component Value Date   NA 138 06/06/2020   K 4.0 06/06/2020   CO2 21 06/06/2020   GLUCOSE 150 (  H) 06/06/2020   BUN 9 06/06/2020   CREATININE 0.77 06/06/2020   BILITOT <0.2 06/06/2020   ALKPHOS 96 06/06/2020   AST 13 06/06/2020   ALT 14 06/06/2020   PROT 6.7 06/06/2020   ALBUMIN 4.4 06/06/2020   CALCIUM 9.2 06/06/2020   ANIONGAP 4 (L) 02/06/2013   EGFR 99 06/06/2020   Lab Results  Component Value Date   CHOL 173 06/06/2020   Lab Results  Component Value Date   HDL 37 (L) 06/06/2020   Lab Results  Component Value Date   LDLCALC 110 (H) 06/06/2020   Lab Results  Component Value Date   TRIG 147 06/06/2020   No results found for: CHOLHDL Lab Results  Component Value Date   HGBA1C 5.0 12/07/2019       Assessment & Plan:   Problem List Items Addressed This Visit       Other   COVID-19 - Primary    Positive home covid-19 test this morning with symptoms starting in the middle of the night. She has been vaccinated x3. With history of asthma, will treat with molnupiravir for 5 days. Continue tylenol and/or ibuprofen for fever and body aches and mucinex. Encourage fluids and rest. Discussed isolation for a minimum of 5 days, then another 5 days if she still has a fever or symptoms aren't improving. Work note given. F/U for worsening symptoms or concerns.       Relevant Medications   molnupiravir EUA 200 mg CAPS     Meds ordered this encounter  Medications   molnupiravir EUA 200 mg CAPS    Sig: Take 4 capsules (800 mg total) by  mouth 2 (two) times daily for 5 days.    Dispense:  40 capsule    Refill:  0    This visit was completed via MyChart due to the restrictions of the COVID-19 pandemic. All issues as above were discussed and addressed. Physical exam was done as above through visual confirmation on MyChart. If it was felt that the patient should be evaluated in the office, they were directed there. The patient verbally consented to this visit. Location of the patient: home Location of the provider: work Those involved with this call:  Provider: Vance Peper, DNP CMA: Tiffany Reel, CMA Front Desk/Registration:  Elizabeth Palau   Time spent on call:  10 minutes with patient face to face via video conference. More than 50% of this time was spent in counseling and coordination of care. 10 minutes total spent in review of patient's record and preparation of their chart.   Charyl Dancer, NP

## 2020-10-28 NOTE — Telephone Encounter (Signed)
Virtual scheduled for this morning.

## 2020-11-04 ENCOUNTER — Telehealth: Payer: Self-pay | Admitting: Family Medicine

## 2020-11-04 NOTE — Telephone Encounter (Signed)
Pt called in stating the wrong medicine was sent in for fluticasone-salmeterol (ADVAIR HFA) 230-21 MCG/ACT inhaler . But It's supposed to be the Advair disk, and asked if she could get another refill script sent over. Please advise.   [facility] [id number] - [address], [place] [address] AT [place] Phone:  [phone]  Fax:  [phone]

## 2020-11-07 MED ORDER — FLUTICASONE-SALMETEROL 250-50 MCG/ACT IN AEPB: 1.0000 | INHALATION_SPRAY | Freq: Two times a day (BID) | RESPIRATORY_TRACT | 1 refills | Status: AC

## 2020-11-07 NOTE — Telephone Encounter (Signed)
RX pended. Routing to provider for refill.

## 2020-12-06 ENCOUNTER — Ambulatory Visit: Payer: Managed Care, Other (non HMO) | Admitting: Family Medicine

## 2020-12-08 ENCOUNTER — Other Ambulatory Visit: Payer: Self-pay | Admitting: Family Medicine

## 2020-12-08 NOTE — Telephone Encounter (Signed)
Requested Prescriptions  Pending Prescriptions Disp Refills  . albuterol (VENTOLIN HFA) 108 (90 Base) MCG/ACT inhaler [Pharmacy Med Name: ALBUTEROL HFA INH (200 PUFFS)8.5GM] 8.5 g 5    Sig: INHALE 1 PUFF INTO THE LUNGS EVERY 6 HOURS AS NEEDED FOR WHEEZING OR SHORTNESS OF BREATH     Pulmonology:  Beta Agonists Failed - 12/08/2020  3:40 AM      Failed - One inhaler should last at least one month. If the patient is requesting refills earlier, contact the patient to check for uncontrolled symptoms.      Passed - Valid encounter within last 12 months    Recent Outpatient Visits          1 month ago COVID-19   [place], NP   6 months ago Routine general medical examination at a health care facility   [facility], [place] P, DO   1 year ago Moderate persistent asthma without complication   [facility] Valerie Roys, DO   1 year ago Routine general medical examination at a health care facility   [facility], [place], DO   1 year ago Moderate persistent asthma without complication   [place], Megan P, DO

## 2020-12-12 ENCOUNTER — Other Ambulatory Visit: Payer: Self-pay | Admitting: Family Medicine

## 2020-12-12 NOTE — Telephone Encounter (Signed)
Requested medication (s) are due for refill today- yes  Requested medication (s) are on the active medication list -yes  Future visit scheduled -no  Last refill: 12/07/19 #90 3RF  Notes to clinic: Request RF: expired Rx  Requested Prescriptions  Pending Prescriptions Disp Refills   montelukast (SINGULAIR) 10 MG tablet [Pharmacy Med Name: MONTELUKAST 10MG  TABLETS] 90 tablet 3    Sig: TAKE 1 TABLET(10 MG) BY MOUTH AT BEDTIME     Pulmonology:  Leukotriene Inhibitors Passed - 12/12/2020  3:39 AM      Passed - Valid encounter within last 12 months    Recent Outpatient Visits           1 month ago [place], Lauren A, NP   6 months ago Routine general medical examination at a health care facility   [facility], [place], DO   1 year ago Moderate persistent asthma without complication   [facility] Valerie Roys, DO   1 year ago Routine general medical examination at a health care facility   [facility], [place], DO   1 year ago Moderate persistent asthma without complication   [facility], Megan P, DO                 Requested Prescriptions  Pending Prescriptions Disp Refills   montelukast (SINGULAIR) 10 MG tablet [Pharmacy Med Name: MONTELUKAST 10MG  TABLETS] 90 tablet 3    Sig: TAKE 1 TABLET(10 MG) BY MOUTH AT BEDTIME     Pulmonology:  Leukotriene Inhibitors Passed - 12/12/2020  3:39 AM      Passed - Valid encounter within last 12 months    Recent Outpatient Visits           1 month ago [place], Lauren A, NP   6 months ago Routine general medical examination at a health care facility   [facility], [place], DO   1 year ago Moderate persistent asthma without complication   [facility] Valerie Roys, DO   1 year ago Routine general medical examination at a health care facility    [facility], [place], DO   1 year ago Moderate persistent asthma without complication   [place], Megan P, DO

## 2021-01-09 ENCOUNTER — Other Ambulatory Visit: Payer: Self-pay | Admitting: Family Medicine

## 2021-01-09 NOTE — Telephone Encounter (Signed)
Patient called, left VM to return the call to the office to scheduled an appt for medication refill request.   

## 2021-02-05 ENCOUNTER — Other Ambulatory Visit: Payer: Self-pay | Admitting: Family Medicine

## 2021-02-05 NOTE — Telephone Encounter (Signed)
Requested medication (s) are due for refill today: yes  Requested medication (s) are on the active medication list: yes  Last refill:  01/09/21 #30  Future visit scheduled: no  Notes to clinic:  pt overdue for appt- last CPE pt was to follow up and last time med was filled, again pt needed an appointment before further refills. Called pt and LM on VM to call office to schedule appointment. Call back number provided.   Requested Prescriptions  Pending Prescriptions Disp Refills   montelukast (SINGULAIR) 10 MG tablet [Pharmacy Med Name: MONTELUKAST 10MG  TABLETS] 30 tablet 0    Sig: TAKE 1 TABLET(10 MG) BY MOUTH AT BEDTIME     Pulmonology:  Leukotriene Inhibitors Passed - 02/05/2021 11:37 AM      Passed - Valid encounter within last 12 months    Recent Outpatient Visits           3 months ago [place], Lauren A, NP   8 months ago Routine general medical examination at a health care facility   [facility], [place], DO   1 year ago Moderate persistent asthma without complication   [facility] Valerie Roys, DO   1 year ago Routine general medical examination at a health care facility   [facility], [place], DO   1 year ago Moderate persistent asthma without complication   [place], Megan P, DO

## 2021-06-22 ENCOUNTER — Other Ambulatory Visit: Payer: Self-pay | Admitting: Family Medicine

## 2021-06-22 NOTE — Telephone Encounter (Signed)
Requested Prescriptions  ?Pending Prescriptions Disp Refills  ?? albuterol (VENTOLIN HFA) 108 (90 Base) MCG/ACT inhaler [Pharmacy Med Name: ALBUTEROL HFA INH (200 PUFFS) 8.5GM] 8.5 g 5  ?  Sig: INHALE 1 PUFF INTO THE LUNGS EVERY 6 HOURS AS NEEDED FOR WHEEZING OR SHORTNESS OF BREATH  ?  ? Pulmonology:  Beta Agonists 2 Passed - 06/22/2021  3:34 AM  ?  ?  Passed - Last BP in normal range  ?  BP Readings from Last 1 Encounters:  ?06/06/20 113/77  ?   ?  ?  Passed - Last Heart Rate in normal range  ?  Pulse Readings from Last 1 Encounters:  ?06/06/20 89  ?   ?  ?  Passed - Valid encounter within last 12 months  ?  Recent Outpatient Visits   ?      ? 7 months ago COVID-19  ? [place], NP  ? 1 year ago Routine general medical examination at a health care facility  ? [place], [place] P, DO  ? 1 year ago Moderate persistent asthma without complication  ? [place], DO  ? 2 years ago Routine general medical examination at a health care facility  ? [place], [place], DO  ? 2 years ago Moderate persistent asthma without complication  ? [place], [place], DO  ?  ?  ? ?  ?  ?  ? ?

## 2022-01-10 ENCOUNTER — Ambulatory Visit
Admission: EM | Admit: 2022-01-10 | Discharge: 2022-01-10 | Disposition: A | Discharge status: Home / Self Care | Payer: Managed Care, Other (non HMO) | Attending: Family Medicine | Admitting: Family Medicine

## 2022-01-10 ENCOUNTER — Encounter: Payer: Self-pay | Admitting: Emergency Medicine

## 2022-01-10 DIAGNOSIS — J45909 Unspecified asthma, uncomplicated: Secondary | ICD-10-CM | POA: Insufficient documentation

## 2022-01-10 DIAGNOSIS — R058 Other specified cough: Secondary | ICD-10-CM | POA: Insufficient documentation

## 2022-01-10 DIAGNOSIS — Z7951 Long term (current) use of inhaled steroids: Secondary | ICD-10-CM | POA: Insufficient documentation

## 2022-01-10 DIAGNOSIS — R1110 Vomiting, unspecified: Secondary | ICD-10-CM | POA: Insufficient documentation

## 2022-01-10 DIAGNOSIS — Z1152 Encounter for screening for COVID-19: Secondary | ICD-10-CM | POA: Insufficient documentation

## 2022-01-10 DIAGNOSIS — R197 Diarrhea, unspecified: Secondary | ICD-10-CM | POA: Insufficient documentation

## 2022-01-10 DIAGNOSIS — R0789 Other chest pain: Secondary | ICD-10-CM | POA: Insufficient documentation

## 2022-01-10 DIAGNOSIS — J069 Acute upper respiratory infection, unspecified: Secondary | ICD-10-CM | POA: Insufficient documentation

## 2022-01-10 LAB — RESP PANEL BY RT-PCR (FLU A&B, COVID) ARPGX2
Influenza A by PCR: NEGATIVE
Influenza B by PCR: NEGATIVE
SARS Coronavirus 2 by RT PCR: NEGATIVE

## 2022-01-10 LAB — GROUP A STREP BY PCR: Group A Strep by PCR: NOT DETECTED

## 2022-01-10 MED ORDER — PROMETHAZINE-DM 6.25-15 MG/5ML PO SYRP: 5.0000 mL | ORAL_SOLUTION | Freq: Four times a day (QID) | ORAL | 0 refills | Status: AC | PRN

## 2022-01-10 NOTE — Discharge Instructions (Addendum)
Your COVID, influenza or strep test were negative. You likely have a common upper respiratory infection that is caused by a virus.    You can take Tylenol and/or Ibuprofen as needed for fever reduction and pain relief.    For cough: honey 1/2 to 1 teaspoon (you can dilute the honey in water or another fluid).  You can also use guaifenesin and dextromethorphan for cough. You can use a humidifier for chest congestion and cough.  If you don't have a humidifier, you can sit in the bathroom with the hot shower running.      For sore throat: try warm salt water gargles, cepacol lozenges, throat spray, warm tea or water with lemon/honey, popsicles or ice, or OTC cold relief medicine for throat discomfort.    For congestion: take a daily anti-histamine like Zyrtec, Claritin, and a oral decongestant, such as pseudoephedrine.  You can also use Flonase 1-2 sprays in each nostril daily.    It is important to stay hydrated: drink plenty of fluids (water, gatorade/powerade/pedialyte, juices, or teas) to keep your throat moisturized and help further relieve irritation/discomfort.    Return or go to the Emergency Department if symptoms worsen or do not improve in the next few days

## 2022-01-10 NOTE — ED Triage Notes (Signed)
Pt c/o cough, nasal congestion, vomiting, chills, and diarrhea. Started about 2 days ago. Denies fever.

## 2022-01-10 NOTE — ED Provider Notes (Signed)
[facility]    CSN: [id number] Arrival date & time: 01/10/22  0830      History   Chief Complaint Chief Complaint  Patient presents with   Nasal Congestion   Cough    HPI Sheila Benton is a 41 y.o. female.   HPI   Sheila Benton presents for ongoing cough and nasal congestion.  Sore throat started on Monday.  Now she feels like her cough is settling into her chest. Has some chest tightness. She has history of asthma and has been giving herself breathing treatments.    Has been having vomiting and diarrhea.  She took a lot of Mucinex without relief.  Denies any fever, chills, headache and ear pain.  Of note, her husband also has a sore throat.       Past Medical History:  Diagnosis Date   ADD (attention deficit disorder)    Anemia    Anxiety    Asthma    WELL CONTROLLED   Headache    MIGRAINES   History of kidney stones    Irregular menses     Patient Active Problem List   Diagnosis Date Noted   COVID-19 10/28/2020   Impingement syndrome of shoulder region 11/20/2017   Hiatal hernia    Narcolepsy 04/15/2017   Hypersomnia, persistent 01/23/2017   Endometriosis 07/24/2016   Pelvic pain in female 07/24/2016   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   Anxiety disorder 03/10/2015   Asthma 02/10/2015    Past Surgical History:  Procedure Laterality Date   ABDOMINAL HYSTERECTOMY     CYSTOSCOPY  07/24/2016   Procedure: CYSTOSCOPY;  Surgeon: Gae Dry, MD;  Location: [facility];  Service: Gynecology;;   ESOPHAGOGASTRODUODENOSCOPY (EGD) WITH PROPOFOL N/A 09/20/2017   Procedure: ESOPHAGOGASTRODUODENOSCOPY (EGD) WITH PROPOFOL;  Surgeon: Virgel Manifold, MD;  Location: [facility];  Service: Endoscopy;  Laterality: N/A;   LAPAROSCOPIC BILATERAL SALPINGECTOMY Bilateral 07/24/2016   Procedure: LAPAROSCOPIC BILATERAL SALPINGECTOMY;  Surgeon: Gae Dry, MD;  Location: [facility];  Service: Gynecology;  Laterality: Bilateral;   LAPAROSCOPIC  HYSTERECTOMY N/A 07/24/2016   Procedure: HYSTERECTOMY TOTAL LAPAROSCOPIC;  Surgeon: Gae Dry, MD;  Location: [facility];  Service: Gynecology;  Laterality: N/A;   TUBAL LIGATION  12/24/2003    OB History     Gravida  3   Para  3   Term  3   Preterm      AB      Living  3      SAB      IAB      Ectopic      Multiple      Live Births  3            Home Medications    Prior to Admission medications   Medication Sig Start Date End Date Taking? Authorizing Provider  famotidine (PEPCID) 20 MG tablet Take 1 tablet (20 mg total) by mouth at bedtime. 12/07/19  Yes Johnson, Megan P, DO  fluticasone-salmeterol (ADVAIR DISKUS) 250-50 MCG/ACT AEPB Inhale 1 puff into the lungs in the morning and at bedtime. 11/07/20  Yes McElwee, Lauren A, NP  loratadine (CLARITIN) 10 MG tablet Take 10 mg by mouth daily.   Yes [provider]  montelukast (SINGULAIR) 10 MG tablet TAKE 1 TABLET(10 MG) BY MOUTH AT BEDTIME 01/09/21  Yes Johnson, Megan P, DO  omeprazole (PRILOSEC) 20 MG capsule Take 1 capsule (20 mg total) by mouth 2 (two) times daily. 12/07/19 01/10/22 Yes Park Liter  P, DO  promethazine-dextromethorphan (PROMETHAZINE-DM) 6.25-15 MG/5ML syrup Take 5 mLs by mouth 4 (four) times daily as needed. 01/10/22  Yes Adilee Lemme, DO  albuterol (PROVENTIL) (2.5 MG/3ML) 0.083% nebulizer solution VVN Q 6 H PRF WHZ OR SOB 06/06/20   Johnson, Megan P, DO  albuterol (VENTOLIN HFA) 108 (90 Base) MCG/ACT inhaler INHALE 1 PUFF INTO THE LUNGS EVERY 6 HOURS AS NEEDED FOR WHEEZING OR SHORTNESS OF BREATH 06/22/21   Valerie Roys, DO    Family History Family History  Problem Relation Age of Onset   Hypertension Mother    Cancer Father    Endometriosis Sister    Heart disease Maternal Grandmother    Hypertension Maternal Grandmother    Stroke Maternal Grandmother    Lung cancer Maternal Grandfather     Social History Social History   Tobacco Use   Smoking status: Former    Smokeless tobacco: Never  Scientific laboratory technician Use: Every day  Substance Use Topics   Alcohol use: No   Drug use: No     Allergies   Armodafinil, Contrast media [iodinated contrast media], and Omnipaque [iohexol]   Review of Systems Review of Systems: negative unless otherwise stated in HPI.      Physical Exam Triage Vital Signs ED Triage Vitals  Enc Vitals Group     BP 01/10/22 0917 139/78     Pulse Rate 01/10/22 0917 86     Resp 01/10/22 0917 16     Temp 01/10/22 0917 98.3 F (36.8 C)     Temp Source 01/10/22 0917 Oral     SpO2 01/10/22 0917 96 %     Weight 01/10/22 0916 174 lb 6.1 oz (79.1 kg)     Height 01/10/22 0916 5' 5.5" (1.664 m)     Head Circumference --      Peak Flow --      Pain Score 01/10/22 0915 5     Pain Loc --      Pain Edu? --      Excl. in [place]? --    No data found.  Updated Vital Signs BP 139/78 (BP Location: Right Arm)   Pulse 86   Temp 98.3 F (36.8 C) (Oral)   Resp 16   Ht 5' 5.5" (1.664 m)   Wt 79.1 kg   LMP 07/13/2016 (Exact Date) Comment: u preg negative  SpO2 96%   BMI 28.58 kg/m   Visual Acuity Right Eye Distance:   Left Eye Distance:   Bilateral Distance:    Right Eye Near:   Left Eye Near:    Bilateral Near:     Physical Exam GEN:     alert, non-toxic appearing female in no distress     HENT:  mucus membranes moist, oropharyngeal  without lesions or  exudate, no tonsillar hypertrophy,   mild oropharyngeal erythema, clear nasal discharge,   bilateral TM  normal EYES:   pupils equal and reactive, EOMi ,  no scleral injection NECK:  normal ROM, no lymphadenopathy RESP:  no increased work of breathing,  clear to auscultation bilaterally CVS:   regular rate  and rhythm Skin:   warm and dry, no rash on visible skin , normal  skin turgor    UC Treatments / Results  Labs (all labs ordered are listed, but only abnormal results are displayed) Labs Reviewed  RESP PANEL BY RT-PCR (FLU A&B, COVID) ARPGX2  GROUP A STREP  BY PCR    EKG   Radiology No  results found.  Procedures Procedures (including critical care time)  Medications Ordered in UC Medications - No data to display  Initial Impression / Assessment and Plan / UC Course  I have reviewed the triage vital signs and the nursing notes.  Pertinent labs & imaging results that were available during my care of the patient were reviewed by me and considered in my medical decision making (see chart for details).       Pt is a 41 y.o. female who presents for 3 days of respiratory symptoms. Ramsha is  afebrile here without recent antipyretics. Satting well on room air. Overall pt is non-toxic appearing, well hydrated, without respiratory distress. Pulmonary exam is unremarkable.  Strep PCR is  negative. COVID and influenza testing were  negative.Marland Kitchen History consistent with  viral respiratory illness. Discussed symptomatic treatment.  Explained lack of efficacy of antibiotics in viral disease.  Promethazine DM for cough.  Typical duration of symptoms discussed.  Return and ED precautions given and patient voiced understanding.  Discussed MDM, treatment plan and plan for follow-up with patient who agrees with plan.     Final Clinical Impressions(s) / UC Diagnoses   Final diagnoses:  Viral URI with cough     Discharge Instructions      Your COVID, influenza or strep test were negative. You likely have a common upper respiratory infection that is caused by a virus.    You can take Tylenol and/or Ibuprofen as needed for fever reduction and pain relief.    For cough: honey 1/2 to 1 teaspoon (you can dilute the honey in water or another fluid).  You can also use guaifenesin and dextromethorphan for cough. You can use a humidifier for chest congestion and cough.  If you don't have a humidifier, you can sit in the bathroom with the hot shower running.      For sore throat: try warm salt water gargles, cepacol lozenges, throat spray, warm tea or water  with lemon/honey, popsicles or ice, or OTC cold relief medicine for throat discomfort.    For congestion: take a daily anti-histamine like Zyrtec, Claritin, and a oral decongestant, such as pseudoephedrine.  You can also use Flonase 1-2 sprays in each nostril daily.    It is important to stay hydrated: drink plenty of fluids (water, gatorade/powerade/pedialyte, juices, or teas) to keep your throat moisturized and help further relieve irritation/discomfort.    Return or go to the Emergency Department if symptoms worsen or do not improve in the next few days     ED Prescriptions     Medication Sig Dispense Auth. Provider   promethazine-dextromethorphan (PROMETHAZINE-DM) 6.25-15 MG/5ML syrup Take 5 mLs by mouth 4 (four) times daily as needed. 118 mL Lyndee Hensen, DO      PDMP not reviewed this encounter.   Lyndee Hensen, DO 01/10/22 1152

## 2022-03-26 ENCOUNTER — Telehealth: Payer: Self-pay | Admitting: Nurse Practitioner

## 2022-03-26 DIAGNOSIS — J4521 Mild intermittent asthma with (acute) exacerbation: Secondary | ICD-10-CM

## 2022-03-26 DIAGNOSIS — Z20828 Contact with and (suspected) exposure to other viral communicable diseases: Secondary | ICD-10-CM

## 2022-03-26 MED ORDER — OSELTAMIVIR PHOSPHATE 75 MG PO CAPS: 75.0000 mg | ORAL_CAPSULE | Freq: Two times a day (BID) | ORAL | 0 refills | Status: AC

## 2022-03-26 MED ORDER — ALBUTEROL SULFATE (2.5 MG/3ML) 0.083% IN NEBU: 2.5000 mg | INHALATION_SOLUTION | Freq: Four times a day (QID) | RESPIRATORY_TRACT | 1 refills | Status: AC | PRN

## 2022-03-26 MED ORDER — NEBULIZER/TUBING/MOUTHPIECE KIT: 1.0000 | PACK | Freq: Four times a day (QID) | 2 refills | Status: AC

## 2022-03-26 NOTE — Progress Notes (Signed)
Virtual Visit Consent   [name], you are scheduled for a virtual visit with a [facility] provider today. Just as with appointments in the office, your consent must be obtained to participate. Your consent will be active for this visit and any virtual visit you may have with one of our providers in the next 365 days. If you have a MyChart account, a copy of this consent can be sent to you electronically.  As this is a virtual visit, video technology does not allow for your provider to perform a traditional examination. This may limit your provider's ability to fully assess your condition. If your provider identifies any concerns that need to be evaluated in person or the need to arrange testing (such as labs, EKG, etc.), we will make arrangements to do so. Although advances in technology are sophisticated, we cannot ensure that it will always work on either your end or our end. If the connection with a video visit is poor, the visit may have to be switched to a telephone visit. With either a video or telephone visit, we are not always able to ensure that we have a secure connection.  By engaging in this virtual visit, you consent to the provision of healthcare and authorize for your insurance to be billed (if applicable) for the services provided during this visit. Depending on your insurance coverage, you may receive a charge related to this service.  I need to obtain your verbal consent now. Are you willing to proceed with your visit today? [name] has provided verbal consent on 03/26/2022 for a virtual visit (video or telephone). [name], FNP  Date: 03/26/2022 8:07 AM  Virtual Visit via Video Note   I, [name], connected with  [name]  ([id number], 1980-04-06) on 03/26/22 at  9:00 AM EST by a video-enabled telemedicine application and verified that I am speaking with the correct person using two identifiers.  Location: Patient: Virtual Visit Location Patient:  Home Provider: Virtual Visit Location Provider: Home Office   I discussed the limitations of evaluation and management by telemedicine and the availability of in person appointments. The patient expressed understanding and agreed to proceed.    History of Present Illness: Sheila Benton is a 42 y.o. who identifies as a female who was assigned female at birth, and is being seen today for flu like symptoms.  She started to feel ill 1.5 days ago   Has chest congestion and hurts to cough she has had a fever 101.2 She has been drinking and eating   Her son was diagnosed with Flu B   She has borderline COPD  Has a rescue inhaler(Albuterol) and a Adviar  Also has a nebulizer   Problems:  Patient Active Problem List   Diagnosis Date Noted   COVID-19 10/28/2020   Impingement syndrome of shoulder region 11/20/2017   Hiatal hernia    Narcolepsy 04/15/2017   Hypersomnia, persistent 01/23/2017   Endometriosis 07/24/2016   Pelvic pain in female 07/24/2016   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   Anxiety disorder 03/10/2015   Asthma 02/10/2015    Allergies:  Allergies  Allergen Reactions   Armodafinil Other (See Comments)    "Shoulder ticks, back pain, increased anxiety, keeps me awake"   Contrast Media [Iodinated Contrast Media] Rash   Omnipaque [Iohexol] Itching    Patient approximately 5 minutes after injection sneezed once and then started itching uncontrollably all over. SPM   Medications:  Current Outpatient Medications:  albuterol (PROVENTIL) (2.5 MG/3ML) 0.083% nebulizer solution, VVN Q 6 H PRF WHZ OR SOB, Disp: 75 mL, Rfl: 3   albuterol (VENTOLIN HFA) 108 (90 Base) MCG/ACT inhaler, INHALE 1 PUFF INTO THE LUNGS EVERY 6 HOURS AS NEEDED FOR WHEEZING OR SHORTNESS OF BREATH, Disp: 8.5 g, Rfl: 5   famotidine (PEPCID) 20 MG tablet, Take 1 tablet (20 mg total) by mouth at bedtime., Disp: 90 tablet, Rfl: 1   fluticasone-salmeterol (ADVAIR DISKUS) 250-50 MCG/ACT AEPB,  Inhale 1 puff into the lungs in the morning and at bedtime., Disp: 60 each, Rfl: 1   loratadine (CLARITIN) 10 MG tablet, Take 10 mg by mouth daily., Disp: , Rfl:    montelukast (SINGULAIR) 10 MG tablet, TAKE 1 TABLET(10 MG) BY MOUTH AT BEDTIME, Disp: 30 tablet, Rfl: 0   omeprazole (PRILOSEC) 20 MG capsule, Take 1 capsule (20 mg total) by mouth 2 (two) times daily., Disp: 60 capsule, Rfl: 1   promethazine-dextromethorphan (PROMETHAZINE-DM) 6.25-15 MG/5ML syrup, Take 5 mLs by mouth 4 (four) times daily as needed., Disp: 118 mL, Rfl: 0  Observations/Objective: Patient is well-developed, well-nourished in no acute distress.  Resting comfortably  at home.  Head is normocephalic, atraumatic.  No labored breathing.  Speech is clear and coherent with logical content.  Patient is alert and oriented at baseline.    Assessment and Plan: 1. Exposure to the flu  - oseltamivir (TAMIFLU) 75 MG capsule; Take 1 capsule (75 mg total) by mouth 2 (two) times daily for 5 days.  Dispense: 10 capsule; Refill: 0  2. Mild intermittent asthma with acute exacerbation  - albuterol (PROVENTIL) (2.5 MG/3ML) 0.083% nebulizer solution; Take 3 mLs (2.5 mg total) by nebulization every 6 (six) hours as needed for wheezing or shortness of breath.  Dispense: 150 mL; Refill: 1 - Respiratory Therapy Supplies (NEBULIZER/TUBING/MOUTHPIECE) KIT; 1 kit by Does not apply route 4 (four) times daily.  Dispense: 1 kit; Refill: 2     Follow Up Instructions: I discussed the assessment and treatment plan with the patient. The patient was provided an opportunity to ask questions and all were answered. The patient agreed with the plan and demonstrated an understanding of the instructions.  A copy of instructions were sent to the patient via MyChart unless otherwise noted below.    The patient was advised to call back or seek an in-person evaluation if the symptoms worsen or if the condition fails to improve as anticipated.  Time:  I  spent 15 minutes with the patient via telehealth technology discussing the above problems/concerns.    [name], FNP

## 2022-04-27 ENCOUNTER — Telehealth: Payer: Self-pay

## 2022-04-27 NOTE — Transitions of Care (Post Inpatient/ED Visit) (Unsigned)
   04/27/2022  Name: Sheila Benton MRN: [id number] DOB: 1981-01-12  Today's TOC FU Call Status: Today's TOC FU Call Status:: Unsuccessul Call (1st Attempt) Unsuccessful Call (1st Attempt) Date: 04/27/22  Attempted to reach the patient regarding the most recent Inpatient/ED visit.  Follow Up Plan: Additional outreach attempts will be made to reach the patient to complete the Transitions of Care (Post Inpatient/ED visit) call.   Signature Juanda Crumble, [place] Direct Dial [phone]

## 2022-05-01 NOTE — Transitions of Care (Post Inpatient/ED Visit) (Signed)
   05/01/2022  Name: Sheila Benton MRN: [id number] DOB: April 09, 1980  Today's TOC FU Call Status: Today's TOC FU Call Status:: Unsuccessful Call (2nd Attempt) Unsuccessful Call (1st Attempt) Date: 04/27/22 Unsuccessful Call (2nd Attempt) Date: 05/01/22  Attempted to reach the patient regarding the most recent Inpatient/ED visit.  Follow Up Plan: No further outreach attempts will be made at this time. We have been unable to contact the patient.  Signature Juanda Crumble, [place] Direct Dial [phone]
